# Patient Record
Sex: Female | Born: 1940
Health system: Southern US, Community
[De-identification: ages and names within clinical notes are randomized; demographics above are authoritative.]

## PROBLEM LIST (undated history)

## (undated) DIAGNOSIS — M1990 Unspecified osteoarthritis, unspecified site: Secondary | ICD-10-CM

## (undated) DIAGNOSIS — E785 Hyperlipidemia, unspecified: Secondary | ICD-10-CM

## (undated) DIAGNOSIS — IMO0002 Reserved for concepts with insufficient information to code with codable children: Secondary | ICD-10-CM

## (undated) DIAGNOSIS — C541 Malignant neoplasm of endometrium: Secondary | ICD-10-CM

## (undated) DIAGNOSIS — Z923 Personal history of irradiation: Secondary | ICD-10-CM

## (undated) DIAGNOSIS — I82409 Acute embolism and thrombosis of unspecified deep veins of unspecified lower extremity: Secondary | ICD-10-CM

## (undated) DIAGNOSIS — K219 Gastro-esophageal reflux disease without esophagitis: Secondary | ICD-10-CM

## (undated) DIAGNOSIS — G43909 Migraine, unspecified, not intractable, without status migrainosus: Secondary | ICD-10-CM

## (undated) DIAGNOSIS — F419 Anxiety disorder, unspecified: Secondary | ICD-10-CM

## (undated) DIAGNOSIS — I10 Essential (primary) hypertension: Secondary | ICD-10-CM

## (undated) HISTORY — DX: Hyperlipidemia, unspecified: E78.5

## (undated) HISTORY — PX: CHOLECYSTECTOMY: SHX55

## (undated) HISTORY — DX: Unspecified osteoarthritis, unspecified site: M19.90

## (undated) HISTORY — DX: Acute embolism and thrombosis of unspecified deep veins of unspecified lower extremity: I82.409

## (undated) HISTORY — DX: Migraine, unspecified, not intractable, without status migrainosus: G43.909

## (undated) HISTORY — DX: Malignant neoplasm of endometrium: C54.1

## (undated) HISTORY — DX: Gastro-esophageal reflux disease without esophagitis: K21.9

## (undated) HISTORY — DX: Personal history of irradiation: Z92.3

## (undated) HISTORY — PX: EYE SURGERY: SHX253

---

## 1999-03-20 ENCOUNTER — Encounter: Payer: Self-pay | Admitting: Cardiology

## 1999-03-20 ENCOUNTER — Inpatient Hospital Stay (HOSPITAL_COMMUNITY): Admission: EM | Admit: 1999-03-20 | Discharge: 1999-03-27 | Payer: Self-pay | Admitting: Emergency Medicine

## 1999-03-20 ENCOUNTER — Encounter: Payer: Self-pay | Admitting: Emergency Medicine

## 1999-03-26 ENCOUNTER — Encounter: Payer: Self-pay | Admitting: Endocrinology

## 1999-08-16 ENCOUNTER — Other Ambulatory Visit: Admission: RE | Admit: 1999-08-16 | Discharge: 1999-08-16 | Payer: Self-pay | Admitting: Obstetrics and Gynecology

## 2000-08-15 ENCOUNTER — Other Ambulatory Visit: Admission: RE | Admit: 2000-08-15 | Discharge: 2000-08-15 | Payer: Self-pay | Admitting: Obstetrics and Gynecology

## 2000-09-04 ENCOUNTER — Encounter: Payer: Self-pay | Admitting: Obstetrics and Gynecology

## 2000-09-04 ENCOUNTER — Encounter: Admission: RE | Admit: 2000-09-04 | Discharge: 2000-09-04 | Payer: Self-pay | Admitting: Obstetrics and Gynecology

## 2000-12-06 ENCOUNTER — Encounter: Payer: Self-pay | Admitting: *Deleted

## 2000-12-06 ENCOUNTER — Ambulatory Visit (HOSPITAL_COMMUNITY): Admission: RE | Admit: 2000-12-06 | Discharge: 2000-12-06 | Payer: Self-pay | Admitting: *Deleted

## 2000-12-09 ENCOUNTER — Ambulatory Visit (HOSPITAL_COMMUNITY): Admission: RE | Admit: 2000-12-09 | Discharge: 2000-12-09 | Payer: Self-pay | Admitting: *Deleted

## 2000-12-09 ENCOUNTER — Encounter (INDEPENDENT_AMBULATORY_CARE_PROVIDER_SITE_OTHER): Payer: Self-pay | Admitting: *Deleted

## 2000-12-25 ENCOUNTER — Encounter (INDEPENDENT_AMBULATORY_CARE_PROVIDER_SITE_OTHER): Payer: Self-pay | Admitting: Specialist

## 2000-12-25 ENCOUNTER — Observation Stay (HOSPITAL_COMMUNITY): Admission: RE | Admit: 2000-12-25 | Discharge: 2000-12-26 | Payer: Self-pay | Admitting: General Surgery

## 2001-10-21 ENCOUNTER — Other Ambulatory Visit: Admission: RE | Admit: 2001-10-21 | Discharge: 2001-10-21 | Payer: Self-pay | Admitting: Obstetrics and Gynecology

## 2004-04-03 ENCOUNTER — Ambulatory Visit (HOSPITAL_COMMUNITY): Admission: RE | Admit: 2004-04-03 | Discharge: 2004-04-03 | Payer: Self-pay | Admitting: *Deleted

## 2004-04-03 ENCOUNTER — Encounter (INDEPENDENT_AMBULATORY_CARE_PROVIDER_SITE_OTHER): Payer: Self-pay | Admitting: *Deleted

## 2006-04-29 ENCOUNTER — Encounter: Admission: RE | Admit: 2006-04-29 | Discharge: 2006-04-29 | Payer: Self-pay | Admitting: Gastroenterology

## 2007-09-04 HISTORY — PX: TOTAL ABDOMINAL HYSTERECTOMY W/ BILATERAL SALPINGOOPHORECTOMY: SHX83

## 2008-01-23 ENCOUNTER — Ambulatory Visit (HOSPITAL_COMMUNITY): Admission: RE | Admit: 2008-01-23 | Discharge: 2008-01-23 | Payer: Self-pay | Admitting: Obstetrics and Gynecology

## 2008-02-04 ENCOUNTER — Inpatient Hospital Stay (HOSPITAL_COMMUNITY): Admission: RE | Admit: 2008-02-04 | Discharge: 2008-02-06 | Payer: Self-pay | Admitting: Obstetrics and Gynecology

## 2008-02-04 ENCOUNTER — Encounter (INDEPENDENT_AMBULATORY_CARE_PROVIDER_SITE_OTHER): Payer: Self-pay | Admitting: Obstetrics and Gynecology

## 2009-10-10 ENCOUNTER — Encounter: Admission: RE | Admit: 2009-10-10 | Discharge: 2009-10-10 | Payer: Self-pay | Admitting: Endocrinology

## 2009-11-08 ENCOUNTER — Other Ambulatory Visit: Admission: RE | Admit: 2009-11-08 | Discharge: 2009-11-08 | Payer: Self-pay | Admitting: Gastroenterology

## 2010-09-24 ENCOUNTER — Encounter: Payer: Self-pay | Admitting: Gastroenterology

## 2010-09-25 ENCOUNTER — Ambulatory Visit (HOSPITAL_COMMUNITY)
Admission: RE | Admit: 2010-09-25 | Discharge: 2010-09-25 | Payer: Self-pay | Source: Home / Self Care | Attending: Family Medicine | Admitting: Family Medicine

## 2010-09-26 LAB — CREATININE, SERUM
Creatinine, Ser: 0.86 mg/dL (ref 0.4–1.2)
GFR calc Af Amer: >60 mL/min (ref >60)
GFR calc non Af Amer: >60 mL/min (ref >60)

## 2010-09-26 LAB — BUN: BUN: 11 mg/dL (ref 6–23)

## 2010-10-10 ENCOUNTER — Other Ambulatory Visit: Payer: Self-pay | Admitting: Endocrinology

## 2010-10-10 ENCOUNTER — Ambulatory Visit
Admission: RE | Admit: 2010-10-10 | Discharge: 2010-10-10 | Disposition: A | Discharge status: Home / Self Care | Payer: Medicare Other | Source: Ambulatory Visit | Attending: Endocrinology | Admitting: Endocrinology

## 2010-10-10 DIAGNOSIS — R030 Elevated blood-pressure reading, without diagnosis of hypertension: Secondary | ICD-10-CM

## 2011-01-16 NOTE — Op Note (Signed)
NAMEADRIE, [name]                ACCOUNT NO.:  [id number]   MEDICAL RECORD NO.:  [id number]          PATIENT TYPE:  INP   LOCATION:  9309                          FACILITY:  [facility]   PHYSICIAN:  Malachi Pro. Ambrose Mantle, M.D. DATE OF BIRTH:  12/27/1940   DATE OF PROCEDURE:  02/04/2008  DATE OF DISCHARGE:                               OPERATIVE REPORT   PREOPERATIVE DIAGNOSIS:  Endometrial carcinoma, FIGO grade 1.   POSTOPERATIVE DIAGNOSIS:  No evidence of deep myometrial invasion.   OPERATION:  Abdominal hysterectomy, bilateral salpingo-oophorectomy,  pelvic washings.   OPERATOR:  Malachi Pro. Ambrose Mantle, MD   ASSISTANT:  Zenaida Niece, MD.   ANESTHESIA:  General.   The patient was brought to the operating room and given general  anesthesia.  She was placed in frog-leg position.  The abdomen was  prepped all the way to the xiphoid for midline incision.  The prep was  done with Betadine solution.  The vagina was prepped.  The urethra was  prepped and a Foley catheter was inserted to straight drain.  The  patient was then placed supine.  The abdomen was draped as a sterile  field.  Because there was a possibility that lymph node dissection would  be necessary, a midline incision was made and carried in layers through  the skin, subcutaneous tissue, and fascia.  The fascia was then divided  in the midline.  Posterior sheath was divided.  The peritoneum was  opened vertically.  Exploration of the upper abdomen revealed the liver  to be smooth.  The gallbladder was absent.  The lower pole of both  kidneys were felt to be normal.  There was no sign of any intra-  abdominal metastasis.  There was no free fluid.  Pelvic washings were  obtained and preserved.  Packs were used to try to prepare the operative  field and after I placed about 3 packs, the patient sustained a  bradycardia down to about 38 beats a minute.  She was given atropine.  The packing was stopped, and for several minutes, we  waited the  heartbeat to return into the mid 50s, Dr. Tacy Dura then gave the  permission to proceed with the packing.  Remainder of the packing was  done.  The uterus was anterior normal sized.  Kelly clamps were placed  across both upper pedicles.  The round ligaments were divided with the  Bovie, and bladder flap was developed.  The infundibulopelvic ligaments  were isolated with a right angle clamp and then divided between clamps  and doubly suture ligated.  The uterine vessels bilaterally were  skeletonized, clamped, cut, and suture ligated.  Parametrium tissues  were clamped, cut, and suture ligated.  Uterosacral ligaments were  clamped, cut, suture ligated, and held, and the clamps were then placed  across the upper portion of the vagina and the uterus, tubes, and  ovaries were removed as a surgical specimen.  Prior to closure of the  vagina, there was dark bloody material that had come from the inside of  the uterus.  After vaginal angle sutures were  placed and tied, the  central portion of the vagina was reapproximated with figure-of-eight  sutures of 0 Vicryl.  There was no significant bleeding.  A couple spots  needed to be touched with the Bovie.  Uterosacral ligaments were sutured  together in the midline.  Liberal irrigation confirmed hemostasis.  I  palpated both ureters, they were normal and Dr. Dierdre Searles was called from  [facility] to come do a frozen section after the specimen had been  removed.  All packs and retractors were removed.  The abdominal wall was  then closed with interrupted Marcello Moores sutures of #1 Novofil  incorporating the subcutaneous tissue, the rectus muscle, the fascia,  and the peritoneum in the first layer, and then the second layer with  just the fascia.  After these were placed and tied, there was several  gaps in the fascia that I reenforced with interrupted sutures of #1  Novofil.  At one point during the closure, I did touch the surface of  the bowel  with the #1 Novofil needle, but I inspected it thoroughly and  there was no entry into the bowel lumen, and there was no significant  serosal tear.  I liberally irrigated the subcutaneous tissue, closed it  with a running 3-0 Vicryl suture and the skin was reapproximated with  the staples.  Dr. Dierdre Searles had called prior to the finishing the closure and  stated that in the 2 slides that she actually looked at, there was no  evidence of true endometrial cancer, just complex endometrial  hyperplasia, but at the very least, there was no deep myometrial  invasion, so I called Dr. Georgina Pillion and told him that we did not need the  node dissection.  Procedure was terminated, and the patient was returned  to recovery in satisfactory condition.  Sponge and needle counts were  correct.  Blood loss was thought to be about less than 100 mL.      Malachi Pro. Ambrose Mantle, M.D.  Electronically Signed     TFH/MEDQ  D:  02/04/2008  T:  02/05/2008  Job:  [id number]   cc:   Veverly Fells. Altheimer, M.D.  Fax: [phone]

## 2011-01-16 NOTE — H&P (Signed)
[name], [name]                ACCOUNT NO.:  [id number]   MEDICAL RECORD NO.:  [id number]           PATIENT TYPE:   LOCATION:                                 FACILITY:   PHYSICIAN:  [name], M.D. DATE OF BIRTH:  1941-08-21   DATE OF ADMISSION:  02/04/2008  DATE OF DISCHARGE:                              HISTORY & PHYSICAL   DATE OF ADMISSION:  February 04, 2008.   This is a 70 year old, white, single female, para 0, who is admitted to  the hospital for abdominal hysterectomy, bilateral salpingo-  oophorectomy, possible lymph node dissection, because of endometrial  adenocarcinoma.  This patient has had no postmenopausal bleeding.  She  came to my office on Jan 06, 2008 for a yearly exam, and she stated that  from March 18 to April 20, she had had lower abdominal and back pain,  and she felt like her bottom was falling out.  She said the pain had  improved.  She had not had sexual intercourse.  Examination revealed a  significant cystocele.  The uterus was hard to feel.  It was thought to  be small.  I advised a pelvic ultrasound, which she underwent in our  office on Jan 09, 2008.  This showed normal ovaries bilaterally, normal  sized uterus with a large component in the endometrial cavity consistent  with fluid.  There was an echogenic soft tissue vascular mass seen along  the posterior interior cavity that measured 3 x 2.6 x 1.6 cm.  There  appeared to be small calcifications within the mass.  There were two  other soft tissue foci on the posterior wall of the uterus measuring 14  x 5 and 10 x 4 mm.  There was no fluid present.  The patient returned to  my office on Jan 13, 2008 for an endometrial biopsy.  The cervix was  stenotic, but it was dilated with a 7, 8 and 9-10 dilators.  Approximately 15 Pipelle with what appeared to be purulent material were  removed and then a Vabra aspiration of the endometrial cavity was done.  Culture of the material was sent and I placed her  on doxicycline 100 mg  twice daily.  The pathology report returned as endometrioid  adenocarcinoma with extensive necrosis, FIGO grade I.  I went over the  material with [name] at [name], and instead of the fluid  being a pyometra, it was actually necrotic fluid.  Because the patient  had had cervical stenosis and had not had any bleeding, I advised her to  be evaluated with a CT scan to see if there was evidence of  intraabdominal spread.  CT scan of the abdomen and pelvis showed no  acute intraabdominal finding, no intraabdominal metastasis was  identified, no lymphadenopathy was seen.  There was no evidence of  ascites.  A high density material in the uterine fundus might represent  a calcified fibroid.  There was a prominent fluid density within the  endometrial canal.  The ovaries were unremarkable.  Large bowel, small  bowel and appendix  were normal.  No lytic lesions were noted.  Because  the patient has known endometrial cancer, she is a candidate for lymph  node dissection if the cancer is spread more than halfway through the  myometrium.  I have therefore asked [name] to be available to do  a pelvic and paraaortic lymphadenectomy if indicated.  I offered to him  to see the patient preoperatively, but he stated that he did not think  that was necessary.   PAST MEDICAL HISTORY:  Reveals no known allergies.  She is not allergic  to latex.  In approximately 2000, she broke her leg and later developed  a pulmonary embolus.  She was treated with Coumadin for some time, but  has been off Coumadin for a long time.  She also has osteoporosis.   OPERATIONS:  The patient had her gallbladder removed in 2001.   She does not drink.  She does not smoke.  She is retired from the tax  department.   FAMILY HISTORY:  Mother died from congestive heart failure.  Father had  a bleeding ulcer.  A brother has high blood pressure at age 22.   MEDICATIONS:  1. Actonel 35 mg  daily.  2. Crestor 20 mg daily.  3. Glucosamine 1500 mg daily.  4. Inderal LA 120 mg daily.  5. Caltrate and vitamin D.  6. Oxycodone as needed for migraines.   PHYSICAL EXAMINATION:  Well developed, well nourished, white female in  no distress.  Blood pressure is 140/90, pulse is 60.  Weight is 182  pounds.  HEAD, EYES, EARS, NOSE & THROAT:  Reveal no cranial abnormalities.  Extraocular movements are intact.  Nose and pharynx are clear.  NECK:  Supple without thyromegaly.  HEART:  Normal size and sounds.  No murmurs.  LUNGS:  Clear to auscultation.  BREASTS:  Soft without masses.  ABDOMEN:  Soft, not distended.  It is obese.  Liver, spleen and kidneys  are not felt.  No masses are palpable.  Pap smear on Jan 06, 2008 was normal.  The vulva and vagina are clean.  There is a third degree cystocele.  The uterine cervix is without  lesions.  The uterus is difficult to feel, but is thought to be anterior  and normal size.  The adnexa are free of masses.  RECTAL EXAM:  Negative.   ADMITTING IMPRESSION:  1. Endometrial adenocarcinoma.  2. Osteoporosis.  3. History of pulmonary embolism.  4. Migraine headaches.   The patient is admitted for abdominal hysterectomy, bilateral salpingo-  oophorectomy and possible pelvic and para-aortic lymphadenectomy.  She  has been informed of the risks of surgery including, but not limited to,  heart attack, stroke, pulmonary embolus, wound disruption, hemorrhage  with need for reoperation and/or transfusion, fistula formation, nerve  injury, intestinal obstruction.  She has been advised that we will use  low dose heparin, TED hose, pulsatile antiembolism stockings and early  ambulation.  She understands and agrees to proceed.      [name], M.D.  Electronically Signed     TFH/MEDQ  D:  02/03/2008  T:  02/03/2008  Job:  [id number]   cc:   [name], M.D.  Fax: [phone]

## 2011-01-16 NOTE — Discharge Summary (Signed)
[name], STEINHILBER                ACCOUNT NO.:  [id number]   MEDICAL RECORD NO.:  [id number]          PATIENT TYPE:  INP   LOCATION:  9309                          FACILITY:  [facility]   PHYSICIAN:  Malachi Pro. Ambrose Mantle, M.D. DATE OF BIRTH:  11-06-1940   DATE OF ADMISSION:  02/04/2008  DATE OF DISCHARGE:  02/06/2008                               DISCHARGE SUMMARY   HISTORY OF PRESENT ILLNESS:  This is a 70 year old white female who was  admitted to the hospital for surgical removal of the uterus, tubes, and  ovaries because of endometrial carcinoma.  Dr. Brunilda Payor was available in  case a lymph node dissection was necessary.  The patient underwent the  abdominal hysterectomy and bilateral salpingo-oophorectomy by Dr. Ambrose Mantle  with Dr. Meisinger's assisting under general anesthesia.  Blood loss was  less than 100 mL.  The frozen section report by Dr. Dierdre Searles was there was no  definite remaining endometrial carcinoma, but at the very least, there  was no myometrial invasion.  Postoperatively, the patient did well.  She  had no significant temperature elevation.  Highest temperature was  100.1.  On the morning of the first postop day, she tolerated a regular  diet.  She ambulated without difficulty and on the second postop day was  discharged.  Her incision is somewhat ecchymotic, but it is healing  satisfactorily.  Her abdomen is soft and nontender.   LABORATORY DATA:  Initial hemoglobin of 14.6, hematocrit 42.1, white  count 7500, and platelet count 215,000.  Followup hemoglobin is 14.1 and  13.8.  Differential was normal.  Comprehensive metabolic profile was  normal except for glucose of 100, alkaline phosphatase of 33, and CA-125  was 15.5.  Estimated glomerular filtration rate was greater than 60.  Path report showed peritoneal washings negative for malignant cells.  There was endometrial cancer residual in the hysterectomy specimen, but  the greatest depth of penetration was 2 mm when the uterine  myometrial  wall was greater than 1 cm in thickness.  Therefore, the tumor is  categorized as an endometrial carcinoma stage IB.   FINAL DIAGNOSES:  1. Endometrial adenocarcinoma stage IB.  2. Osteoporosis.  3. History of pulmonary embolism.  4. Migraine headaches.   OPERATION:  1. Abdominal hysterectomy.  2. Bilateral salpingo-oophorectomy.   FINAL CONDITION:  Improved.   INSTRUCTIONS:  No vaginal entrance.  No heavy lifting or strenuous  activity.  Call with any temperature elevation greater than 100.4  degrees.  Call with any heavy vaginal bleeding or with any unusual  problems.  I have advised the patient that if she has not passed flatus  in 2 days, she should take a suppository.  She is to return to see me in  1 week for follow up examination.      Malachi Pro. Ambrose Mantle, M.D.  Electronically Signed     TFH/MEDQ  D:  02/06/2008  T:  02/06/2008  Job:  [id number]   cc:   Veverly Fells. Altheimer, M.D.  Fax: [phone]

## 2011-01-19 NOTE — Op Note (Signed)
[facility]  Patient:    [name], [name]                       MRN: [id number] Proc. Date: 12/25/00 Adm. Date:  [id number] Attending:  Carson Myrtle                           Operative Report  PREOPERATIVE DIAGNOSES:  Chronic cholecystolithiasis.  POSTOPERATIVE DIAGNOSES:  Chronic cholecystolithiasis.  PROCEDURE:  Laparoscopic cholecystectomy.  SURGEON:  Kendrick Ranch, M.D.  ANESTHESIA:  General.  ASSISTANT:  Ginette Pitman, M.D.  HISTORY OF PRESENT ILLNESS:  This is a 70 year old Caucasian female with chronic cholecystitis frequent food related attacks of right upper quadrant pain and nausea. Ultrasound confirms gallstones, liver function studies normal. She is anxious to proceed with a laparoscopic cholecystectomy as it has been carefully explained.  DESCRIPTION OF PROCEDURE:  The patient is brought to the operating room and placed supine. General endotracheal anesthesia administered. The abdomen scrubbed, prepped and draped in the usual fashion. Marcaine 0.5% with epinephrine was used prior to each surgical incision. A horizontal infraumbilical incision, the fascia identified, opened vertically. A small periumbilical hernia was also noted and included in the incision. A suture was placed. The peritoneum entered without complications. A Hasson catheter placed, tied in place, the abdomen insufflated. General peritoneoscopy was essentially negative. Questionable abnormality in the left lower quadrant was noted. A second 10 mm trocar was placed in the mid epigastrium, two 5 mm trocars in the right upper quadrant. Instruments were applied. The patient was placed in the head down position and the pelvis was inspected. The questionable abnormality was a hydrosalpinx on the left, a normal appearing ovary on the right. The patient was then repositioned and attention was turned to the gallbladder. It was chronically inflamed and of a cork screw  general anatomy. It was grasped and straightened and then appeared normal. Its base was carefully explored. The cystic duct and artery were identified clearly. There were no other structures. The cystic duct was triply and divided, likewise the artery triply clipped and divided. The gallbladder was then removed from the gallbladder bed without incident or complication. There was no bile or no blood. Irrigant was clear. All areas inspected and were found to be negative for problems. The gallbladder was then removed through the infraumbilical incision. Again the area inspected. All irrigant, CO2, instruments and trocars removed. At the infraumbilical incision, a second suture was then carefully placed in the fascia in the area of the small hernia. This snugly closed the fascia. Additional local anesthesia was placed in the infraumbilical area and then all skin incisions were closed with 3-0 monocryl. Steri-Strips and dry sterile dressings applied. She tolerated it well and was removed to the recovery room in good condition. DD:  12/25/00 TD:  12/25/00 Job: [id number]

## 2011-05-28 ENCOUNTER — Other Ambulatory Visit (HOSPITAL_COMMUNITY): Payer: Self-pay | Admitting: Obstetrics and Gynecology

## 2011-05-28 DIAGNOSIS — C541 Malignant neoplasm of endometrium: Secondary | ICD-10-CM

## 2011-05-31 LAB — CBC
HCT: 40.4
HCT: 40.5
HCT: 42.1
Hemoglobin: 13.8
Hemoglobin: 14.1
MCHC: 34.2
MCHC: 34.6
MCV: 96.3
MCV: 97.4
Platelets: 215
RDW: 12.9
WBC: 16 — ABNORMAL HIGH
WBC: 7.5

## 2011-05-31 LAB — COMPREHENSIVE METABOLIC PANEL
Albumin: 3.8
BUN: 10
Calcium: 9
Chloride: 105
Creatinine, Ser: 0.73
GFR calc non Af Amer: >60
Total Bilirubin: 0.9

## 2011-05-31 LAB — DIFFERENTIAL
Basophils Absolute: 0
Lymphocytes Relative: 36
Monocytes Absolute: 0.3
Neutro Abs: 4.4

## 2011-06-04 ENCOUNTER — Encounter (HOSPITAL_COMMUNITY)
Admission: RE | Admit: 2011-06-04 | Discharge: 2011-06-04 | Disposition: A | Discharge status: Home / Self Care | Payer: Medicare Other | Source: Ambulatory Visit | Attending: Obstetrics and Gynecology | Admitting: Obstetrics and Gynecology

## 2011-06-04 DIAGNOSIS — C541 Malignant neoplasm of endometrium: Secondary | ICD-10-CM

## 2011-06-04 DIAGNOSIS — Z90710 Acquired absence of both cervix and uterus: Secondary | ICD-10-CM | POA: Insufficient documentation

## 2011-06-04 DIAGNOSIS — M4120 Other idiopathic scoliosis, site unspecified: Secondary | ICD-10-CM | POA: Insufficient documentation

## 2011-06-04 DIAGNOSIS — R599 Enlarged lymph nodes, unspecified: Secondary | ICD-10-CM | POA: Insufficient documentation

## 2011-06-04 DIAGNOSIS — M479 Spondylosis, unspecified: Secondary | ICD-10-CM | POA: Insufficient documentation

## 2011-06-04 DIAGNOSIS — Z79899 Other long term (current) drug therapy: Secondary | ICD-10-CM | POA: Insufficient documentation

## 2011-06-04 DIAGNOSIS — C499 Malignant neoplasm of connective and soft tissue, unspecified: Secondary | ICD-10-CM | POA: Insufficient documentation

## 2011-06-04 MED ORDER — FLUDEOXYGLUCOSE F - 18 (FDG) INJECTION
19.8000 | Freq: Once | INTRAVENOUS | Status: AC | PRN
  Administered 2011-06-04: 19.8 via INTRAVENOUS

## 2011-06-08 ENCOUNTER — Other Ambulatory Visit: Payer: Self-pay | Admitting: Gynecology

## 2011-06-08 ENCOUNTER — Ambulatory Visit: Payer: Medicare Other | Attending: Gynecology | Admitting: Gynecology

## 2011-06-08 DIAGNOSIS — Z8542 Personal history of malignant neoplasm of other parts of uterus: Secondary | ICD-10-CM | POA: Insufficient documentation

## 2011-06-08 DIAGNOSIS — C775 Secondary and unspecified malignant neoplasm of intrapelvic lymph nodes: Secondary | ICD-10-CM | POA: Insufficient documentation

## 2011-06-08 DIAGNOSIS — Z90710 Acquired absence of both cervix and uterus: Secondary | ICD-10-CM | POA: Insufficient documentation

## 2011-06-08 DIAGNOSIS — C7982 Secondary malignant neoplasm of genital organs: Secondary | ICD-10-CM | POA: Insufficient documentation

## 2011-06-08 DIAGNOSIS — E785 Hyperlipidemia, unspecified: Secondary | ICD-10-CM | POA: Insufficient documentation

## 2011-06-08 DIAGNOSIS — Z9079 Acquired absence of other genital organ(s): Secondary | ICD-10-CM | POA: Insufficient documentation

## 2011-06-08 DIAGNOSIS — Z9089 Acquired absence of other organs: Secondary | ICD-10-CM | POA: Insufficient documentation

## 2011-06-08 LAB — CBC
Hemoglobin: 13.6 g/dL (ref 12.0–15.0)
MCH: 32.1 pg (ref 26.0–34.0)
MCHC: 33.4 g/dL (ref 30.0–36.0)
Platelets: 225 10*3/uL (ref 150–400)

## 2011-06-08 LAB — DIFFERENTIAL
Basophils Relative: 1 % (ref 0–1)
Eosinophils Absolute: 0.1 10*3/uL (ref 0.0–0.7)
Monocytes Absolute: 0.4 10*3/uL (ref 0.1–1.0)
Monocytes Relative: 6 % (ref 3–12)

## 2011-06-08 LAB — COMPREHENSIVE METABOLIC PANEL
ALT: 17 U/L (ref 0–35)
Calcium: 9.6 mg/dL (ref 8.4–10.5)
GFR calc Af Amer: >90 mL/min (ref >90)
Glucose, Bld: 98 mg/dL (ref 70–99)
Sodium: 138 mEq/L (ref 135–145)
Total Protein: 6.3 g/dL (ref 6.0–8.3)

## 2011-06-11 ENCOUNTER — Ambulatory Visit
Admission: RE | Admit: 2011-06-11 | Discharge: 2011-06-11 | Disposition: A | Discharge status: Home / Self Care | Payer: Medicare Other | Source: Ambulatory Visit | Attending: Radiation Oncology | Admitting: Radiation Oncology

## 2011-06-11 DIAGNOSIS — C7982 Secondary malignant neoplasm of genital organs: Secondary | ICD-10-CM | POA: Insufficient documentation

## 2011-06-11 DIAGNOSIS — G43909 Migraine, unspecified, not intractable, without status migrainosus: Secondary | ICD-10-CM | POA: Insufficient documentation

## 2011-06-11 DIAGNOSIS — C549 Malignant neoplasm of corpus uteri, unspecified: Secondary | ICD-10-CM | POA: Insufficient documentation

## 2011-06-11 DIAGNOSIS — E785 Hyperlipidemia, unspecified: Secondary | ICD-10-CM | POA: Insufficient documentation

## 2011-06-11 DIAGNOSIS — C775 Secondary and unspecified malignant neoplasm of intrapelvic lymph nodes: Secondary | ICD-10-CM | POA: Insufficient documentation

## 2011-06-11 DIAGNOSIS — Z510 Encounter for antineoplastic radiation therapy: Secondary | ICD-10-CM | POA: Insufficient documentation

## 2011-06-11 DIAGNOSIS — Z79899 Other long term (current) drug therapy: Secondary | ICD-10-CM | POA: Insufficient documentation

## 2011-06-11 NOTE — Consult Note (Signed)
[name], [name] NO.:  [id number]  MEDICAL RECORD NO.:  [id number]  LOCATION:  GYN                          FACILITY:  [facility]  PHYSICIAN:  De Blanch, M.D.DATE OF BIRTH:  08-26-1941  DATE OF CONSULTATION:  06/08/2011 DATE OF DISCHARGE:                                CONSULTATION   CHIEF COMPLAINT:  Recurrent endometrial cancer.  HISTORY OF PRESENT ILLNESS:  A 70 year old white single female seen in consultation at the request of Dr. Ambrose Mantle regarding management of a newly diagnosed recurrent endometrial carcinoma.  The patient initially underwent surgery on February 04, 2008, for an endometrial carcinoma.  Final pathology showed a well-differentiated endometrial carcinoma with focal superficial myometrial invasion and uterine fibroids.  Peritoneal washings and adnexa and cervix were free of any disease (stage IA, grade 1).  The patient has been followed by Dr. Ambrose Mantle until recently when a nodule was discovered at the upper vagina.  Pap smears have been negative, but a biopsy ultimately demonstrated recurrent adenocarcinoma (May 18, 2011).  The patient had minimal spotting and no pain.  She has had no constitutional symptoms.  She has had a PET scan as further evaluation on October 1st, which shows hypermetabolic pelvic lymph nodes.  A right external iliac node measures 1.9 cm and a left posterior obturator node measured approximately 0.5 cm.  There are no hypermetabolic and inguinal nodes or any other hypermetabolic areas outside of the pelvis.  PAST MEDICAL HISTORY/MEDICAL ILLNESSES: 1. Migraines. 2. Hyperlipidemia.  PAST SURGICAL HISTORY:  TAH-BSO in 2009, cholecystectomy.  CURRENT MEDICATIONS:  Crestor, propranolol, Endocet, vitamin D, Caltrate, Tums.  DRUG ALLERGIES:  None.  FAMILY HISTORY:  Negative for gynecologic, breast, or colon cancer.  SOCIAL HISTORY:  The patient is retired.  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  She does not smoke.  She comes accompanied by a friend.  She lives alone.  REVIEW OF SYSTEMS:  10-point comprehensive review of systems negative except as noted above.  PHYSICAL EXAMINATION:  VITAL SIGNS:  Weight 181 pounds, height 5 feet 4 inches, blood pressure 110/74. GENERAL:  The patient is a healthy white female in no acute distress. HEENT:  Negative. NECK:  Supple without thyromegaly.  There is no supraclavicular or inguinal adenopathy. ABDOMEN:  Soft, nontender.  No mass, organomegaly, ascites, or hernias noted. PELVIC:  EGBUS is normal.  Vagina is normal up to the apex.  In the posterior apex of the vagina, is a 2 cm exophytic lesion, which is quite friable.  On bimanual exam, the lesion is easily palpated. RECTOVAGINAL:  Confirms.  I do not feel any sidewall masses.  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  I discussed these findings with the patient and her friend.  I would recommend that the patient be treated with whole pelvis radiation therapy followed by intravaginal brachytherapy.  The overall course of therapy was described to the patient and her friend.  We will arrange for her to see Dr. Roselind Messier for simulation, further consultation, and treatment planning.  We will also obtain a CA-125 to see whether we might use  this as a tumor marker.     De Blanch, M.D.     DC/MEDQ  D:  06/08/2011  T:  06/09/2011  Job:  [id number]  cc:   Malachi Pro. Ambrose Mantle, M.D. Fax: [phone]  Veverly Fells. Altheimer, M.D. Fax: [phone]  Telford Nab, R.N. [address] [place], [place] [address]  Billie Lade, Ph.D., M.D. Fax: [phone]  Electronically Signed by De Blanch M.D. on 06/11/2011 09:13:21 AM

## 2011-07-09 ENCOUNTER — Ambulatory Visit
Admission: RE | Admit: 2011-07-09 | Discharge: 2011-07-09 | Disposition: A | Discharge status: Home / Self Care | Payer: Medicare Other | Source: Ambulatory Visit | Attending: Radiation Oncology | Admitting: Radiation Oncology

## 2011-07-09 DIAGNOSIS — Z510 Encounter for antineoplastic radiation therapy: Secondary | ICD-10-CM | POA: Insufficient documentation

## 2011-07-09 DIAGNOSIS — C549 Malignant neoplasm of corpus uteri, unspecified: Secondary | ICD-10-CM | POA: Insufficient documentation

## 2011-07-10 ENCOUNTER — Ambulatory Visit
Admission: RE | Admit: 2011-07-10 | Discharge: 2011-07-10 | Disposition: A | Discharge status: Home / Self Care | Payer: Medicare Other | Source: Ambulatory Visit | Attending: Radiation Oncology | Admitting: Radiation Oncology

## 2011-07-10 NOTE — Progress Notes (Signed)
CC:   De Blanch, M.D. Malachi Pro. Ambrose Mantle, M.D. Veverly Fells. Altheimer, M.D.  DIAGNOSIS:  Recurrent endometrial cancer.  NARRATIVE:  Mrs. Bossman is seen today for weekly assessment.  She has completed her 1st radiation therapy directed to the pelvis region.  The patient is tolerating her radiation treatments well thus far without any side effects.  Cumulative dose is 180 cGy of a planned 5040 cGy external beam treatments.  PHYSICAL EXAMINATION:  Vital Signs:  The patient's weight is 182.9 pounds.  Lungs:  Clear.  Heart:  Regular rhythm and rate.  Abdomen: Soft and nontender with normal bowel sounds.  IMPRESSION AND PLAN:  The patient is tolerating her radiation treatments well at this time.  The patient's radiation fields are setting up accurately.  The patient's radiation chart was checked today.  The plan is to continue to a cumulative external beam dose of 5040 cGy followed by intracavitary brachytherapy.    ______________________________ Billie Lade, Ph.D., M.D. JDK/MEDQ  D:  07/09/2011  T:  07/09/2011  Job:  [id number]

## 2011-07-11 ENCOUNTER — Ambulatory Visit
Admission: RE | Admit: 2011-07-11 | Discharge: 2011-07-11 | Disposition: A | Discharge status: Home / Self Care | Payer: Medicare Other | Source: Ambulatory Visit | Attending: Radiation Oncology | Admitting: Radiation Oncology

## 2011-07-12 ENCOUNTER — Ambulatory Visit
Admission: RE | Admit: 2011-07-12 | Discharge: 2011-07-12 | Disposition: A | Discharge status: Home / Self Care | Payer: Medicare Other | Source: Ambulatory Visit | Attending: Radiation Oncology | Admitting: Radiation Oncology

## 2011-07-13 ENCOUNTER — Ambulatory Visit
Admission: RE | Admit: 2011-07-13 | Discharge: 2011-07-13 | Disposition: A | Discharge status: Home / Self Care | Payer: Medicare Other | Source: Ambulatory Visit | Attending: Radiation Oncology | Admitting: Radiation Oncology

## 2011-07-16 ENCOUNTER — Ambulatory Visit
Admission: RE | Admit: 2011-07-16 | Discharge: 2011-07-16 | Disposition: A | Discharge status: Home / Self Care | Payer: Medicare Other | Source: Ambulatory Visit | Attending: Radiation Oncology | Admitting: Radiation Oncology

## 2011-07-16 NOTE — Progress Notes (Signed)
Slight fatigue, had some nausea a few days ago, no nausea now, on [facility] Saturday,, no c/o pain

## 2011-07-16 NOTE — Progress Notes (Signed)
[facility] Radiation Oncology  Weekly Treatment Note  Dx: Recurrent Endometrial Cancer   Name: [name] Date: 07/16/2011 MRN: [id number] DOB: 11/01/1940  Status:outpatient    Current dose: 1080  Current fraction:6  Planned dose:5040  Planned fraction:28    ALLERGIES: Review of patient's allergies indicates no known allergies.       NARRATIVE: [name] was seen today for weekly treatment management. The chart was checked and port films images were reviewed.  PHYSICAL EXAMINATION: weight is 181 lb 1.3 oz (82.137 kg).   Lungs clear, Abdomen soft.    ASSESSMENT: Patient tolerating treatments well except for intermittent nausea.  Pt. Does not feel she needs nausea med at this time.    PLAN: Continue treatment as planned.

## 2011-07-17 ENCOUNTER — Ambulatory Visit
Admission: RE | Admit: 2011-07-17 | Discharge: 2011-07-17 | Disposition: A | Discharge status: Home / Self Care | Payer: Medicare Other | Source: Ambulatory Visit | Attending: Radiation Oncology | Admitting: Radiation Oncology

## 2011-07-18 ENCOUNTER — Ambulatory Visit
Admission: RE | Admit: 2011-07-18 | Discharge: 2011-07-18 | Disposition: A | Discharge status: Home / Self Care | Payer: Medicare Other | Source: Ambulatory Visit | Attending: Radiation Oncology | Admitting: Radiation Oncology

## 2011-07-19 ENCOUNTER — Ambulatory Visit
Admission: RE | Admit: 2011-07-19 | Discharge: 2011-07-19 | Disposition: A | Discharge status: Home / Self Care | Payer: Medicare Other | Source: Ambulatory Visit | Attending: Radiation Oncology | Admitting: Radiation Oncology

## 2011-07-20 ENCOUNTER — Ambulatory Visit
Admission: RE | Admit: 2011-07-20 | Discharge: 2011-07-20 | Disposition: A | Discharge status: Home / Self Care | Payer: Medicare Other | Source: Ambulatory Visit | Attending: Radiation Oncology | Admitting: Radiation Oncology

## 2011-07-21 ENCOUNTER — Ambulatory Visit
Admission: RE | Admit: 2011-07-21 | Discharge: 2011-07-21 | Disposition: A | Discharge status: Home / Self Care | Payer: Medicare Other | Source: Ambulatory Visit | Attending: Radiation Oncology | Admitting: Radiation Oncology

## 2011-07-23 ENCOUNTER — Ambulatory Visit
Admission: RE | Admit: 2011-07-23 | Discharge: 2011-07-23 | Disposition: A | Discharge status: Home / Self Care | Payer: Medicare Other | Source: Ambulatory Visit | Attending: Radiation Oncology | Admitting: Radiation Oncology

## 2011-07-23 VITALS — Wt 181.0 lb

## 2011-07-23 DIAGNOSIS — C549 Malignant neoplasm of corpus uteri, unspecified: Secondary | ICD-10-CM

## 2011-07-23 NOTE — Progress Notes (Signed)
Pt c/o feeling bloated this weekend, and growling", slight fatigue,  9:43 AM

## 2011-07-23 NOTE — Progress Notes (Signed)
DIAGNOSIS:  Recurrent endometrial cancer.  NARRATIVE:  [name] is seen today for weekly assessment.  She has completed 12 of 28 external beam treatments (2,160 cGy of a planned and 5,040 cGy).  The patient does complain of bloating this week and I have recommended she obtain simethicone to be used for this issue.  The patient has had some intermittent diarrhea and is using Imodium for this issue.  The patient denies any vaginal bleeding or urination difficulties at this time.  PHYSICAL EXAMINATION:  Vital Signs:  The patient's weight is stable at 181 pounds.  Lungs:  Clear.  Heart:  Regular rhythm and rate.  Abdomen: Soft and nontender with normal bowel sounds.  IMPRESSION AND PLAN:  The patient is tolerating her treatments reasonably well, except for issues as above.  The patient's radiation fields are setting up accurately.  The patient's radiation chart was checked today.  The plan is to continue with 16 additional external beam treatments followed by intracavitary brachytherapy treatments.    ______________________________ Billie Lade, Ph.D., M.D. JDK/MEDQ  D:  07/23/2011  T:  07/23/2011  Job:  [id number]

## 2011-07-24 ENCOUNTER — Ambulatory Visit
Admission: RE | Admit: 2011-07-24 | Discharge: 2011-07-24 | Disposition: A | Discharge status: Home / Self Care | Payer: Medicare Other | Source: Ambulatory Visit | Attending: Radiation Oncology | Admitting: Radiation Oncology

## 2011-07-25 ENCOUNTER — Ambulatory Visit
Admission: RE | Admit: 2011-07-25 | Discharge: 2011-07-25 | Disposition: A | Discharge status: Home / Self Care | Payer: Medicare Other | Source: Ambulatory Visit | Attending: Radiation Oncology | Admitting: Radiation Oncology

## 2011-07-30 ENCOUNTER — Ambulatory Visit
Admission: RE | Admit: 2011-07-30 | Discharge: 2011-07-30 | Disposition: A | Discharge status: Home / Self Care | Payer: Medicare Other | Source: Ambulatory Visit | Attending: Radiation Oncology | Admitting: Radiation Oncology

## 2011-07-31 ENCOUNTER — Ambulatory Visit
Admission: RE | Admit: 2011-07-31 | Discharge: 2011-07-31 | Disposition: A | Discharge status: Home / Self Care | Payer: Medicare Other | Source: Ambulatory Visit | Attending: Radiation Oncology | Admitting: Radiation Oncology

## 2011-08-01 ENCOUNTER — Ambulatory Visit
Admission: RE | Admit: 2011-08-01 | Discharge: 2011-08-01 | Disposition: A | Discharge status: Home / Self Care | Payer: Medicare Other | Source: Ambulatory Visit | Attending: Radiation Oncology | Admitting: Radiation Oncology

## 2011-08-02 ENCOUNTER — Ambulatory Visit
Admission: RE | Admit: 2011-08-02 | Discharge: 2011-08-02 | Disposition: A | Discharge status: Home / Self Care | Payer: Medicare Other | Source: Ambulatory Visit | Attending: Radiation Oncology | Admitting: Radiation Oncology

## 2011-08-03 ENCOUNTER — Ambulatory Visit
Admission: RE | Admit: 2011-08-03 | Discharge: 2011-08-03 | Disposition: A | Discharge status: Home / Self Care | Payer: Medicare Other | Source: Ambulatory Visit | Attending: Radiation Oncology | Admitting: Radiation Oncology

## 2011-08-03 VITALS — Wt 182.2 lb

## 2011-08-03 DIAGNOSIS — C549 Malignant neoplasm of corpus uteri, unspecified: Secondary | ICD-10-CM

## 2011-08-03 NOTE — Progress Notes (Signed)
No c/o diarrhea, or skin irritation to her bottom, doing well,  Eating and drinking well, not taking the gas-ex  otc as recommended,stated"i don't need it right now" 9:12 AM

## 2011-08-06 ENCOUNTER — Ambulatory Visit
Admission: RE | Admit: 2011-08-06 | Discharge: 2011-08-06 | Disposition: A | Discharge status: Home / Self Care | Payer: Medicare Other | Source: Ambulatory Visit | Attending: Radiation Oncology | Admitting: Radiation Oncology

## 2011-08-07 ENCOUNTER — Ambulatory Visit
Admission: RE | Admit: 2011-08-07 | Discharge: 2011-08-07 | Disposition: A | Discharge status: Home / Self Care | Payer: Medicare Other | Source: Ambulatory Visit | Attending: Radiation Oncology | Admitting: Radiation Oncology

## 2011-08-07 VITALS — Wt 183.5 lb

## 2011-08-07 DIAGNOSIS — C549 Malignant neoplasm of corpus uteri, unspecified: Secondary | ICD-10-CM

## 2011-08-07 NOTE — Progress Notes (Signed)
Pt states she had diarrhea x 2 days last week; Imodium w/releif. Occass takes GasX for "bloating of stomach". "Slight dysuria a few times". Appetite good. No vaginal discharge now, no nausea.

## 2011-08-07 NOTE — Progress Notes (Signed)
DIAGNOSIS:  Recurrent endometrial cancer.  NARRATIVE:  [name] is seen today for weekly assessment.  She has completed 19 out of 28 planned external beam treatments (3420 cGy of a planned 5040 cGy).  The patient is doing well this week without any problems such as nausea or diarrhea.  Her energy level continues to be good.  The patient denies any vaginal bleeding or rectal bleeding or urination difficulties.  EXAMINATION:  The patient's weight is 182.2 pounds.  Lungs:  Clear. Heart:  Has regular rhythm and rate.  Abdomen:  Soft and nontender with normal bowel sounds.  IMPRESSION AND PLAN:  The patient is tolerating her radiation treatments well at this time.  The patient's radiation fields are setting up accurately.  The patient's radiation chart was checked today.  Plan is to continue to a cumulative dose of 5040 cGy external beam followed by intracavitary brachytherapy treatments.    ______________________________ Billie Lade, Ph.D., M.D. JDK/MEDQ  D:  08/03/2011  T:  08/07/2011  Job:  [id number]

## 2011-08-08 ENCOUNTER — Ambulatory Visit
Admission: RE | Admit: 2011-08-08 | Discharge: 2011-08-08 | Disposition: A | Discharge status: Home / Self Care | Payer: Medicare Other | Source: Ambulatory Visit | Attending: Radiation Oncology | Admitting: Radiation Oncology

## 2011-08-08 ENCOUNTER — Telehealth: Payer: Self-pay | Admitting: *Deleted

## 2011-08-09 ENCOUNTER — Ambulatory Visit
Admission: RE | Admit: 2011-08-09 | Discharge: 2011-08-09 | Disposition: A | Discharge status: Home / Self Care | Payer: Medicare Other | Source: Ambulatory Visit | Attending: Radiation Oncology | Admitting: Radiation Oncology

## 2011-08-10 ENCOUNTER — Ambulatory Visit
Admission: RE | Admit: 2011-08-10 | Discharge: 2011-08-10 | Disposition: A | Discharge status: Home / Self Care | Payer: Medicare Other | Source: Ambulatory Visit | Attending: Radiation Oncology | Admitting: Radiation Oncology

## 2011-08-13 ENCOUNTER — Ambulatory Visit
Admission: RE | Admit: 2011-08-13 | Discharge: 2011-08-13 | Disposition: A | Discharge status: Home / Self Care | Payer: Medicare Other | Source: Ambulatory Visit | Attending: Radiation Oncology | Admitting: Radiation Oncology

## 2011-08-14 ENCOUNTER — Ambulatory Visit
Admission: RE | Admit: 2011-08-14 | Discharge: 2011-08-14 | Disposition: A | Discharge status: Home / Self Care | Payer: Medicare Other | Source: Ambulatory Visit | Attending: Radiation Oncology | Admitting: Radiation Oncology

## 2011-08-14 VITALS — Wt 186.1 lb

## 2011-08-14 DIAGNOSIS — C549 Malignant neoplasm of corpus uteri, unspecified: Secondary | ICD-10-CM

## 2011-08-14 NOTE — Progress Notes (Signed)
DIAGNOSIS:  Recurrent endometrial cancer.  NARRATIVE:  [name] is seen today for weekly assessment.  She has completed 26 of 28 planned external beam radiation treatments (4680 cGy of a planned 5040 cGy).  The patient is tolerating her treatments reasonably well.  She did have some problems with loose bowels and some mild diarrhea over the weekend.  The patient has had some intermittent nausea.  The patient denies any vaginal bleeding or rectal bleeding this time.  PHYSICAL EXAMINATION:  The patient's weight is 186, which is up 3 pounds since her weigh-in last week.  Examination of the lungs reveals them to be clear.  The heart has a regular rhythm and rate.  The abdomen is soft and nontender with normal bowel sounds.  IMPRESSION AND PLAN:  The patient is tolerating her radiation treatments well at this time.  The patient's radiation fields are setting up accurately.  The patient's radiation chart was checked today.  The plan is to continue with 2 additional external beam treatments followed by intracavitary brachytherapy treatments.    ______________________________ Billie Lade, Ph.D., M.D. JDK/MEDQ  D:  08/14/2011  T:  08/14/2011  Job:  [id number]

## 2011-08-14 NOTE — Progress Notes (Signed)
Pt denies any pain, had loose bowels yesterday,nothing taken, sometimes after she eats, she has to go to bathroom, baby wipes help stated pt, stomach hurts vary's day to day, none today .now

## 2011-08-15 ENCOUNTER — Ambulatory Visit
Admission: RE | Admit: 2011-08-15 | Discharge: 2011-08-15 | Disposition: A | Discharge status: Home / Self Care | Payer: Medicare Other | Source: Ambulatory Visit | Attending: Radiation Oncology | Admitting: Radiation Oncology

## 2011-08-16 ENCOUNTER — Ambulatory Visit
Admission: RE | Admit: 2011-08-16 | Discharge: 2011-08-16 | Disposition: A | Discharge status: Home / Self Care | Payer: Medicare Other | Source: Ambulatory Visit | Attending: Radiation Oncology | Admitting: Radiation Oncology

## 2011-08-17 ENCOUNTER — Ambulatory Visit: Payer: Medicare Other

## 2011-08-21 ENCOUNTER — Telehealth: Payer: Self-pay | Admitting: *Deleted

## 2011-08-22 ENCOUNTER — Telehealth: Payer: Self-pay | Admitting: *Deleted

## 2011-08-22 ENCOUNTER — Ambulatory Visit
Admission: RE | Admit: 2011-08-22 | Discharge: 2011-08-22 | Disposition: A | Discharge status: Home / Self Care | Payer: Medicare Other | Source: Ambulatory Visit | Attending: Radiation Oncology | Admitting: Radiation Oncology

## 2011-08-22 ENCOUNTER — Encounter: Payer: Self-pay | Admitting: Radiation Oncology

## 2011-08-22 VITALS — BP 122/77 | HR 75 | Temp 98.3°F | Resp 20 | Wt 182.0 lb

## 2011-08-22 DIAGNOSIS — C549 Malignant neoplasm of corpus uteri, unspecified: Secondary | ICD-10-CM

## 2011-08-22 NOTE — Progress Notes (Signed)
FUNC HERE FOR RECURRENT ENDOMETRIAL CANCER, HDR TODAY , EXPLAINED FOLEY CATHETER PROCEDURE  TO PT, PT GAVE VERBAL UNDERSTANDING, PREPPED PT, CLEANSED AREA WITH BETADINE, 16FR FOLEY CATHETER INSERTED INTO PTS BLADDER, 100CC YELLOW URINE CLEAR OUTPUT, 10CC NS INTO CATHETER TO HOLD IN PLACE,PT TOLERATED, WELL, DIDN'T FEEL IT GOING IN STATED PT,"DIDN'T HURT", PAGED MD

## 2011-08-22 NOTE — Progress Notes (Signed)
Please see the Nurse Progress Note in the MD Initial Consult Encounter for this patient. 

## 2011-08-22 NOTE — Procedures (Signed)
DIAGNOSIS:  Recurrent endometrial cancer.  NARRATIVE:  Earlier today [name] underwent planning and her 1st high-dose rate treatment directed at the proximal vagina. Below is a summary of the events leading up to her planning and treatment.  VAGINAL BRACHYTHERAPY PROCEDURE:  The patient was taken to the nursing suite and placed on the GYN examination table.  Pelvic exam revealed shrinkage of her vaginal cuff mass to at least half if not a third of the pretreatment value.  There appeared to be minimal necrotic tissue in the proximal vagina.  Bimanual examination revealed no significant mass effect at this time.  The patient had a Foley catheter placed under sterile conditions.  The patient's Foley catheter will be use for imaging purposes for bladder contrast.  The patient then underwent fitting for her vaginal cylinder.  The optimal ring and diameter arrangement was three 3.0 cm diameter rings was placed proximally within the vaginal vault.  More distally were two 2.5 cm rings.  The patient tolerated this procedure well.  SIMPLE TREATMENT DEVICE:  The patient, as above, had construction of a custom-shaped vaginal cylinder used for high-dose rate treatments.  COMPLEX SIMULATION:  The patient was then transferred to the CT simulator suite.  Her vaginal cylinder was reinserted.  In addition, contrast was placed in the bladder and rectum.  The patient then proceeded to undergo CT scan in the treatment position.  Under virtual simulation, good images of the vaginal cylinder as well as rectum and bladder and fiducial markers were noted.  The patient subsequently had planning for her high-dose rate treatment.  The patient will receive 4 weekly sessions with a dose to the proximal vagina of 600 cGy.  iridium 192 will be the high-dose rate source.  VERIFICATION STIMULATION:  After completion of the patient's planning as above, she was then transferred to the high-dose rate suite.   Her vaginal cylinder was reinserted.  An AP and lateral film was obtained in the treatment position.  This was compared to the patient's planning films earlier in the day documenting good position of the vaginal cylinder for treatment.  HIGH-DOSE RATE BRACHYTHERAPY PROCEDURE:  The patient then had attachment of her remote after-loading catheter to the vaginal cylinder.  The patient proceeded to undergo her 1st high-dose rate treatment.  The patient was prescribed a dose of 600 cGy to the mucosal surface.  This was achieved with a total dwell time of 362 seconds.  The patient was treated with 1 channel using 7 dwell positions.  The patient tolerated her procedure well.  After completion of her therapy, a radiation survey was performed documenting return of the iridium source into the Nucletron safe.    ______________________________ [name], Ph.D., M.D. JDK/MEDQ  D:  08/22/2011  T:  08/22/2011  Job:  [id number]

## 2011-08-22 NOTE — Progress Notes (Signed)
Pt cylinder for hdr numbers are [id number]

## 2011-08-22 NOTE — Progress Notes (Signed)
Foley cath removed at 0920 withoutt any problems, patient tolerated well.

## 2011-08-31 ENCOUNTER — Telehealth: Payer: Self-pay | Admitting: *Deleted

## 2011-09-03 ENCOUNTER — Ambulatory Visit
Admission: RE | Admit: 2011-09-03 | Discharge: 2011-09-03 | Disposition: A | Discharge status: Home / Self Care | Payer: Medicare Other | Source: Ambulatory Visit | Attending: Radiation Oncology | Admitting: Radiation Oncology

## 2011-09-03 DIAGNOSIS — C549 Malignant neoplasm of corpus uteri, unspecified: Secondary | ICD-10-CM

## 2011-09-03 NOTE — Procedures (Signed)
DIAGNOSIS:  Recurrent endometrial cancer.  NARRATIVE:  Earlier today Mrs. Husak underwent her 2nd high-dose rate treatment directed at the proximal vagina.  Below is a summary of her setup and treatment.  VAGINAL BRACHYTHERAPY:  Mrs. Swango was placed on the high-dose rate treatment table.  The patient proceeded to undergo serial dilatation of the vaginal introitus.  The patient's exam showed further shrinkage of her mass within the proximal vagina.  The patient's previously constructed vaginal cylinder was then placed in the proximal vagina.  The patient tolerated the procedure well.  VERIFICATION STIMULATION:  The patient then had a fiducial marker placed within the vaginal cylinder.  An AP and lateral film was obtained in the treatment position documenting good position of the vaginal cylinder for treatment.  HIGH-DOSE RATE BRACHYTHERAPY:  The patient then had attachment of her cylinder by a catheter system to the remote afterloader.  The patient then proceeded to undergo her 2nd high-dose rate treatment.  The patient was prescribed a dose of 600 cGy to be delivered to the vaginal mucosal surface.  This was achieved with a total dwell time of 179.6 seconds. The patient was treated with 7 dwell positions using 1 channel.  Iridium 192 was the high-dose rate source.  The patient tolerated the procedure well.  After completion of her therapy, a radiation survey was performed documenting return of the iridium source into the Nucletron safe.    ______________________________ Billie Lade, Ph.D., M.D. JDK/MEDQ  D:  09/03/2011  T:  09/03/2011  Job:  [id number]

## 2011-09-05 ENCOUNTER — Encounter: Payer: Self-pay | Admitting: Radiation Oncology

## 2011-09-10 ENCOUNTER — Telehealth: Payer: Self-pay | Admitting: *Deleted

## 2011-09-11 ENCOUNTER — Encounter: Payer: Self-pay | Admitting: Radiation Oncology

## 2011-09-11 ENCOUNTER — Ambulatory Visit
Admission: RE | Admit: 2011-09-11 | Discharge: 2011-09-11 | Disposition: A | Discharge status: Home / Self Care | Payer: Medicare Other | Source: Ambulatory Visit | Attending: Radiation Oncology | Admitting: Radiation Oncology

## 2011-09-11 DIAGNOSIS — C549 Malignant neoplasm of corpus uteri, unspecified: Secondary | ICD-10-CM

## 2011-09-11 NOTE — Procedures (Signed)
DIAGNOSIS:  Recurrent endometrial cancer.  Below is the summary of the patient's setup and treatment for 3 of 4 planned high-dose rate treatments.  VAGINAL BRACHYTHERAPY PROCEDURE:  The patient was placed in the dorsal lithotomy position on the high-dose rate treatment table.  The patient then proceeded to undergo pelvic examination.  The patient's mass in the proximal vagina was barely palpable at this time.  The patient then proceeded to undergo serial dilatation of the vaginal introitus.  The patient's previously constructed vaginal cylinder was then placed in the proximal vagina.  The vaginal cylinder was then affixed to the CT/MR stabilization plate to prevent slippage.  The patient tolerated this procedure well.  VERIFICATION SIMULATION:  The patient then had a fiducial marker placed within the vaginal cylinder.  An AP and lateral film was obtained in the treatment position.  This was compared to the patient's planning films, documenting good position of the vaginal cylinder for treatment.  HIGH-DOSE RATE BRACHYTHERAPY PROCEDURE:  The patient then had attachment of the vaginal cylinder to the remote afterloading catheter.  The patient proceeded to undergo her 3rd high-dose rate treatment.  The patient was prescribed a dose of 600 cGy to be delivered to the mucosal surface.  This was achieved with a total dwell time of 193.8 seconds. This was treated with 1 channel using 7 dwell positions.  A 3-cm diameter cylinder was used to deliver the patient's treatment.  The patient tolerated the treatment well.  After completion of her therapy, a radiation survey was performed, documenting return of the iridium source into the Nucletron safe.    ______________________________ Billie Lade, Ph.D., M.D. JDK/MEDQ  D:  09/11/2011  T:  09/11/2011  Job:  [id number]

## 2011-09-12 ENCOUNTER — Encounter: Payer: Self-pay | Admitting: Radiation Oncology

## 2011-09-14 IMAGING — CR DG CHEST 2V
2 series · 2 of 2 positions shown · non-contrast
Comparison: 10/10/2009 and CT of 09/25/2010

CLINICAL DATA: High blood pressure.  Chest pain.

CHEST - 2 VIEW

[w chest pa]
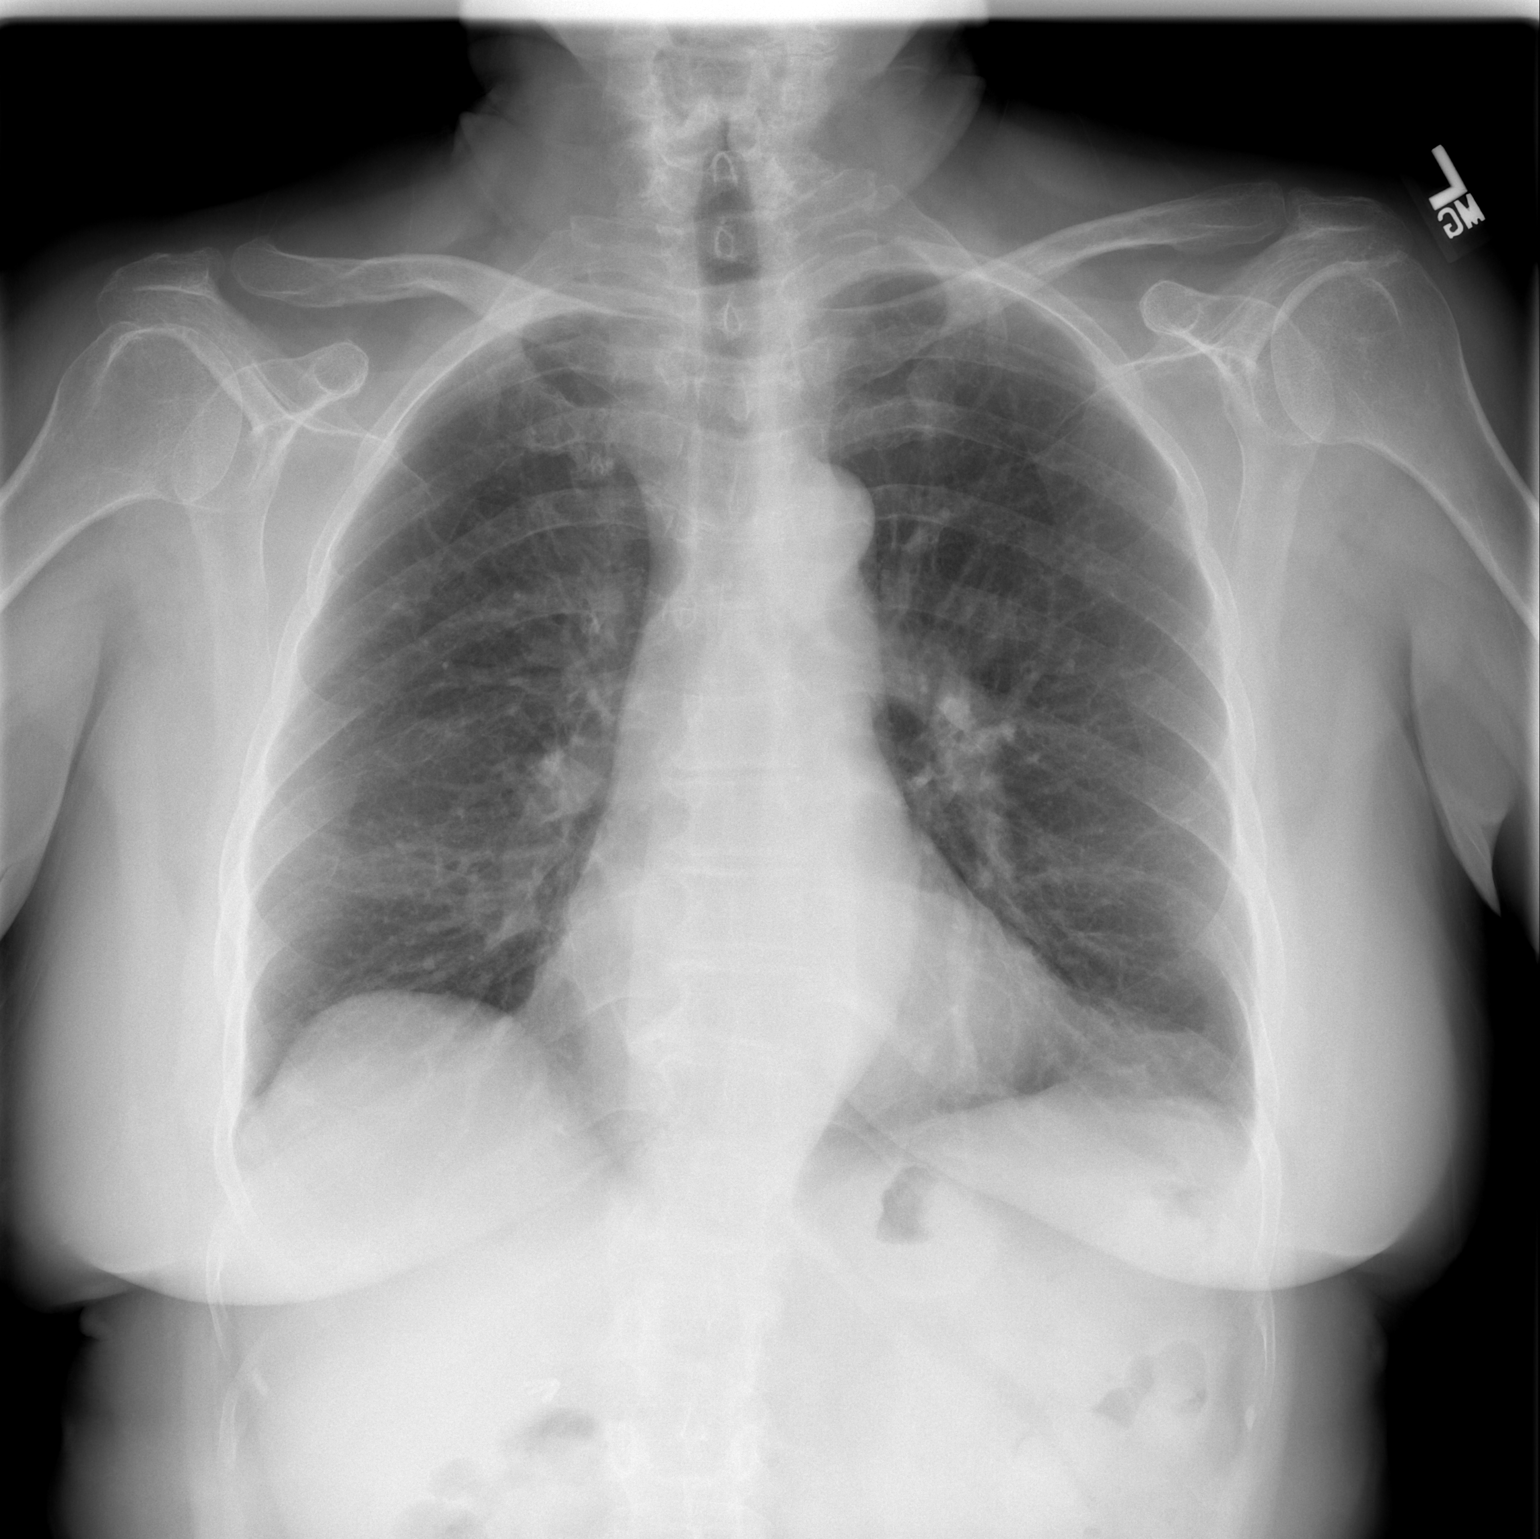

[w chest lat]
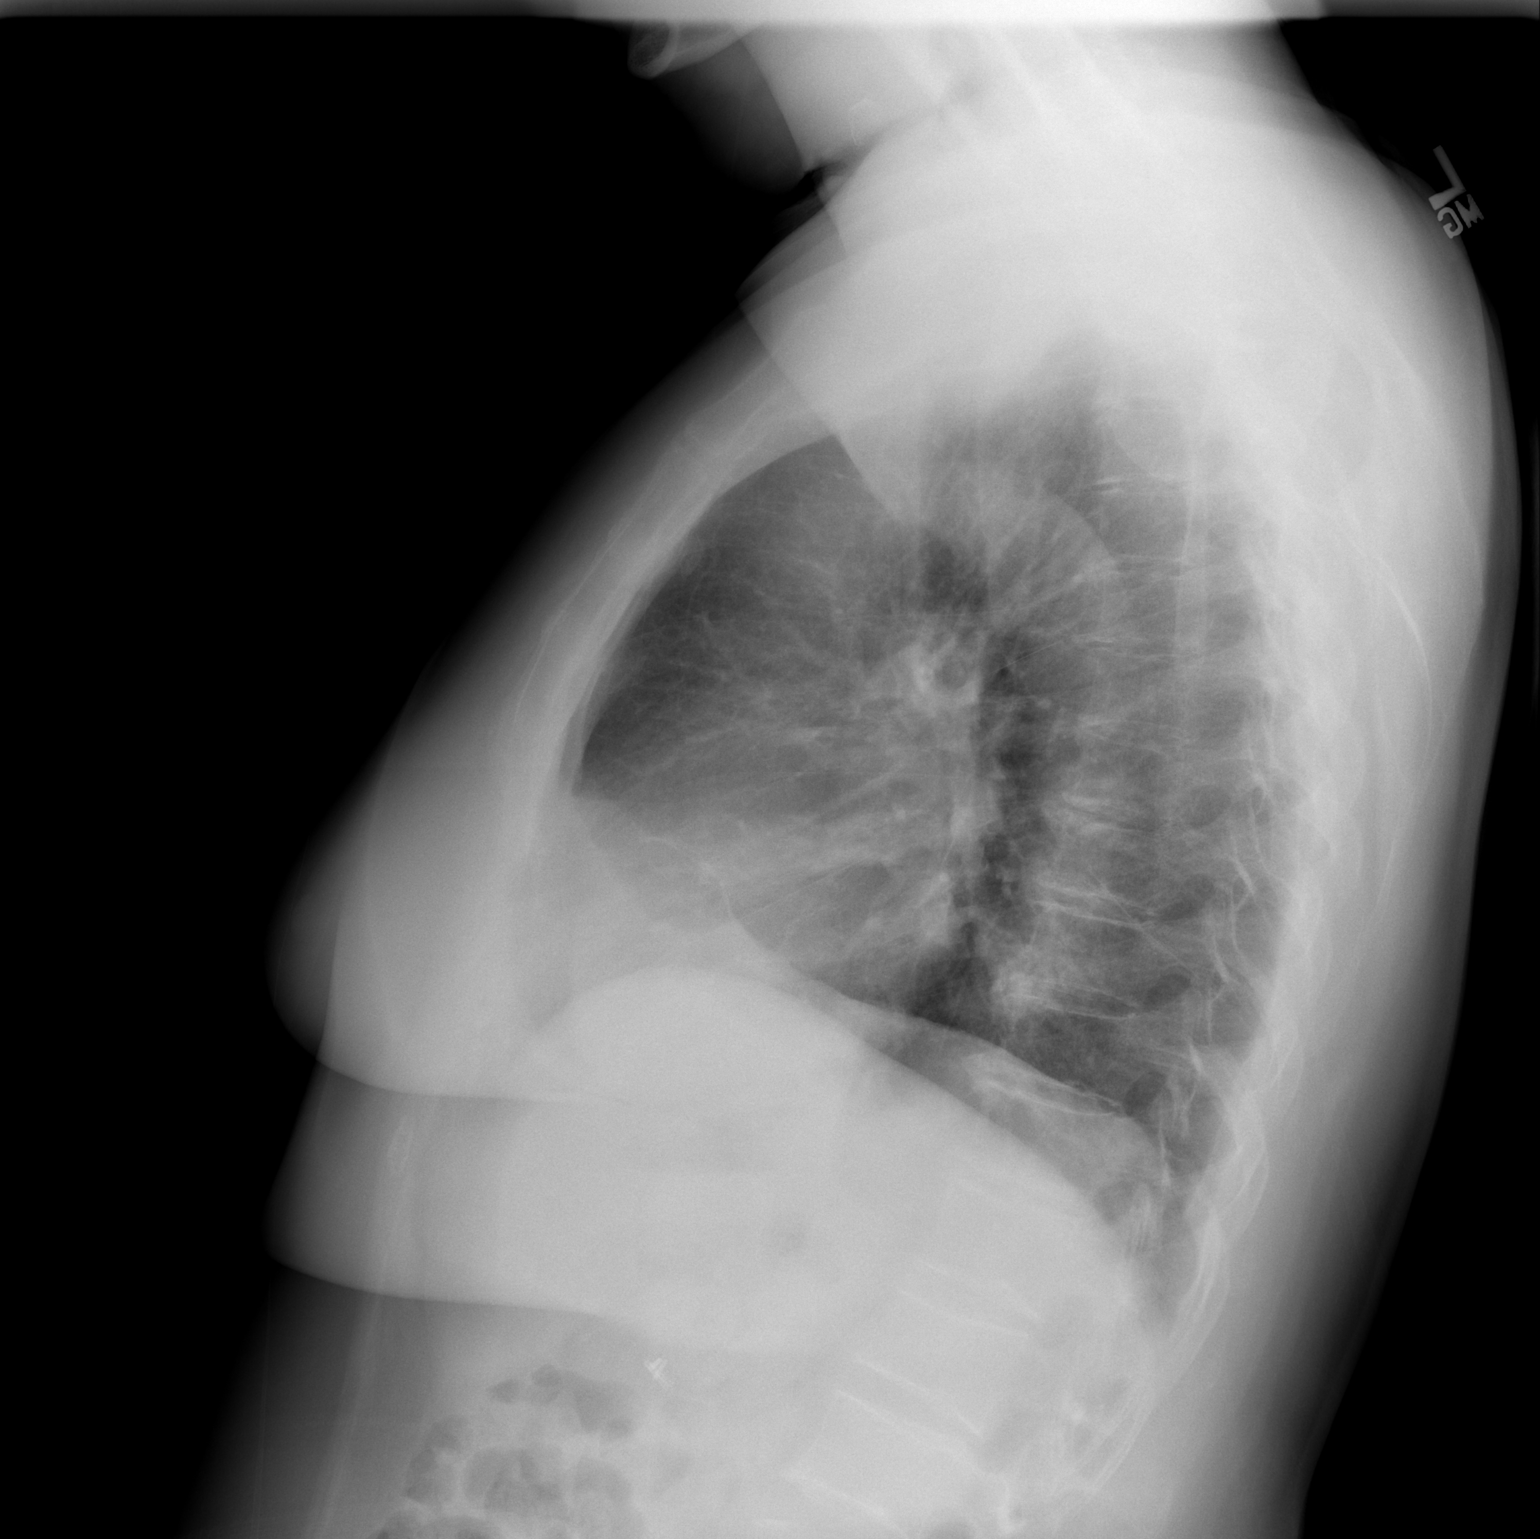

[2 of 2 positions shown; findings below may reference images not displayed]

FINDINGS: Midline trachea.  Normal heart size.  Tortuous descending
thoracic aorta. No pleural effusion or pneumothorax.  Mild volume
loss/scar at the left lung base.  Lungs otherwise clear.
IMPRESSION: No acute cardiopulmonary disease.

## 2011-09-17 ENCOUNTER — Telehealth: Payer: Self-pay | Admitting: *Deleted

## 2011-09-18 ENCOUNTER — Encounter: Payer: Self-pay | Admitting: Radiation Oncology

## 2011-09-18 ENCOUNTER — Ambulatory Visit
Admission: RE | Admit: 2011-09-18 | Discharge: 2011-09-18 | Disposition: A | Discharge status: Home / Self Care | Payer: Medicare Other | Source: Ambulatory Visit | Attending: Radiation Oncology | Admitting: Radiation Oncology

## 2011-09-18 DIAGNOSIS — C549 Malignant neoplasm of corpus uteri, unspecified: Secondary | ICD-10-CM

## 2011-09-18 NOTE — Procedures (Signed)
DIAGNOSIS:  Recurrent endometrial cancer.  NARRATIVE:  Below is the summary of the patient's setup and treatment.  VAGINAL BRACHYTHERAPY PROCEDURE:  The patient was placed on the high- dose rate treatment table.  Pelvic exam was performed, showing continued shrinkage of the patient's proximal vaginal mass.  The patient then proceeded to undergo serial dilatation of the vaginal introitus.  The patient's previously constructed vaginal cylinder was then placed in the proximal vagina.  The cylinder was then affixed to the CT/MR stabilization plate to prevent slippage.  The patient tolerated this procedure well.  VERIFICATION STIMULATION:  The patient then had a fiducial marker placed within the vaginal cylinder.  An AP and lateral film was obtained in the treatment position showing good position of the vaginal cylinder for treatment.  HIGH-DOSE RATE BRACHYTHERAPY PROCEDURE:  The patient's vaginal cylinder was then affixed to the remote afterloading device through a catheter system.  The patient then proceeded to undergo her 4th and final high- dose rate treatment.  The patient was prescribed a dose of 600 cGy to be delivered to the mucosal surface.  This was achieved with a total dwell time of 206.9 seconds.  The patient was treated with 1 channel using 7 dwell positions.  The patient tolerated the procedure well.  After completion of her therapy, a radiation survey was performed documenting return of the radium source into the Nucletron safe.  This completes the patient's planned course of treatment.  She will be set up for followup in 1 month.    ______________________________ Billie Lade, Ph.D., M.D. JDK/MEDQ  D:  09/18/2011  T:  09/18/2011  Job:  [id number]

## 2011-09-23 NOTE — Progress Notes (Signed)
**Note Julia-Identified via Obfuscation** CC:   [name], M.D. [name]. [name], M.D. [name]. [name], M.D.  DIAGNOSIS:  Recurrent endometrial cancer.  INDICATION FOR THERAPY:  Definitive treatment.  TREATMENT DATES: 1. External beam radiation therapy July 09, 2011 through August 16, 2011. 2. High-dose rate brachytherapy on January 19th, December 31st,     January 8th and January 15th.  SITE/DOSE:  Pelvis area:  5040 cGy in 28 fractions (180 cGy per fraction).  The proximal vagina was boosted to a cumulative dose of 7440 cGy with intracavitary brachytherapy treatments.  ENERGY/FIELDS:  The patient was initially treated with IMRT using 6 MV photons.  The patient was treated with helical IMRT on the tomotherapy unit.  The patient did undergo daily megavoltage CT scans for accurate setup and treatment.  After 28 treatments, the patient underwent therapy directed at the site of recurrence in the proximal vagina.  The patient was treated with iridium 192 as the high-dose rate source.  She was treated to a surface dose of 600 cGy, treatment length of 3 cm.  A 3 cm diameter cylinder was used to deliver the patient's treatment.  The patient received 4 additional treatments at 600 cGy for an additional dose of 2400 cGy intracavitary brachytherapy treatments.  Cumulative dose to the area of recurrence was 7440 cGy.  NARRATIVE:  [name] overall tolerated her treatments reasonably well.  She did have some problems with mild nausea, loose bowels, and diarrhea as she progressed through the treatments.  The patient also had some fatigue.  On exam, towards the end of her therapy with intracavitary brachytherapy treatments, the patient's tumor mass had responded well to her treatments.  FOLLOWUP APPOINTMENT:  In 1 month.    ______________________________ [name], Ph.D., M.D. JDK/MEDQ  D:  09/23/2011  T:  09/23/2011  Job:  [id number]

## 2011-09-23 NOTE — Progress Notes (Signed)
DIAGNOSIS:  Recurrent endometrial cancer.  NARRATIVE:  On July 06, 1999, Mrs. Neer had completion of her IMRT plan directed at the pelvis area.  IMRT was chosen over conventional or conformal radiation therapy to more accurately target the area of concern and to limit dose to normal surrounding critical structures, i.e., the bladder, rectum, and femoral head and neck area.  Dose volume histograms of the target area as well as critical structures were reviewed, accepted and placed in the patient's chart.  The plan is for the patient to receive 28 treatments at 180 cGy per day for a cumulative dose of 5040 cGy.  6 MV photons will be used to deliver the patient's treatment.    ______________________________ Billie Lade, Ph.D., M.D. JDK/MEDQ  D:  09/23/2011  T:  09/23/2011  Job:  [id number]

## 2011-09-25 ENCOUNTER — Encounter: Payer: Self-pay | Admitting: Radiation Oncology

## 2011-10-02 ENCOUNTER — Encounter: Payer: Self-pay | Admitting: Radiation Oncology

## 2011-10-05 DIAGNOSIS — IMO0001 Reserved for inherently not codable concepts without codable children: Secondary | ICD-10-CM

## 2011-10-05 HISTORY — DX: Reserved for inherently not codable concepts without codable children: IMO0001

## 2011-10-15 ENCOUNTER — Ambulatory Visit
Admission: RE | Admit: 2011-10-15 | Discharge: 2011-10-15 | Disposition: A | Discharge status: Home / Self Care | Payer: Medicare Other | Source: Ambulatory Visit | Attending: Radiation Oncology | Admitting: Radiation Oncology

## 2011-10-15 VITALS — BP 141/85 | HR 53 | Temp 97.9°F | Wt 184.6 lb

## 2011-10-15 DIAGNOSIS — C549 Malignant neoplasm of corpus uteri, unspecified: Secondary | ICD-10-CM

## 2011-10-15 NOTE — Progress Notes (Signed)
Patient given size medium dilator and instruction on use to prevent constriction of vaginal wall post radiation of pelvis. Understands to perform at least 3 times per week.

## 2011-10-15 NOTE — Progress Notes (Signed)
CC:   De Blanch, M.D. Malachi Pro. Ambrose Mantle, M.D. Veverly Fells. Altheimer, M.D.  DIAGNOSIS:  Recurrent endometrial cancer.  INTERVAL SINCE RADIATION THERAPY:  One month.  NARRATIVE:  Ms. [name] comes in today for routine followup.  She completed external beam and intracavitary brachytherapy treatments as part of her management.  Clinically the patient seems to be doing quite well at this time.  She denies any persistent fatigue.  She denies any urination or bowel complaints.  The patient denies any vaginal bleeding.  PHYSICAL EXAMINATION:  Vital Signs:  The patient's temperature is 97.9, pulse is 53, blood pressure is 141/85, weight is 184.6 pounds.  Lymph: Examination of the neck and supraclavicular region reveals no evidence of adenopathy.  The axillary areas are free of adenopathy.  Lungs: Clear to auscultation.  Heart:  Has a regular rhythm and rate.  Abdomen: Examination of the abdomen reveals it to be soft and nontender with normal bowel sounds.  There is no obvious hepatosplenomegaly.  Pelvic: A pelvic exam and Pap smear is not performed in light of the patient's recent completion of treatment.  IMPRESSION AND PLAN:  Clinically stable status post external beam and intracavitary brachytherapy treatments.  Today the patient was given a vaginal dilator and instructions on its use in light of her pelvic radiation treatments.  I have advised the patient to follow up with Dr. Stanford Breed in 2 months for her 1st pelvic exam and Pap smear.  The patient will return for followup in Radiation Oncology in 5 months.    ______________________________ [name], Ph.D., M.D. JDK/MEDQ  D:  10/15/2011  T:  10/15/2011  Job:  [id number]

## 2011-10-15 NOTE — Progress Notes (Signed)
Here for routine follow up post external beam radiation and HDR to pelvis completed September 18, 2011. Denies pain or any side effects related to radiation treatment.

## 2011-10-24 ENCOUNTER — Ambulatory Visit: Payer: Medicare Other | Admitting: Radiation Oncology

## 2011-10-26 NOTE — Telephone Encounter (Signed)
XXX

## 2011-10-26 NOTE — Telephone Encounter (Signed)
xxx

## 2011-10-26 NOTE — Telephone Encounter (Signed)
XXXX 

## 2011-12-06 ENCOUNTER — Encounter: Payer: Self-pay | Admitting: Gynecologic Oncology

## 2011-12-07 ENCOUNTER — Other Ambulatory Visit (HOSPITAL_COMMUNITY)
Admission: RE | Admit: 2011-12-07 | Discharge: 2011-12-07 | Disposition: A | Discharge status: Home / Self Care | Payer: Medicare Other | Source: Ambulatory Visit | Attending: Gynecology | Admitting: Gynecology

## 2011-12-07 ENCOUNTER — Encounter: Payer: Self-pay | Admitting: Gynecology

## 2011-12-07 ENCOUNTER — Ambulatory Visit: Payer: Medicare Other | Attending: Gynecology | Admitting: Gynecology

## 2011-12-07 VITALS — BP 118/68 | HR 62 | Temp 97.8°F | Resp 16 | Ht 64.0 in | Wt 181.1 lb

## 2011-12-07 DIAGNOSIS — Z90710 Acquired absence of both cervix and uterus: Secondary | ICD-10-CM | POA: Insufficient documentation

## 2011-12-07 DIAGNOSIS — K219 Gastro-esophageal reflux disease without esophagitis: Secondary | ICD-10-CM | POA: Insufficient documentation

## 2011-12-07 DIAGNOSIS — Z79899 Other long term (current) drug therapy: Secondary | ICD-10-CM | POA: Insufficient documentation

## 2011-12-07 DIAGNOSIS — E785 Hyperlipidemia, unspecified: Secondary | ICD-10-CM | POA: Insufficient documentation

## 2011-12-07 DIAGNOSIS — Z9079 Acquired absence of other genital organ(s): Secondary | ICD-10-CM | POA: Insufficient documentation

## 2011-12-07 DIAGNOSIS — C541 Malignant neoplasm of endometrium: Secondary | ICD-10-CM

## 2011-12-07 DIAGNOSIS — K5730 Diverticulosis of large intestine without perforation or abscess without bleeding: Secondary | ICD-10-CM | POA: Insufficient documentation

## 2011-12-07 DIAGNOSIS — C549 Malignant neoplasm of corpus uteri, unspecified: Secondary | ICD-10-CM | POA: Insufficient documentation

## 2011-12-07 DIAGNOSIS — Z124 Encounter for screening for malignant neoplasm of cervix: Secondary | ICD-10-CM | POA: Insufficient documentation

## 2011-12-07 DIAGNOSIS — Z923 Personal history of irradiation: Secondary | ICD-10-CM | POA: Insufficient documentation

## 2011-12-07 DIAGNOSIS — R159 Full incontinence of feces: Secondary | ICD-10-CM | POA: Insufficient documentation

## 2011-12-07 NOTE — Patient Instructions (Signed)
We will schedule a PET scan in the near future.  Please return to see Dr. Roselind Messier July and Dr. Ambrose Mantle in October.

## 2011-12-07 NOTE — Progress Notes (Signed)
Consult Note: Gyn-Onc   [name] 71 y.o. female  Chief Complaint  Patient presents with  . Endometrial Cancer    Follow up    Interval History: The patient returns today now having completed radiation therapy to the pelvis and vaginal cuff for recurrent endometrial cancer. Radiation was completed in January. Since that time the patient has done well. She denies any pelvic pain vaginal bleeding or discharge. She does note some fecal incontinence which was present before she had radiation therapy. She's using a vaginal dilator as prescribed by Dr. Roselind Messier. She reports she has known diverticulosis. She has no other GI or GU symptoms.    HPI: Patient was initially diagnosed endometrial cancer in June 2009. She underwent a TAH BSO. 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 (Stage IA grade 1).  Patient was followed until a nodule in the upper vagina was noted and confirmed by biopsy in September 2012 to be recurrent disease. As further staging the patient had a PET scan in October which showed hypermetabolic pelvic lymph nodes. The right external iliac node measuring 1.9 cm and a left posterior obturator node measured 0.5 cm. The patient was then treated with whole pelvis radiation therapy and vaginal brachytherapy.  No Known Allergies  Past Medical History  Diagnosis Date  . Endometrial cancer   . Migraines   . Hyperlipidemia   . GERD (gastroesophageal reflux disease)   . Arthritis     Past Surgical History  Procedure Date  . Total abdominal hysterectomy w/ bilateral salpingoophorectomy 2009  . Cholecystectomy     Current Outpatient Prescriptions  Medication Sig Dispense Refill  . ACTONEL 35 MG tablet every 7 (seven) days.       Marland Kitchen aluminum hydroxide-magnesium carbonate (GAVISCON) 31.7-137 MG/5ML suspension Take 15 mLs by mouth every 6 (six) hours as needed.        Marland Kitchen aluminum-magnesium hydroxide-simethicone (MAALOX)  200-200-20 MG/5ML SUSP Take 15 mLs by mouth as needed.        . Calcium Carbonate (CALTRATE 600 PO) Take 1 tablet by mouth 3 (three) times daily.       . calcium carbonate (TUMS - DOSED IN MG ELEMENTAL CALCIUM) 500 MG chewable tablet Chew 1 tablet by mouth as needed.        . cholecalciferol (VITAMIN D) 1000 UNITS tablet Take 1,000 Units by mouth daily.        . multivitamin (THERAGRAN) per tablet Take 1 tablet by mouth daily. 50+ advantage       . oxyCODONE-acetaminophen (PERCOCET) 5-325 MG per tablet Take 1 tablet by mouth every 6 (six) hours as needed.        . propranolol (INNOPRAN XL) 80 MG 24 hr capsule Take 80 mg by mouth at bedtime.       . rosuvastatin (CRESTOR) 20 MG tablet Take 20 mg by mouth daily.        . vitamin C (ASCORBIC ACID) 500 MG tablet Take 500 mg by mouth daily.        Marland Kitchen zinc gluconate 50 MG tablet Take 50 mg by mouth daily.          History   Social History  . Marital Status: Single    Spouse Name: N/A    Number of Children: N/A  . Years of Education: N/A   Occupational History  . Not on file.   Social History Main Topics  . Smoking status: Never Smoker   . Smokeless tobacco: Never Used  .  Alcohol Use: No  . Drug Use: Not on file  . Sexually Active: Not on file   Other Topics Concern  . Not on file   Social History Narrative  . No narrative on file    Family History  Problem Relation Age of Onset  . Heart failure Mother   . Ulcers Father     Review of Systems: 10 point review of systems negative except as noted above  Vitals: Blood pressure 118/68, pulse 62, temperature 97.8 F (36.6 C), temperature source Oral, resp. rate 16, height 5\' 4"  (1.626 m), weight 181 lb 1.6 oz (82.146 kg).  Physical Exam: In general patient is a healthy white female no acute distress.  HEENT is negative  Neck supple without thyromegaly  The abdomen is soft nontender no masses organomegaly ascites or hernias are noted.  There is no supraclavicular or inguinal  adenopathy  Pelvic exam  EGBUS vagina bladder urethra are normal.  Cervix and uterus are surgically absent. The vaginal cuff was inspected carefully and there is no evidence of residual disease.  Bimanual and rectovaginal exam revealed no masses induration or nodularity    Assessment/Plan: Recurrent endometrial cancer initially diagnosed in 2009 with recurrence in 2012. A she's clinically free of disease.  The Pap smears or obtained  We will schedule a PET scan for reassessment of the status of her pelvic lymph nodes.  If this is negative we will have her see Dr.Kinard  in July, Dr. Ambrose Mantle and October, and return to see me in January of 2014.   Jeannette Corpus, MD 12/07/2011, 9:52 AM                         Consult Note: Gyn-Onc   [name] 71 y.o. female  Chief Complaint  Patient presents with  . Endometrial Cancer    Follow up    Interval History:   HPI:  No Known Allergies  Past Medical History  Diagnosis Date  . Endometrial cancer   . Migraines   . Hyperlipidemia   . GERD (gastroesophageal reflux disease)   . Arthritis     Past Surgical History  Procedure Date  . Total abdominal hysterectomy w/ bilateral salpingoophorectomy 2009  . Cholecystectomy     Current Outpatient Prescriptions  Medication Sig Dispense Refill  . ACTONEL 35 MG tablet every 7 (seven) days.       Marland Kitchen aluminum hydroxide-magnesium carbonate (GAVISCON) 31.7-137 MG/5ML suspension Take 15 mLs by mouth every 6 (six) hours as needed.        Marland Kitchen aluminum-magnesium hydroxide-simethicone (MAALOX) 200-200-20 MG/5ML SUSP Take 15 mLs by mouth as needed.        . Calcium Carbonate (CALTRATE 600 PO) Take 1 tablet by mouth 3 (three) times daily.       . calcium carbonate (TUMS - DOSED IN MG ELEMENTAL CALCIUM) 500 MG chewable tablet Chew 1 tablet by mouth as needed.        . cholecalciferol (VITAMIN D) 1000 UNITS tablet Take 1,000 Units by mouth daily.        .  multivitamin (THERAGRAN) per tablet Take 1 tablet by mouth daily. 50+ advantage       . oxyCODONE-acetaminophen (PERCOCET) 5-325 MG per tablet Take 1 tablet by mouth every 6 (six) hours as needed.        . propranolol (INNOPRAN XL) 80 MG 24 hr capsule Take 80 mg by mouth at bedtime.       Marland Kitchen  rosuvastatin (CRESTOR) 20 MG tablet Take 20 mg by mouth daily.        . vitamin C (ASCORBIC ACID) 500 MG tablet Take 500 mg by mouth daily.        Marland Kitchen zinc gluconate 50 MG tablet Take 50 mg by mouth daily.          History   Social History  . Marital Status: Single    Spouse Name: N/A    Number of Children: N/A  . Years of Education: N/A   Occupational History  . Not on file.   Social History Main Topics  . Smoking status: Never Smoker   . Smokeless tobacco: Never Used  . Alcohol Use: No  . Drug Use: Not on file  . Sexually Active: Not on file   Other Topics Concern  . Not on file   Social History Narrative  . No narrative on file    Family History  Problem Relation Age of Onset  . Heart failure Mother   . Ulcers Father     Review of Systems:  Vitals: Blood pressure 118/68, pulse 62, temperature 97.8 F (36.6 C), temperature source Oral, resp. rate 16, height 5\' 4"  (1.626 m), weight 181 lb 1.6 oz (82.146 kg).  Physical Exam:  Assessment/Plan:   Jeannette Corpus, MD 12/07/2011, 9:52 AM

## 2011-12-17 ENCOUNTER — Encounter (HOSPITAL_COMMUNITY)
Admission: RE | Admit: 2011-12-17 | Discharge: 2011-12-17 | Disposition: A | Discharge status: Home / Self Care | Payer: Medicare Other | Source: Ambulatory Visit | Attending: Gynecology | Admitting: Gynecology

## 2011-12-17 DIAGNOSIS — C541 Malignant neoplasm of endometrium: Secondary | ICD-10-CM

## 2011-12-17 DIAGNOSIS — R222 Localized swelling, mass and lump, trunk: Secondary | ICD-10-CM | POA: Insufficient documentation

## 2011-12-17 DIAGNOSIS — C549 Malignant neoplasm of corpus uteri, unspecified: Secondary | ICD-10-CM | POA: Insufficient documentation

## 2011-12-17 DIAGNOSIS — Z006 Encounter for examination for normal comparison and control in clinical research program: Secondary | ICD-10-CM | POA: Insufficient documentation

## 2011-12-17 MED ORDER — FLUDEOXYGLUCOSE F - 18 (FDG) INJECTION
17.9000 | Freq: Once | INTRAVENOUS | Status: AC | PRN
  Administered 2011-12-17: 17.9 via INTRAVENOUS

## 2011-12-18 LAB — GLUCOSE, CAPILLARY: Glucose-Capillary: 111 mg/dL — ABNORMAL HIGH (ref 70–99)

## 2011-12-26 ENCOUNTER — Ambulatory Visit
Admission: RE | Admit: 2011-12-26 | Discharge: 2011-12-26 | Disposition: A | Discharge status: Home / Self Care | Payer: Medicare Other | Source: Ambulatory Visit | Attending: Radiation Oncology | Admitting: Radiation Oncology

## 2011-12-26 ENCOUNTER — Encounter: Payer: Self-pay | Admitting: Radiation Oncology

## 2011-12-26 VITALS — BP 125/78 | HR 54 | Temp 98.0°F | Wt 176.0 lb

## 2011-12-26 DIAGNOSIS — C549 Malignant neoplasm of corpus uteri, unspecified: Secondary | ICD-10-CM

## 2011-12-26 DIAGNOSIS — Z510 Encounter for antineoplastic radiation therapy: Secondary | ICD-10-CM | POA: Insufficient documentation

## 2011-12-26 DIAGNOSIS — Z79899 Other long term (current) drug therapy: Secondary | ICD-10-CM | POA: Insufficient documentation

## 2011-12-26 HISTORY — DX: Reserved for concepts with insufficient information to code with codable children: IMO0002

## 2011-12-26 NOTE — Progress Notes (Signed)
Radiation Oncology         [phone] ________________________________  Name: [name] MRN: [id number]  Date: 12/26/2011  DOB: 03/28/1941  Follow-Up Visit Note  CC: Junious Silk, MD, MD  [place] * Diagnosis:   Recurrent Endometrial Cancer  Interval Since Last Radiation:  3 Months  Narrative:  The patient returns today for follow-up.  Clinically the patient is doing well at this time. She denies any vaginal bleeding urination difficulties or bowel complaints. She does have some mild fecal incontinence which she had prior to her radiation therapy. Patient wears a pad for this issue. Earlier this month she did see Dr. Darlyn Read. A pelvic exam showed no evidence of residual malignancy. The patient was set up for a PET scan which showed no residual activity in the pelvis however there was noted to have a uptake in the periaortic area at the level of the kidneys. This was in  a subcentimeter lymph node with a  SUV of 4.1   . The patient was also noted to have some activity in the neck area on a left posterior cervical nodal area, this was felt to be reactive in nature.     Given the isolated area within the periaortic area, radiation therapy is been consulted for consideration for additional treatment.                      ALLERGIES:   has no known allergies.  Meds: Current Outpatient Prescriptions  Medication Sig Dispense Refill  . ACTONEL 35 MG tablet every 7 (seven) days.       Marland Kitchen aluminum hydroxide-magnesium carbonate (GAVISCON) 31.7-137 MG/5ML suspension Take 15 mLs by mouth every 6 (six) hours as needed.        Marland Kitchen aluminum-magnesium hydroxide-simethicone (MAALOX) 200-200-20 MG/5ML SUSP Take 15 mLs by mouth as needed.        . Calcium Carbonate (CALTRATE 600 PO) Take 1 tablet by mouth 3 (three) times daily.       . calcium carbonate (TUMS - DOSED IN MG ELEMENTAL CALCIUM) 500 MG chewable tablet Chew 1 tablet by mouth as needed.        . cholecalciferol  (VITAMIN D) 1000 UNITS tablet Take 1,000 Units by mouth daily.        . multivitamin (THERAGRAN) per tablet Take 1 tablet by mouth daily. 50+ advantage       . oxyCODONE-acetaminophen (PERCOCET) 5-325 MG per tablet Take 1 tablet by mouth every 6 (six) hours as needed.        . propranolol (INNOPRAN XL) 80 MG 24 hr capsule Take 80 mg by mouth at bedtime.       . rosuvastatin (CRESTOR) 20 MG tablet Take 20 mg by mouth daily.        . vitamin C (ASCORBIC ACID) 500 MG tablet Take 500 mg by mouth daily.        Marland Kitchen zinc gluconate 50 MG tablet Take 50 mg by mouth daily.          Physical Findings: The patient is in no acute distress. Patient is alert and oriented.  weight is 176 lb (79.833 kg). Her temperature is 98 F (36.7 C). Her blood pressure is 125/78 and her pulse is 54. .  No no palpable adenopathy in the neck, supraclavicular or axillary areas. A pelvic exam is not repeated In light of  her exam earlier this month by Dr. Darlyn Read  Lab Findings: Lab Results  Component  Value Date   WBC 6.8 06/08/2011   HGB 13.6 06/08/2011   HCT 40.7 06/08/2011   MCV 96.0 06/08/2011   PLT 225 06/08/2011    @LASTCHEM @  Radiographic Findings: Nm Pet Nopr Skull Base To Thigh  12/17/2011  *RADIOLOGY REPORT*  Clinical Data: Subsequent treatment strategy for endometrial carcinoma.  NUCLEAR MEDICINE PET SKULL BASE TO THIGH  Fasting Blood Glucose:  111  Technique:  17.9 mCi F-18 FDG was injected intravenously. CT data was obtained and used for attenuation correction and anatomic localization only.  (This was not acquired as a diagnostic CT examination.) Additional exam technical data entered on technologist worksheet.  Comparison:  06/04/2011  Findings:  Neck: There is a new left posterior cervical lymph node which exhibits increased FDG uptake.  This measures 0.7 cm, image 29. The SUV max is equal to the 3.9, image 28.  There is slight asymmetric increased uptake within the right tonsillar region compared with  the left.  Partial opacification of the right maxillary sinus appears similar to previous exam.  Chest:  There is no supraclavicular or axillary hypermetabolic adenopathy.  No abnormal uptake within the mediastinum or hilar regions.  There is no pericardial or pleural effusion.  4 mm left lower lobe nodule is identified, image 87.  This is too small to characterize by PET CT.  Abdomen/Pelvis:  There is no abnormal FDG uptake within the liver, adrenal glands, pancreas, or spleen.  The kidneys both appear within normal limits.  Sub centimeter periaortic lymph node is identified and appears new from previous exam.  There is increased FDG uptake within this lymph node with an SUV max equal to 4.1.  The previously reported hypermetabolic right iliac lymph nodes have resolved in the interval.  The previously described hypermetabolic left iliac adenopathy have also resolved in the interval.  There is no inguinal adenopathy.  [place]:  No specific features are identified to suggest hypermetabolic bone metastases.There is increased uptake within the bilateral sacral wings suggestive of insufficiency fractures.  This appears progressive when compared with previous examination.   IMPRESSION:  1.  Interval resolution of bilateral, hypermetabolic pelvic adenopathy. 2. There has been interval development of sub centimeter periaortic (retroperitoneal)  lymph node which exhibits malignant range FDG uptake.  Cannot rule out metastatic adenopathy to the upper abdomen. 3.  Hypermetabolic lymph node within the left posterior cervical region.  Likely reactive in etiology.  Attention on follow-up imaging is advised.  Original Report Authenticated By: Rosealee Albee, M.D.    Impression:    Recurrent endometrial cancer isolated to the periaortic area. I do feel the patient would be an excellent candidate for radiation therapy directed at this area. I have carefully reviewed the patient's PET scan as well as her previous radiation  fields. This can be safely treated without significant overlap. I will also cover much of the periaortic area in case there is additional microscopic disease in this region. I anticipate a total dose of approximately 4500-5040 centigrade. I discussed the treatment plan and side effects and potential toxicities of radiation therapy in this situation with the patient. Patient does wish to proceed with the radiation therapy is planned.    _____________________________________  Billie Lade, M.D.

## 2011-12-26 NOTE — Progress Notes (Signed)
Encounter addended by: Tessa Lerner, RN on: 12/26/2011  4:11 PM<BR>     Documentation filed: Charges VN

## 2011-12-26 NOTE — Progress Notes (Signed)
Please see the Nurse Progress Note in the MD Initial Consult Encounter for this patient. 

## 2011-12-26 NOTE — Progress Notes (Signed)
Patient here for re-consultation post pet scan on 12/17/2011 which reveals interval development of periaortic(retroperitoneal) lymph node with malignant  Uptake. Patient denies  pain or any other symptoms except acid reflux.

## 2011-12-27 ENCOUNTER — Telehealth: Payer: Self-pay | Admitting: Gynecologic Oncology

## 2011-12-27 NOTE — Telephone Encounter (Signed)
Message left for patient with pap smear results: negative.  Instructed to call for any questions or concerns.  

## 2012-01-07 ENCOUNTER — Ambulatory Visit
Admission: RE | Admit: 2012-01-07 | Discharge: 2012-01-07 | Disposition: A | Discharge status: Home / Self Care | Payer: Medicare Other | Source: Ambulatory Visit | Attending: Radiation Oncology | Admitting: Radiation Oncology

## 2012-01-07 DIAGNOSIS — C772 Secondary and unspecified malignant neoplasm of intra-abdominal lymph nodes: Secondary | ICD-10-CM

## 2012-01-07 DIAGNOSIS — C549 Malignant neoplasm of corpus uteri, unspecified: Secondary | ICD-10-CM

## 2012-01-07 NOTE — Progress Notes (Signed)
  Radiation Oncology         [phone] ________________________________  Name: [name] MRN: [id number]  Date: 01/07/2012  DOB: 08-30-1941  SIMULATION AND TREATMENT PLANNING NOTE  DIAGNOSIS:  Recurrent Endometrial cancer to the Periaortic region  NARRATIVE:  The patient was brought to the CT Simulation planning suite.  Identity was confirmed.  All relevant records and images related to the planned course of therapy were reviewed.  The patient freely provided informed written consent to proceed with treatment after reviewing the details related to the planned course of therapy. The consent form was witnessed and verified by the simulation staff.  Then, the patient was set-up in a stable reproducible  supine position for radiation therapy.  CT images were obtained.  Surface markings were placed.  The CT images were loaded into the planning software.  Then the target and avoidance structures were contoured.  Treatment planning then occurred.  The radiation prescription was entered and confirmed.  A total of 1 complex treatment devices were fabricated (alpha cradle). I have requested : Intensity Modulated Radiotherapy (IMRT) is medically necessary for this case for the following reason:  Small bowel sparing and kidney sparing as well as close proximity to previous fields..  I have ordered:daily [facility] Beam CT.  PLAN:  The patient will receive 56.0 Gy in 28 fractions using VMAT IMRT.  ________________________________   Billie Lade, PhD, MD

## 2012-01-21 ENCOUNTER — Ambulatory Visit: Payer: Medicare Other

## 2012-01-22 ENCOUNTER — Ambulatory Visit: Payer: Medicare Other

## 2012-01-23 ENCOUNTER — Ambulatory Visit: Payer: Medicare Other

## 2012-01-24 ENCOUNTER — Ambulatory Visit: Payer: Medicare Other

## 2012-01-25 ENCOUNTER — Ambulatory Visit: Payer: Medicare Other

## 2012-01-29 ENCOUNTER — Ambulatory Visit
Admission: RE | Admit: 2012-01-29 | Discharge: 2012-01-29 | Disposition: A | Discharge status: Home / Self Care | Payer: Medicare Other | Source: Ambulatory Visit | Attending: Radiation Oncology | Admitting: Radiation Oncology

## 2012-01-29 DIAGNOSIS — C772 Secondary and unspecified malignant neoplasm of intra-abdominal lymph nodes: Secondary | ICD-10-CM

## 2012-01-29 NOTE — Progress Notes (Signed)
Post sim teaching reviewed, patient alert,oriented x3,  No c/o pain at present, gave another radiation therapy and you booklet with s/e, s/s  To report, patient said"Oh, same as last time" 11:02 AM

## 2012-01-29 NOTE — Progress Notes (Signed)
   Department of Radiation Oncology  Phone:  [phone] Fax:        [phone]   Weekly Management Note Current Dose: 2.0  Gy  Projected Dose: 56.0  Gy   Narrative:  The patient presents for routine under treatment assessment.  CBCT  x-rays were reviewed.  The chart was checked.  She is tolerating the radiation therapy well thus far. She denies any nausea or loose bowels.  Physical Findings: The lungs are clear. The heart has regular rhythm and rate. The abdomen is soft and nontender with normal bowel sounds.  Impression:  The patient is tolerating radiation well.  Plan:  Continue treatment as planned.   -----------------------------------  Billie Lade, PhD, MD

## 2012-01-30 ENCOUNTER — Ambulatory Visit
Admission: RE | Admit: 2012-01-30 | Discharge: 2012-01-30 | Disposition: A | Discharge status: Home / Self Care | Payer: Medicare Other | Source: Ambulatory Visit | Attending: Radiation Oncology | Admitting: Radiation Oncology

## 2012-01-31 ENCOUNTER — Ambulatory Visit
Admission: RE | Admit: 2012-01-31 | Discharge: 2012-01-31 | Disposition: A | Discharge status: Home / Self Care | Payer: Medicare Other | Source: Ambulatory Visit | Attending: Radiation Oncology | Admitting: Radiation Oncology

## 2012-02-01 ENCOUNTER — Ambulatory Visit
Admission: RE | Admit: 2012-02-01 | Discharge: 2012-02-01 | Disposition: A | Discharge status: Home / Self Care | Payer: Medicare Other | Source: Ambulatory Visit | Attending: Radiation Oncology | Admitting: Radiation Oncology

## 2012-02-01 NOTE — Progress Notes (Signed)
Please see the Nurse Progress Note in the MD Initial Consult Encounter for this patient. 

## 2012-02-04 ENCOUNTER — Ambulatory Visit
Admission: RE | Admit: 2012-02-04 | Discharge: 2012-02-04 | Disposition: A | Discharge status: Home / Self Care | Payer: Medicare Other | Source: Ambulatory Visit | Attending: Radiation Oncology | Admitting: Radiation Oncology

## 2012-02-05 ENCOUNTER — Encounter: Payer: Self-pay | Admitting: Radiation Oncology

## 2012-02-05 ENCOUNTER — Ambulatory Visit
Admission: RE | Admit: 2012-02-05 | Discharge: 2012-02-05 | Disposition: A | Discharge status: Home / Self Care | Payer: Medicare Other | Source: Ambulatory Visit | Attending: Radiation Oncology | Admitting: Radiation Oncology

## 2012-02-05 VITALS — BP 150/80 | HR 50 | Resp 18 | Wt 178.6 lb

## 2012-02-05 DIAGNOSIS — C772 Secondary and unspecified malignant neoplasm of intra-abdominal lymph nodes: Secondary | ICD-10-CM

## 2012-02-05 NOTE — Progress Notes (Signed)
Patient presents to the clinic today unaccompanied for an under treat visit with Dr. Roselind Messier. Patient is alert and oriented to person, place, and time. No distress noted. Steady gait noted. Pleasant affect noted. Patient denies pain at this time. Patient reports diarrhea. Patient reports having three loose bowel movements in a 24 hour period. Patient denies vaginal discharge. Patient reports voiding without difficulty and clear yellow urine. Patient denies nausea or vomiting. Reported all findings to Dr. Roselind Messier.

## 2012-02-05 NOTE — Progress Notes (Signed)
   Department of Radiation Oncology  Phone:  [phone] Fax:        [phone]   Weekly Management Note Current Dose:  12.0 Gy  Projected Dose: 56.0 Gy   Treatment Site:  Periaortic region  Narrative:  The patient presents for routine under treatment assessment.  CBCT  x-rays were reviewed.  The chart was checked.  She has noticed some some mild diarrhea. Over the weekend she also noticed decrease in her appetite but none during the week.  Physical Findings: Weight: 178 lb 9.6 oz (81.012 kg). The lungs are clear. The heart has regular rhythm and rate. The abdomen is soft and nontender with normal bowel sounds.  Impression:  The patient is tolerating radiation.  Plan:  Continue treatment as planned.   -----------------------------------  Billie Lade, PhD, MD

## 2012-02-06 ENCOUNTER — Ambulatory Visit
Admission: RE | Admit: 2012-02-06 | Discharge: 2012-02-06 | Disposition: A | Discharge status: Home / Self Care | Payer: Medicare Other | Source: Ambulatory Visit | Attending: Radiation Oncology | Admitting: Radiation Oncology

## 2012-02-07 ENCOUNTER — Ambulatory Visit
Admission: RE | Admit: 2012-02-07 | Discharge: 2012-02-07 | Disposition: A | Discharge status: Home / Self Care | Payer: Medicare Other | Source: Ambulatory Visit | Attending: Radiation Oncology | Admitting: Radiation Oncology

## 2012-02-08 ENCOUNTER — Ambulatory Visit
Admission: RE | Admit: 2012-02-08 | Discharge: 2012-02-08 | Disposition: A | Discharge status: Home / Self Care | Payer: Medicare Other | Source: Ambulatory Visit | Attending: Radiation Oncology | Admitting: Radiation Oncology

## 2012-02-11 ENCOUNTER — Ambulatory Visit
Admission: RE | Admit: 2012-02-11 | Discharge: 2012-02-11 | Disposition: A | Discharge status: Home / Self Care | Payer: Medicare Other | Source: Ambulatory Visit | Attending: Radiation Oncology | Admitting: Radiation Oncology

## 2012-02-12 ENCOUNTER — Ambulatory Visit
Admission: RE | Admit: 2012-02-12 | Discharge: 2012-02-12 | Disposition: A | Discharge status: Home / Self Care | Payer: Medicare Other | Source: Ambulatory Visit | Attending: Radiation Oncology | Admitting: Radiation Oncology

## 2012-02-12 DIAGNOSIS — C772 Secondary and unspecified malignant neoplasm of intra-abdominal lymph nodes: Secondary | ICD-10-CM

## 2012-02-12 NOTE — Progress Notes (Signed)
HERE TODAY FOR PUT OF PELVIS.  HAS HAD SOME NAUSEA AND DIARRHEA BUT NOT BAD PER PT.  SAYS THAT SKIN IS OK WOTH NO BURNING OR ITCHING.  STATES HER APPETITE VARIES DUE TO HER REFLUX PROBLEM         T.....98.1,   BP...114/74, P....Marland Kitchen54

## 2012-02-12 NOTE — Progress Notes (Signed)
   Department of Radiation Oncology  Phone:  [phone] Fax:        [phone]   Weekly Management Note Current Dose: 22.0  Gy  Projected Dose: 56.0  Gy   Narrative:  The patient presents for routine under treatment assessment.  CBCT x-rays were reviewed.  The chart was checked.  She is having some nausea at this time but does not wish to have any medication for this issue. She denies any emesis. She does also have some mild diarrhea but has not required any Imodium for this issue.  Physical Findings: This is weight is down couple pounds compared to last week. The abdomen is soft and nontender with normal bowel sounds.  Impression:  The patient is tolerating radiation well except for issues as above.  The radiation fields are setting up accurately.  Plan:  Continue treatment as planned.  _________________________________  Billie Lade, PhD, MD

## 2012-02-13 ENCOUNTER — Ambulatory Visit
Admission: RE | Admit: 2012-02-13 | Discharge: 2012-02-13 | Disposition: A | Discharge status: Home / Self Care | Payer: Medicare Other | Source: Ambulatory Visit | Attending: Radiation Oncology | Admitting: Radiation Oncology

## 2012-02-14 ENCOUNTER — Ambulatory Visit
Admission: RE | Admit: 2012-02-14 | Discharge: 2012-02-14 | Disposition: A | Discharge status: Home / Self Care | Payer: Medicare Other | Source: Ambulatory Visit | Attending: Radiation Oncology | Admitting: Radiation Oncology

## 2012-02-15 ENCOUNTER — Ambulatory Visit
Admission: RE | Admit: 2012-02-15 | Discharge: 2012-02-15 | Disposition: A | Discharge status: Home / Self Care | Payer: Medicare Other | Source: Ambulatory Visit | Attending: Radiation Oncology | Admitting: Radiation Oncology

## 2012-02-18 ENCOUNTER — Ambulatory Visit
Admission: RE | Admit: 2012-02-18 | Discharge: 2012-02-18 | Disposition: A | Discharge status: Home / Self Care | Payer: Medicare Other | Source: Ambulatory Visit | Attending: Radiation Oncology | Admitting: Radiation Oncology

## 2012-02-19 ENCOUNTER — Ambulatory Visit
Admission: RE | Admit: 2012-02-19 | Discharge: 2012-02-19 | Disposition: A | Discharge status: Home / Self Care | Payer: Medicare Other | Source: Ambulatory Visit | Attending: Radiation Oncology | Admitting: Radiation Oncology

## 2012-02-19 VITALS — BP 115/71 | HR 52 | Wt 177.3 lb

## 2012-02-19 DIAGNOSIS — C772 Secondary and unspecified malignant neoplasm of intra-abdominal lymph nodes: Secondary | ICD-10-CM

## 2012-02-19 NOTE — Progress Notes (Signed)
Patient here for weekly under treat visit for radiation of periaortic region. Denies pain, nausea or fatigue.

## 2012-02-19 NOTE — Progress Notes (Signed)
   Department of Radiation Oncology  Phone:  [phone] Fax:        [phone]   Weekly Management Note Current Dose: 32.0  Gy  Projected Dose:56.0  Gy   Narrative:  The patient presents for routine under treatment assessment.  CBCT x-rays were reviewed.  The chart was checked.  She is doing well this week. She has minimal nausea. The patient denies any significant fatigue. She denies any diarrhea.  Physical Findings: Weight: 177 lb 4.8 oz (80.423 kg). The lungs are clear. The heart has a regular rhythm and rate. The abdomen is soft and nontender with normal bowel sounds.  Impression:  The patient is tolerating radiation.  Plan:  Continue treatment as planned to 56 Gy.  -----------------------------------  Billie Lade, PhD, MD

## 2012-02-20 ENCOUNTER — Ambulatory Visit: Payer: Medicare Other

## 2012-02-21 ENCOUNTER — Ambulatory Visit
Admission: RE | Admit: 2012-02-21 | Discharge: 2012-02-21 | Disposition: A | Discharge status: Home / Self Care | Payer: Medicare Other | Source: Ambulatory Visit | Attending: Radiation Oncology | Admitting: Radiation Oncology

## 2012-02-21 ENCOUNTER — Ambulatory Visit: Payer: Medicare Other

## 2012-02-22 ENCOUNTER — Ambulatory Visit
Admission: RE | Admit: 2012-02-22 | Discharge: 2012-02-22 | Disposition: A | Discharge status: Home / Self Care | Payer: Medicare Other | Source: Ambulatory Visit | Attending: Radiation Oncology | Admitting: Radiation Oncology

## 2012-02-25 ENCOUNTER — Ambulatory Visit
Admission: RE | Admit: 2012-02-25 | Discharge: 2012-02-25 | Disposition: A | Discharge status: Home / Self Care | Payer: Medicare Other | Source: Ambulatory Visit | Attending: Radiation Oncology | Admitting: Radiation Oncology

## 2012-02-26 ENCOUNTER — Ambulatory Visit
Admission: RE | Admit: 2012-02-26 | Discharge: 2012-02-26 | Disposition: A | Discharge status: Home / Self Care | Payer: Medicare Other | Source: Ambulatory Visit | Attending: Radiation Oncology | Admitting: Radiation Oncology

## 2012-02-26 DIAGNOSIS — C549 Malignant neoplasm of corpus uteri, unspecified: Secondary | ICD-10-CM

## 2012-02-26 NOTE — Progress Notes (Signed)
Here today for put of pelvis/periaorta region.  She says that she did have some burning with urination but subsided with drinking more fluids.  she says she gets tired in the evenings.  No c/o pain or diarrhea

## 2012-02-26 NOTE — Progress Notes (Signed)
Weekly Management Note Current Dose: 40 Gy  Projected Dose: 50 Gy   Narrative:  The patient presents for routine under treatment assessment.  CBCT/MVCT images/Port film x-rays were reviewed.  The chart was checked. Some dysuria but not frequent enough that she wants a pill   Physical Findings: Appears well. No distress.  Impression:  The patient is tolerating radiation.  Plan:  Continue treatment as planned. Monitor dysuria.

## 2012-02-27 ENCOUNTER — Ambulatory Visit
Admission: RE | Admit: 2012-02-27 | Discharge: 2012-02-27 | Disposition: A | Discharge status: Home / Self Care | Payer: Medicare Other | Source: Ambulatory Visit | Attending: Radiation Oncology | Admitting: Radiation Oncology

## 2012-02-28 ENCOUNTER — Ambulatory Visit
Admission: RE | Admit: 2012-02-28 | Discharge: 2012-02-28 | Disposition: A | Discharge status: Home / Self Care | Payer: Medicare Other | Source: Ambulatory Visit | Attending: Radiation Oncology | Admitting: Radiation Oncology

## 2012-02-29 ENCOUNTER — Ambulatory Visit
Admission: RE | Admit: 2012-02-29 | Discharge: 2012-02-29 | Disposition: A | Discharge status: Home / Self Care | Payer: Medicare Other | Source: Ambulatory Visit | Attending: Radiation Oncology | Admitting: Radiation Oncology

## 2012-03-03 ENCOUNTER — Ambulatory Visit
Admission: RE | Admit: 2012-03-03 | Discharge: 2012-03-03 | Disposition: A | Discharge status: Home / Self Care | Payer: Medicare Other | Source: Ambulatory Visit | Attending: Radiation Oncology | Admitting: Radiation Oncology

## 2012-03-03 ENCOUNTER — Ambulatory Visit: Payer: Medicare Other | Admitting: Radiation Oncology

## 2012-03-04 ENCOUNTER — Encounter: Payer: Self-pay | Admitting: Radiation Oncology

## 2012-03-04 ENCOUNTER — Ambulatory Visit
Admission: RE | Admit: 2012-03-04 | Discharge: 2012-03-04 | Disposition: A | Discharge status: Home / Self Care | Payer: Medicare Other | Source: Ambulatory Visit | Attending: Radiation Oncology | Admitting: Radiation Oncology

## 2012-03-04 VITALS — BP 132/78 | HR 53 | Resp 18 | Wt 175.1 lb

## 2012-03-04 DIAGNOSIS — C772 Secondary and unspecified malignant neoplasm of intra-abdominal lymph nodes: Secondary | ICD-10-CM

## 2012-03-04 NOTE — Progress Notes (Signed)
   Department of Radiation Oncology  Phone:  [phone] Fax:        [phone]   Weekly Management Note Current Dose: 50  Gy  Projected Dose:56  Gy   Narrative:  The patient presents for routine under treatment assessment.  CBCT/MVCT images/Port film x-rays were reviewed.  The chart was checked. She continues to tolerate the radiation therapy well. She has noticed some mild queasiness in the afternoon but no significant nausea or emesis. She denies any problems with diarrhea. Her energy level is good. She denies any dysuria at this time.  Physical Findings: Weight: 175 lb 1.6 oz (79.425 kg). The abdomen is soft and nontender with normal bowel sounds.  Impression:  The patient is tolerating radiation.  Plan:  Continue treatment as planned.   -----------------------------------  Billie Lade, PhD, MD

## 2012-03-04 NOTE — Progress Notes (Signed)
Patient presents to the clinic today unaccompanied for a PUT with Dr. Roselind Messier. Patient is alert and oriented to person, place, and time. No distress noted. Steady gait noted. Pleasant affect noted. Patient denies pain at this time. Patient reports only occasional episodes of diarrhea. Patient denies burning with urination. Patient reports urine is clear, yellow and odorless. Patient denies perineal skin changes. Reported all findings to Dr. Roselind Messier.

## 2012-03-05 ENCOUNTER — Ambulatory Visit
Admission: RE | Admit: 2012-03-05 | Discharge: 2012-03-05 | Disposition: A | Discharge status: Home / Self Care | Payer: Medicare Other | Source: Ambulatory Visit | Attending: Radiation Oncology | Admitting: Radiation Oncology

## 2012-03-07 ENCOUNTER — Ambulatory Visit
Admission: RE | Admit: 2012-03-07 | Discharge: 2012-03-07 | Disposition: A | Discharge status: Home / Self Care | Payer: Medicare Other | Source: Ambulatory Visit | Attending: Radiation Oncology | Admitting: Radiation Oncology

## 2012-03-10 ENCOUNTER — Ambulatory Visit: Payer: Medicare Other | Admitting: Radiation Oncology

## 2012-03-10 ENCOUNTER — Encounter: Payer: Self-pay | Admitting: Radiation Oncology

## 2012-03-10 ENCOUNTER — Ambulatory Visit
Admission: RE | Admit: 2012-03-10 | Discharge: 2012-03-10 | Disposition: A | Discharge status: Home / Self Care | Payer: Medicare Other | Source: Ambulatory Visit | Attending: Radiation Oncology | Admitting: Radiation Oncology

## 2012-03-10 VITALS — BP 137/71 | HR 54 | Temp 97.0°F | Resp 20 | Wt 174.5 lb

## 2012-03-10 DIAGNOSIS — C772 Secondary and unspecified malignant neoplasm of intra-abdominal lymph nodes: Secondary | ICD-10-CM

## 2012-03-10 NOTE — Progress Notes (Signed)
   Weekly Management Note Completed Radiotherapy. Total Dose: 56 Gy   Narrative:  The patient presents for routine under treatment assessment on last day of radiotherapy.  CBCT/MVCT images/Port film x-rays were reviewed.  The chart was checked. She is doing well. She has had some transient nausea. Denies any significant diarrhea, but does have Imodium in case it occurs  Physical Findings:  weight is 174 lb 8 oz (79.153 kg). Her oral temperature is 97 F (36.1 C). Her blood pressure is 137/71 and her pulse is 54. Her respiration is 20.  sitting comfortably in a chair in no acute distress.  Impression:  The patient has tolerated radiotherapy.  Plan:  Routine follow-up in one month. ________________________________   Lonie Peak, M.D.

## 2012-03-10 NOTE — Progress Notes (Signed)
Patient arrived, alert,oriented x3, no c/o pain, end of treatment,  A little nauseated this am,resolved, patient denies any diarrhea, eating ok"i eat enough" and drinking fluids stated, 1 month f/u appt card givrn10:00 AM

## 2012-03-11 NOTE — Progress Notes (Signed)
  Radiation Oncology         [phone] ________________________________  Name: [name] MRN: [id number]  Date: 03/10/2012  DOB: 1941-01-19  End of Treatment Note  Diagnosis:   Recurrent endometrial cancer     Indication for treatment:  Isolated recurrence in the periaortic area       Radiation treatment dates:   01/29/2012 through 03/10/2012  Site/dose:   Periaortic area at 5600 cGy in 28 fractions  Beams/energy:   IMRT using rapid arc therapy with 6 MV photons.  Narrative: The patient tolerated radiation treatment relatively well.   She had minimal nausea and diarrhea during the course of her treatment. She had a minimal fatigue in addition.  Plan: The patient has completed radiation treatment. The patient will return to radiation oncology clinic for routine followup in one month. I advised them to call or return sooner if they have any questions or concerns related to their recovery or treatment.  -----------------------------------  Billie Lade, PhD, MD

## 2012-03-30 ENCOUNTER — Encounter: Payer: Self-pay | Admitting: Radiation Oncology

## 2012-03-30 NOTE — Progress Notes (Signed)
   Department of Radiation Oncology  Phone:  [phone] Fax:        [phone]   I MRT simulation note  On 01/23/2012 patient's IMRT plan was reviewed and accepted for treatment. She will be treated with volumetric ARC therapy and using 2 rapid arcs. 6 MV photons will be used to deliver the patient's treatment. Dose volume histograms of target areas as well as critical structures are reviewed accepted and placed in the patient's chart.

## 2012-03-30 NOTE — Progress Notes (Signed)
   Department of Radiation Oncology  Phone:  [phone] Fax:        [phone]   IM RT device note  On may 28th the patient had development of her IMR Tdevice. she will be treated with 2 rapid arcs on the true beam STX. This constitutes 1 IM RT device.

## 2012-04-11 ENCOUNTER — Ambulatory Visit: Payer: Medicare Other | Admitting: Radiation Oncology

## 2012-04-16 ENCOUNTER — Ambulatory Visit: Payer: Medicare Other | Admitting: Radiation Oncology

## 2012-04-27 ENCOUNTER — Encounter: Payer: Self-pay | Admitting: Radiation Oncology

## 2012-04-28 ENCOUNTER — Ambulatory Visit
Admission: RE | Admit: 2012-04-28 | Discharge: 2012-04-28 | Disposition: A | Discharge status: Home / Self Care | Payer: Medicare Other | Source: Ambulatory Visit | Attending: Radiation Oncology | Admitting: Radiation Oncology

## 2012-04-28 ENCOUNTER — Encounter: Payer: Self-pay | Admitting: Radiation Oncology

## 2012-04-28 VITALS — BP 135/84 | HR 52 | Temp 97.5°F | Resp 18 | Wt 174.9 lb

## 2012-04-28 DIAGNOSIS — C772 Secondary and unspecified malignant neoplasm of intra-abdominal lymph nodes: Secondary | ICD-10-CM

## 2012-04-28 NOTE — Progress Notes (Signed)
Patient presents to the clinic today unaccompanied for a follow up appointment with Dr. Roselind Messier. Patient is alert and oriented to person, place, and time. No distress noted. Steady gait noted. Pleasant affect noted.  Patient reports that fecal incontinence continues. Patient reports right hip pain related to effects of arthritis for which Tylenol relieves. Patient denies nausea and vomiting. Patient reports occasional dizziness. Patient reports clear yellow odorless urine. Patient denies seeing blood in her stool or urine. Patient reports her energy level is "pretty good." Patient last seen by her PCP in January. Patient due for mammograms in September. Reported all findings to Dr. Roselind Messier.

## 2012-04-28 NOTE — Progress Notes (Signed)
Radiation Oncology         [phone] ________________________________  Name: [name] MRN: [id number]  Date: 04/28/2012  DOB: Feb 25, 1941  Follow-Up Visit Note  CC: Junious Silk, MD  [place], Veverly Fells, MD  Diagnosis:   Recurrent endometrial cancer with isolated recurrence in the periaortic area  Interval Since Last Radiation:  6 weeks  Narrative:  The patient returns today for routine follow-up.  She seems to be recovering from her radiation therapy. Patient has less fatigue. She denies any nausea at this time. She denies any diarrhea. She will occasionally  have some stool incontinence related to urgency issues.                            ALLERGIES:   has no known allergies.  Meds: Current Outpatient Prescriptions  Medication Sig Dispense Refill  . ACTONEL 35 MG tablet every 7 (seven) days.       Marland Kitchen aluminum hydroxide-magnesium carbonate (GAVISCON) 31.7-137 MG/5ML suspension Take 15 mLs by mouth every 6 (six) hours as needed.        Marland Kitchen aluminum-magnesium hydroxide-simethicone (MAALOX) 200-200-20 MG/5ML SUSP Take 15 mLs by mouth as needed.        Marland Kitchen amoxicillin (AMOXIL) 500 MG capsule       . Calcium Carbonate (CALTRATE 600 PO) Take 1 tablet by mouth 3 (three) times daily.       . calcium carbonate (TUMS - DOSED IN MG ELEMENTAL CALCIUM) 500 MG chewable tablet Chew 1 tablet by mouth as needed.        . chlorhexidine (PERIDEX) 0.12 % solution       . cholecalciferol (VITAMIN D) 1000 UNITS tablet Take 1,000 Units by mouth daily.        Marland Kitchen HYDROcodone-acetaminophen (NORCO) 5-325 MG per tablet       . multivitamin (THERAGRAN) per tablet Take 1 tablet by mouth daily. 50+ advantage       . NEXIUM 40 MG capsule       . oxyCODONE-acetaminophen (PERCOCET) 5-325 MG per tablet Take 1 tablet by mouth every 6 (six) hours as needed.        . propranolol (INNOPRAN XL) 80 MG 24 hr capsule Take 80 mg by mouth at bedtime.       . propranolol ER (INDERAL LA) 80 MG 24 hr capsule       .  rosuvastatin (CRESTOR) 20 MG tablet Take 20 mg by mouth daily.        . vitamin C (ASCORBIC ACID) 500 MG tablet Take 500 mg by mouth daily.        Marland Kitchen zinc gluconate 50 MG tablet Take 50 mg by mouth daily.          Physical Findings: The patient is in no acute distress. Patient is alert and oriented.  weight is 174 lb 14.4 oz (79.334 kg). Her oral temperature is 97.5 F (36.4 C). Her blood pressure is 135/84 and her pulse is 52. Her respiration is 18. .  The lungs are clear. The heart has a regular rhythm and rate. The abdomen is soft nontender with normal bowel sounds. There is no supraclavicular adenopathy.  Lab Findings: Lab Results  Component Value Date   WBC 6.8 06/08/2011   HGB 13.6 06/08/2011   HCT 40.7 06/08/2011   MCV 96.0 06/08/2011   PLT 225 06/08/2011    @LASTCHEM @  Radiographic Findings: No results found.  Impression:  The patient is  recovering from the effects of radiation.    Plan:  Routine followup in 5 months. she will be seen in gynecologic oncology in approximately 3 months.  _____________________________________    Billie Lade, PhD, MD

## 2012-07-04 ENCOUNTER — Ambulatory Visit: Payer: Medicare Other | Attending: Gynecology | Admitting: Gynecology

## 2012-07-04 ENCOUNTER — Encounter: Payer: Self-pay | Admitting: Gynecology

## 2012-07-04 VITALS — BP 110/62 | HR 60 | Temp 98.2°F | Resp 18 | Ht 64.0 in | Wt 176.6 lb

## 2012-07-04 DIAGNOSIS — Z90710 Acquired absence of both cervix and uterus: Secondary | ICD-10-CM | POA: Insufficient documentation

## 2012-07-04 DIAGNOSIS — C549 Malignant neoplasm of corpus uteri, unspecified: Secondary | ICD-10-CM | POA: Insufficient documentation

## 2012-07-04 DIAGNOSIS — C541 Malignant neoplasm of endometrium: Secondary | ICD-10-CM

## 2012-07-04 DIAGNOSIS — K219 Gastro-esophageal reflux disease without esophagitis: Secondary | ICD-10-CM | POA: Insufficient documentation

## 2012-07-04 DIAGNOSIS — Z8489 Family history of other specified conditions: Secondary | ICD-10-CM | POA: Insufficient documentation

## 2012-07-04 DIAGNOSIS — Z9079 Acquired absence of other genital organ(s): Secondary | ICD-10-CM | POA: Insufficient documentation

## 2012-07-04 DIAGNOSIS — Z8249 Family history of ischemic heart disease and other diseases of the circulatory system: Secondary | ICD-10-CM | POA: Insufficient documentation

## 2012-07-04 DIAGNOSIS — Z923 Personal history of irradiation: Secondary | ICD-10-CM | POA: Insufficient documentation

## 2012-07-04 NOTE — Progress Notes (Signed)
Consult Note: Gyn-Onc   [name] 71 y.o. female  Chief Complaint  Patient presents with  . Endo ca    Follow up    Interval History: The patient returns for her scheduled followup. She completed radiation therapy to the periaortic chain in July of 2013. She apparently tolerated radiation well with no significant GI symptoms. Overall her functional status is excellent. She has no GI GU or pelvic symptoms.  [id number] was initially diagnosed endometrial cancer in June 2009. She underwent a TAH BSO. 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 (Stage IA grade 1).  Patient was followed until a nodule in the upper vagina was noted and confirmed by biopsy in September 2012 to be recurrent disease. As further staging the patient had a PET scan in October which showed hypermetabolic pelvic lymph nodes. The right external iliac node measuring 1.9 cm and a left posterior obturator node measured 0.5 cm. The patient was then treated with whole pelvis radiation therapy and vaginal brachytherapy. Followup PET scan showed resolution of the pelvic adenopathy, however, a new periaortic lymph node was identified. Subsequently the periaortic chain was radiated. She tolerated this therapy well.     Review of Systems:10 point review of systems is negative as noted above.   Vitals: Blood pressure 110/62, pulse 60, temperature 98.2 F (36.8 C), temperature source Oral, resp. rate 18, height 5\' 4"  (1.626 m), weight 176 lb 9.6 oz (80.105 kg).  Physical Exam: General : The patient is a healthy woman in no acute distress.  HEENT: normocephalic, extraoccular movements normal; neck is supple without thyromegally  Lynphnodes: Supraclavicular and inguinal nodes not enlarged  Abdomen: Soft, non-tender, no ascites, no organomegally, no masses, no hernias  Pelvic:  EGBUS: Normal female  Vagina: Normal, no lesions  Urethra and Bladder: Normal,  non-tender moderate cystocele Cervix: Surgically absent  Uterus: Surgically absent  Bi-manual examination: Non-tender; no adenxal masses or nodularity  Rectal: normal sphincter tone, no masses, no blood  Lower extremities: No edema or varicosities. Normal range of motion    Assessment/Plan: Recurrent endometrial carcinoma and pelvic and periaortic lymph nodes. She is now status post radiation therapy. The last radiation therapy was completed in July of 2013. We'll schedule it PET scan in the near future to assess disease status.  Going forward it may be reasonable place the patient on Megace and given that this was a well differentiated carcinoma. We'll make that decision after we see the results of her PET scan.  No Known Allergies  Past Medical History  Diagnosis Date  . Endometrial cancer   . Migraines   . Hyperlipidemia   . GERD (gastroesophageal reflux disease)   . Arthritis   . Radiation 10/2011    External Beam/Intracavitary  . S/P radiation therapy 01/29/12 - 03/10/12    Periaortic area/ 5600 cgy/ 28 Fractions    Past Surgical History  Procedure Date  . Total abdominal hysterectomy w/ bilateral salpingoophorectomy 2009  . Cholecystectomy     Current Outpatient Prescriptions  Medication Sig Dispense Refill  . ACTONEL 35 MG tablet every 7 (seven) days.       Marland Kitchen aluminum hydroxide-magnesium carbonate (GAVISCON) 31.7-137 MG/5ML suspension Take 15 mLs by mouth every 6 (six) hours as needed.        Marland Kitchen aluminum-magnesium hydroxide-simethicone (MAALOX) 200-200-20 MG/5ML SUSP Take 15 mLs by mouth as needed.        Marland Kitchen amoxicillin (AMOXIL) 500 MG capsule       .  Calcium Carbonate (CALTRATE 600 PO) Take 1 tablet by mouth 3 (three) times daily.       . calcium carbonate (TUMS - DOSED IN MG ELEMENTAL CALCIUM) 500 MG chewable tablet Chew 1 tablet by mouth as needed.        . chlorhexidine (PERIDEX) 0.12 % solution       . cholecalciferol (VITAMIN D) 1000 UNITS tablet Take 1,000 Units by  mouth daily.        Marland Kitchen HYDROcodone-acetaminophen (NORCO) 5-325 MG per tablet       . multivitamin (THERAGRAN) per tablet Take 1 tablet by mouth daily. 50+ advantage       . NEXIUM 40 MG capsule       . oxyCODONE-acetaminophen (PERCOCET) 5-325 MG per tablet Take 1 tablet by mouth every 6 (six) hours as needed.        . propranolol (INNOPRAN XL) 80 MG 24 hr capsule Take 80 mg by mouth at bedtime.       . propranolol ER (INDERAL LA) 80 MG 24 hr capsule       . rosuvastatin (CRESTOR) 20 MG tablet Take 20 mg by mouth daily.        . vitamin C (ASCORBIC ACID) 500 MG tablet Take 500 mg by mouth daily.        Marland Kitchen zinc gluconate 50 MG tablet Take 50 mg by mouth daily.          History   Social History  . Marital Status: Single    Spouse Name: N/A    Number of Children: N/A  . Years of Education: N/A   Occupational History  . Not on file.   Social History Main Topics  . Smoking status: Never Smoker   . Smokeless tobacco: Never Used  . Alcohol Use: No  . Drug Use: Not on file  . Sexually Active: Not on file   Other Topics Concern  . Not on file   Social History Narrative  . No narrative on file    Family History  Problem Relation Age of Onset  . Heart failure Mother   . Ulcers Father       Jeannette Corpus, MD 07/04/2012, 1:58 PM

## 2012-07-04 NOTE — Patient Instructions (Signed)
We will schedule a PET scan to reevaluate.

## 2012-07-09 ENCOUNTER — Telehealth: Payer: Self-pay | Admitting: *Deleted

## 2012-07-09 ENCOUNTER — Other Ambulatory Visit: Payer: Self-pay | Admitting: *Deleted

## 2012-07-09 DIAGNOSIS — C541 Malignant neoplasm of endometrium: Secondary | ICD-10-CM

## 2012-07-09 DIAGNOSIS — F22 Delusional disorders: Secondary | ICD-10-CM

## 2012-07-09 NOTE — Telephone Encounter (Signed)
Pt notified of PET scheduled 07/22/12 @ [facility] @ 0815. NPO after MN.  Hold AM meds until after test.

## 2012-07-22 ENCOUNTER — Ambulatory Visit (HOSPITAL_COMMUNITY)
Admission: RE | Admit: 2012-07-22 | Discharge: 2012-07-22 | Disposition: A | Discharge status: Home / Self Care | Payer: Medicare Other | Source: Ambulatory Visit | Attending: Gynecology | Admitting: Gynecology

## 2012-07-22 DIAGNOSIS — Z9089 Acquired absence of other organs: Secondary | ICD-10-CM | POA: Insufficient documentation

## 2012-07-22 DIAGNOSIS — M19019 Primary osteoarthritis, unspecified shoulder: Secondary | ICD-10-CM | POA: Insufficient documentation

## 2012-07-22 DIAGNOSIS — C774 Secondary and unspecified malignant neoplasm of inguinal and lower limb lymph nodes: Secondary | ICD-10-CM | POA: Insufficient documentation

## 2012-07-22 DIAGNOSIS — M899 Disorder of bone, unspecified: Secondary | ICD-10-CM | POA: Insufficient documentation

## 2012-07-22 DIAGNOSIS — C549 Malignant neoplasm of corpus uteri, unspecified: Secondary | ICD-10-CM | POA: Insufficient documentation

## 2012-07-22 DIAGNOSIS — C541 Malignant neoplasm of endometrium: Secondary | ICD-10-CM

## 2012-07-22 DIAGNOSIS — J3489 Other specified disorders of nose and nasal sinuses: Secondary | ICD-10-CM | POA: Insufficient documentation

## 2012-07-22 DIAGNOSIS — Z90710 Acquired absence of both cervix and uterus: Secondary | ICD-10-CM | POA: Insufficient documentation

## 2012-07-22 LAB — GLUCOSE, CAPILLARY: Glucose-Capillary: 100 mg/dL — ABNORMAL HIGH (ref 70–99)

## 2012-07-22 MED ORDER — FLUDEOXYGLUCOSE F - 18 (FDG) INJECTION
15.8000 | Freq: Once | INTRAVENOUS | Status: AC | PRN
  Administered 2012-07-22: 15.8 via INTRAVENOUS

## 2012-07-28 ENCOUNTER — Telehealth: Payer: Self-pay | Admitting: *Deleted

## 2012-07-28 NOTE — Telephone Encounter (Signed)
Pt notified of CT resluts of notified of PET resluts

## 2012-09-29 ENCOUNTER — Other Ambulatory Visit (HOSPITAL_COMMUNITY)
Admission: RE | Admit: 2012-09-29 | Discharge: 2012-09-29 | Disposition: A | Discharge status: Home / Self Care | Payer: Medicare Other | Source: Ambulatory Visit | Attending: Radiation Oncology | Admitting: Radiation Oncology

## 2012-09-29 ENCOUNTER — Encounter: Payer: Self-pay | Admitting: Radiation Oncology

## 2012-09-29 ENCOUNTER — Ambulatory Visit
Admission: RE | Admit: 2012-09-29 | Discharge: 2012-09-29 | Disposition: A | Discharge status: Home / Self Care | Payer: Medicare Other | Source: Ambulatory Visit | Attending: Radiation Oncology | Admitting: Radiation Oncology

## 2012-09-29 VITALS — BP 122/55 | HR 49 | Temp 98.0°F | Resp 18 | Wt 179.9 lb

## 2012-09-29 DIAGNOSIS — C772 Secondary and unspecified malignant neoplasm of intra-abdominal lymph nodes: Secondary | ICD-10-CM | POA: Insufficient documentation

## 2012-09-29 DIAGNOSIS — Z124 Encounter for screening for malignant neoplasm of cervix: Secondary | ICD-10-CM | POA: Insufficient documentation

## 2012-09-29 NOTE — Addendum Note (Signed)
Encounter addended by: Billie Lade, MD on: 09/29/2012  2:30 PM<BR>     Documentation filed: Orders

## 2012-09-29 NOTE — Progress Notes (Signed)
Patient presents to the clinic today unaccompanied for follow up appointment with Dr. Roselind Messier. Patient alert and oriented to person, place, and time. No distress noted. Steady gait noted. Pleasant affect noted. Patient denies pain at this time. Patient reports that frequently following meals she has diarrhea and "feels sick on her stomach." Patient denies emesis. Patient denies headache, dizziness or night sweats. Patient denies vaginal discharge or bleeding. Patient reports that stool incontinence continues. Patient reports energy is up. Reported all findings to Dr. Roselind Messier.

## 2012-09-29 NOTE — Progress Notes (Signed)
Radiation Oncology         [phone] ________________________________  Name: [name] MRN: [id number]  Date: 09/29/2012  DOB: April 19, 1941  Follow-Up Visit Note  CC: Junious Silk, MD  Stanford Breed Daniel *  Diagnosis:   Recurrent endometrial cancer with isolated recurrence in the periaortic area  Interval Since Last Radiation:  18 months  Narrative:  The patient returns today for routine follow-up.  She seems to be doing well except for occasional episodes of nausea and diarrhea related to food intake. This is occasional at this time and the patient is not related to any particular food. She does have a history of dyspepsia but is on no medications for this issue. Recommend she followup with her primary care physician concerning this issue. She denies any severe abdominal pain or cramping.  she denies any flank pain or vaginal bleeding. Patient denies any rectal bleeding or hematuria.                              ALLERGIES:   has no known allergies.  Meds: Current Outpatient Prescriptions  Medication Sig Dispense Refill  . ACTONEL 35 MG tablet every 7 (seven) days.       Marland Kitchen aluminum hydroxide-magnesium carbonate (GAVISCON) 31.7-137 MG/5ML suspension Take 15 mLs by mouth every 6 (six) hours as needed.        Marland Kitchen aluminum-magnesium hydroxide-simethicone (MAALOX) 200-200-20 MG/5ML SUSP Take 15 mLs by mouth as needed.        . Calcium Carbonate (CALTRATE 600 PO) Take 1 tablet by mouth 3 (three) times daily.       . calcium carbonate (TUMS - DOSED IN MG ELEMENTAL CALCIUM) 500 MG chewable tablet Chew 1 tablet by mouth as needed.        . chlorhexidine (PERIDEX) 0.12 % solution       . cholecalciferol (VITAMIN D) 1000 UNITS tablet Take 1,000 Units by mouth daily.        . multivitamin (THERAGRAN) per tablet Take 1 tablet by mouth daily. 50+ advantage       . NEXIUM 40 MG capsule       . oxyCODONE-acetaminophen (PERCOCET) 5-325 MG per tablet Take 1 tablet by mouth every 6 (six)  hours as needed.        . propranolol (INNOPRAN XL) 80 MG 24 hr capsule Take 80 mg by mouth at bedtime.       . propranolol ER (INDERAL LA) 80 MG 24 hr capsule       . rosuvastatin (CRESTOR) 20 MG tablet Take 20 mg by mouth daily.        . vitamin C (ASCORBIC ACID) 500 MG tablet Take 500 mg by mouth daily.        Marland Kitchen zinc gluconate 50 MG tablet Take 50 mg by mouth daily.        Marland Kitchen amoxicillin (AMOXIL) 500 MG capsule       . HYDROcodone-acetaminophen (NORCO) 5-325 MG per tablet         Physical Findings: The patient is in no acute distress. Patient is alert and oriented.  weight is 179 lb 14.4 oz (81.602 kg). Her oral temperature is 98 F (36.7 C). Her blood pressure is 122/55 and her pulse is 49. Her respiration is 18. Marland Kitchen  No palpable supraclavicular or axillary adenopathy. The lungs are clear to auscultation. The heart has regular rhythm and rate. The abdomen is soft and nontender with normal  bowel sounds. The inguinal areas are free of adenopathy. On pelvic examination the external genitalia are unremarkable. A speculum exam is performed. There no mucosal lesions noted in the vaginal vault. A Pap smear was obtained of the proximal vagina. On bimanual and rectovaginal examination there note pelvic masses appreciated.  Lab Findings: Lab Results  Component Value Date   WBC 6.8 06/08/2011   HGB 13.6 06/08/2011   HCT 40.7 06/08/2011   MCV 96.0 06/08/2011   PLT 225 06/08/2011    @LASTCHEM @  Radiographic Findings: Patient did undergo PET scan 07/22/2012 There is no signs of residual cancer or new problems on her PET scan.                                                                                                                                                                                         Impression:  No evidence for recurrence on clinical exam today, Pap smear pending  Plan:  Routine followup in 6 months. I have asked patient to schedule an appointment in gynecologic oncology for 3  months.  _____________________________________  -----------------------------------  Billie Lade, PhD, MD

## 2012-10-06 ENCOUNTER — Telehealth: Payer: Self-pay | Admitting: Radiation Oncology

## 2012-10-06 NOTE — Telephone Encounter (Signed)
Phoned patient per Dr. Trina Ao order informing her that her paps smear results were good. Patient verbalized understanding and expressed appreciation for the call.

## 2012-10-06 NOTE — Telephone Encounter (Signed)
Message copied by Agnes Lawrence on Mon Oct 06, 2012  1:14 PM ------      Message from: Antony Blackbird D      Created: Mon Oct 06, 2012  8:21 AM       Sam,                    Please inform pt. Of good pap smear results.                                                                                    Thanks, jk      ----- Message -----         From: [facility] Interface         Sent: 10/06/2012   7:40 AM           To: Billie Lade, MD

## 2012-12-16 ENCOUNTER — Encounter: Payer: Self-pay | Admitting: Gynecologic Oncology

## 2012-12-16 ENCOUNTER — Ambulatory Visit: Payer: Medicare Other | Attending: Gynecologic Oncology | Admitting: Gynecologic Oncology

## 2012-12-16 VITALS — BP 128/66 | HR 66 | Temp 98.5°F | Resp 16 | Ht 64.0 in | Wt 180.2 lb

## 2012-12-16 DIAGNOSIS — C549 Malignant neoplasm of corpus uteri, unspecified: Secondary | ICD-10-CM | POA: Insufficient documentation

## 2012-12-16 DIAGNOSIS — C541 Malignant neoplasm of endometrium: Secondary | ICD-10-CM

## 2012-12-16 NOTE — Patient Instructions (Signed)
Please keep your scheduled appointments

## 2012-12-16 NOTE — Progress Notes (Signed)
Consult Note: Gyn-Onc   [name] 72 y.o. female  Chief Complaint  Patient presents with  . Endometrial cancer    Follow up    Interval History: The patient returns for her scheduled followup. She completed radiation therapy to the periaortic chain in July of 2013. She apparently tolerated radiation well with no significant GI symptoms. Overall her functional status is excellent. She has no GI GU or pelvic symptoms.  [id number] was initially diagnosed endometrial cancer in June 2009. She underwent a TAH BSO. 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 (Stage IA grade 1). Patient was followed until a nodule in the upper vagina was noted and confirmed by biopsy in September 2012 to be recurrent disease. As further staging the patient had a PET scan in October 2012 which showed hypermetabolic pelvic lymph nodes. The right external iliac node measuring 1.9 cm and a left posterior obturator node measured 0.5 cm. The patient was then treated with whole pelvis radiation therapy and vaginal brachytherapy. Followup PET scan showed resolution of the pelvic adenopathy, however, a new periaortic lymph node was identified. Subsequently the periaortic chain was radiated. She tolerated this therapy well. She was last seen by Dr. Stanford Breed November 2013. She had PET scan in November that was negative for recurrent disease. She was seen by Dr. Roselind Messier in January of 2014. At that time her exam and Pap smear were both negative. She comes in today for another followup.  She states that she will be due for colonoscopy next year as her last one was in 2010. She is up-to-date on her mammograms.  Review of Systems: She states that she complains of some general body soreness from doing yard work over the weekend. She denies any vaginal bleeding or any abdominal or pelvic pain. She denies any specific changes in her bowel or bladder habits she stay she's  had diarrhea to soft stools since her cholecystectomy. She does complain of occasional chest tightness across her upper chest. She stay she's discuss this with her primary care physician and feel it is due to reflux. They're not alarmed by it. She occasionally has some shortness of breath but can't really state if it's associated with chest tightness. She denies any radiation of discomfort with her chest tightness and she denies any diaphoresis. She denies any significant unintentional weight loss or weight gain. She denies any headaches or visual changes. 10 point review of systems is otherwise negative..  Vitals: Blood pressure 128/66, pulse 66, temperature 98.5 F (36.9 C), resp. rate 16, height 5\' 4"  (1.626 m), weight 180 lb 3.2 oz (81.738 kg).  Physical Exam: General : The patient is a healthy woman in no acute distress.   HEENT: normocephalic, extraoccular movements normal; neck is supple without thyromegally   Lynphnodes: Supraclavicular and inguinal nodes not enlarged   Abdomen: Soft, non-tender, no ascites, no organomegally, no masses, no hernias   Pelvic:  EGBUS: Normal female  Vagina: Normal, no lesions  Urethra and Bladder: Normal, non-tender moderate cystocele Cervix: Surgically absent  Uterus: Surgically absent  Bi-manual examination: Non-tender; no adenxal masses or nodularity  Rectal: normal sphincter tone, no masses  Lower extremities: No edema or varicosities. Normal range of motion    Assessment/Plan: Recurrent endometrial carcinoma and pelvic and periaortic lymph nodes. She is now status post radiation therapy. The last radiation therapy was completed in July of 2013. She had a PET scan in November of 2013 that was negative for recurrent disease. We'll  schedule it PET scan in the near future to assess disease status. She has a followup appointment Dr. Roselind Messier in July and will return to see [place] in October P.   No Known Allergies  Past Medical History  Diagnosis Date   . Endometrial cancer   . Migraines   . Hyperlipidemia   . GERD (gastroesophageal reflux disease)   . Arthritis   . Radiation 10/2011    External Beam/Intracavitary  . S/P radiation therapy 01/29/12 - 03/10/12    Periaortic area/ 5600 cgy/ 28 Fractions    Past Surgical History  Procedure Laterality Date  . Total abdominal hysterectomy w/ bilateral salpingoophorectomy  2009  . Cholecystectomy      Current Outpatient Prescriptions  Medication Sig Dispense Refill  . ACTONEL 35 MG tablet every 7 (seven) days.       Marland Kitchen aluminum hydroxide-magnesium carbonate (GAVISCON) 31.7-137 MG/5ML suspension Take 15 mLs by mouth every 6 (six) hours as needed.        Marland Kitchen aluminum-magnesium hydroxide-simethicone (MAALOX) 200-200-20 MG/5ML SUSP Take 15 mLs by mouth as needed.        . Calcium Carbonate (CALTRATE 600 PO) Take 1 tablet by mouth 3 (three) times daily.       . calcium carbonate (TUMS - DOSED IN MG ELEMENTAL CALCIUM) 500 MG chewable tablet Chew 1 tablet by mouth as needed.        . chlorhexidine (PERIDEX) 0.12 % solution       . cholecalciferol (VITAMIN D) 1000 UNITS tablet Take 1,000 Units by mouth daily.        . multivitamin (THERAGRAN) per tablet Take 1 tablet by mouth daily. 50+ advantage       . NEXIUM 40 MG capsule       . propranolol (INNOPRAN XL) 80 MG 24 hr capsule Take 80 mg by mouth at bedtime.       . rosuvastatin (CRESTOR) 20 MG tablet Take 20 mg by mouth daily.        . vitamin C (ASCORBIC ACID) 500 MG tablet Take 500 mg by mouth daily.        Marland Kitchen zinc gluconate 50 MG tablet Take 50 mg by mouth daily.        Marland Kitchen amoxicillin (AMOXIL) 500 MG capsule       . HYDROcodone-acetaminophen (NORCO) 5-325 MG per tablet       . oxyCODONE-acetaminophen (PERCOCET) 5-325 MG per tablet Take 1 tablet by mouth every 6 (six) hours as needed.        . propranolol ER (INDERAL LA) 80 MG 24 hr capsule        No current facility-administered medications for this visit.    History   Social History  .  Marital Status: Single    Spouse Name: N/A    Number of Children: N/A  . Years of Education: N/A   Occupational History  . Not on file.   Social History Main Topics  . Smoking status: Never Smoker   . Smokeless tobacco: Never Used  . Alcohol Use: No  . Drug Use: Not on file  . Sexually Active: Not on file   Other Topics Concern  . Not on file   Social History Narrative  . No narrative on file    Family History  Problem Relation Age of Onset  . Heart failure Mother   . Ulcers Father       Cleda Mccreedy A., MD 12/16/2012, 10:31 AM

## 2012-12-18 ENCOUNTER — Telehealth: Payer: Self-pay | Admitting: Gynecologic Oncology

## 2012-12-18 ENCOUNTER — Other Ambulatory Visit: Payer: Self-pay | Admitting: Gynecologic Oncology

## 2012-12-18 DIAGNOSIS — C772 Secondary and unspecified malignant neoplasm of intra-abdominal lymph nodes: Secondary | ICD-10-CM

## 2012-12-18 DIAGNOSIS — C549 Malignant neoplasm of corpus uteri, unspecified: Secondary | ICD-10-CM

## 2012-12-18 NOTE — Telephone Encounter (Signed)
Patient informed that her insurance company denied covering a PET scan recommended by Dr. Duard Brady for evaluation of disease status after two recurrences of endometrial cancer.  A peer to peer was performed but the PET scan was still denied.  Instructed that Dr. Duard Brady was notified via e-mail and that she would be notified of further recommendations or alternative tests.  Verbalizing understanding.  Instructed that her appt for the PET scan on 12/19/12 would be cancelled.  Instructed to call for any needs.

## 2012-12-19 ENCOUNTER — Ambulatory Visit (HOSPITAL_COMMUNITY): Admission: RE | Admit: 2012-12-19 | Payer: Medicare Other | Source: Ambulatory Visit

## 2012-12-22 ENCOUNTER — Encounter: Payer: Self-pay | Admitting: Lab

## 2012-12-23 ENCOUNTER — Other Ambulatory Visit: Payer: Medicare Other | Admitting: Lab

## 2012-12-23 DIAGNOSIS — C549 Malignant neoplasm of corpus uteri, unspecified: Secondary | ICD-10-CM

## 2012-12-25 ENCOUNTER — Ambulatory Visit (HOSPITAL_COMMUNITY)
Admission: RE | Admit: 2012-12-25 | Discharge: 2012-12-25 | Disposition: A | Discharge status: Home / Self Care | Payer: Medicare Other | Source: Ambulatory Visit | Attending: Gynecologic Oncology | Admitting: Gynecologic Oncology

## 2012-12-25 ENCOUNTER — Encounter (HOSPITAL_COMMUNITY): Payer: Self-pay

## 2012-12-25 DIAGNOSIS — C772 Secondary and unspecified malignant neoplasm of intra-abdominal lymph nodes: Secondary | ICD-10-CM

## 2012-12-25 DIAGNOSIS — C549 Malignant neoplasm of corpus uteri, unspecified: Secondary | ICD-10-CM | POA: Insufficient documentation

## 2012-12-25 DIAGNOSIS — C55 Malignant neoplasm of uterus, part unspecified: Secondary | ICD-10-CM | POA: Insufficient documentation

## 2012-12-25 MED ORDER — IOHEXOL 300 MG/ML  SOLN
100.0000 mL | Freq: Once | INTRAMUSCULAR | Status: AC | PRN
  Administered 2012-12-25: 100 mL via INTRAVENOUS

## 2013-01-01 ENCOUNTER — Telehealth: Payer: Self-pay | Admitting: Gynecologic Oncology

## 2013-01-01 NOTE — Telephone Encounter (Signed)
Message left with results of CT scan: no evidence of recurrence or metastatic disease.  Instructed to call the office for any questions or concerns.

## 2013-03-27 DIAGNOSIS — C541 Malignant neoplasm of endometrium: Secondary | ICD-10-CM | POA: Insufficient documentation

## 2013-03-30 ENCOUNTER — Ambulatory Visit
Admission: RE | Admit: 2013-03-30 | Discharge: 2013-03-30 | Disposition: A | Discharge status: Home / Self Care | Payer: Medicare Other | Source: Ambulatory Visit | Attending: Radiation Oncology | Admitting: Radiation Oncology

## 2013-03-30 ENCOUNTER — Other Ambulatory Visit (HOSPITAL_COMMUNITY)
Admission: RE | Admit: 2013-03-30 | Discharge: 2013-03-30 | Disposition: A | Discharge status: Home / Self Care | Payer: Medicare Other | Source: Ambulatory Visit | Attending: Radiation Oncology | Admitting: Radiation Oncology

## 2013-03-30 ENCOUNTER — Telehealth: Payer: Self-pay | Admitting: *Deleted

## 2013-03-30 ENCOUNTER — Encounter: Payer: Self-pay | Admitting: Radiation Oncology

## 2013-03-30 VITALS — BP 126/50 | HR 54 | Temp 97.7°F | Ht 64.0 in | Wt 182.5 lb

## 2013-03-30 DIAGNOSIS — Z124 Encounter for screening for malignant neoplasm of cervix: Secondary | ICD-10-CM | POA: Insufficient documentation

## 2013-03-30 DIAGNOSIS — C541 Malignant neoplasm of endometrium: Secondary | ICD-10-CM

## 2013-03-30 NOTE — Progress Notes (Signed)
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  She most recently completed treatment to the periaortic area for recurrence in this region. She is doing well and without complaints. She denies any back or flank pain. She denies any pelvic pain vaginal bleeding or discharge. She has been inconsistent with her vaginal dilator use and have stressed the importance of this issue.   She did undergo CT scan of the abdomen and pelvis in the spring of this year showing no evidence of recurrence.                           ALLERGIES:  has No Known Allergies.  Meds: Current Outpatient Prescriptions  Medication Sig Dispense Refill  . ACTONEL 35 MG tablet every 7 (seven) days.       Marland Kitchen aluminum-magnesium hydroxide-simethicone (MAALOX) 200-200-20 MG/5ML SUSP Take 15 mLs by mouth as needed.        . Calcium Carbonate (CALTRATE 600 PO) Take 1 tablet by mouth 3 (three) times daily.       . calcium carbonate (TUMS - DOSED IN MG ELEMENTAL CALCIUM) 500 MG chewable tablet Chew 1 tablet by mouth as needed.        . cholecalciferol (VITAMIN D) 1000 UNITS tablet Take 1,000 Units by mouth daily.        Marland Kitchen esomeprazole (NEXIUM) 20 MG capsule Take 20 mg by mouth as needed.      . multivitamin (THERAGRAN) per tablet Take 1 tablet by mouth daily. 50+ advantage       . oxyCODONE-acetaminophen (PERCOCET) 5-325 MG per tablet Take 1 tablet by mouth every 6 (six) hours as needed.       . propranolol ER (INDERAL LA) 80 MG 24 hr capsule       . rosuvastatin (CRESTOR) 20 MG tablet Take 20 mg by mouth daily.        . vitamin C (ASCORBIC ACID) 500 MG tablet  Take 500 mg by mouth daily.        Marland Kitchen zinc gluconate 50 MG tablet Take 50 mg by mouth daily.         No current facility-administered medications for this encounter.    Physical Findings: The patient is in no acute distress. Patient is alert and oriented.  height is 5\' 4"  (1.626 m) and weight is 182 lb 8 oz (82.781 kg). Her temperature is 97.7 F (36.5 C). Her blood pressure is 126/50 and her pulse is 54. Marland Kitchen  No palpable supraclavicular or axillary adenopathy. The lungs are clear to auscultation. The heart has regular rhythm and rate. The abdomen is soft and nontender with normal bowel sounds. The inguinal areas are free of adenopathy. On pelvic examination the external genitalia are unremarkable. Speculum exam is performed. There some mild radiation changes noted in the proximal vagina. There are no mucosal lesions noted. A Pap smear was obtained of the proximal vagina. On bimanual and rectovaginal examination there no pelvic masses appreciated.  Lab Findings: Lab Results  Component Value Date   WBC 6.8 06/08/2011   HGB 13.6 06/08/2011  HCT 40.7 06/08/2011   MCV 96.0 06/08/2011   PLT 225 06/08/2011      Radiographic Findings: No results found.  Impression:  No evidence for recurrence on clinical exam today, Pap smear pending  Plan:  Routine followup in 6 months. Patient will be seen by gynecologic oncology in the fall and will likely undergo repeat scanning at that time.  _____________________________________  -----------------------------------  Billie Lade, PhD, MD

## 2013-03-30 NOTE — Progress Notes (Addendum)
[name] here today for assessment s/p  radiation for endometrial cancer.  She states that in April 2014 she was seen by Dr. Owens Shark and had a pap smear, but there is no evidence of this.  Pap smear report from January 2014 present.  CT Scan report attached from 12/25/12.  [name] denies any pain, or vaginal bleeding.  Denies any rectal irritation, but sttes she wears a pad because of stool leakage.

## 2013-03-30 NOTE — Telephone Encounter (Signed)
Spoke w/pt re: move FU appointment up today. Pt states she cannot be here earlier today.

## 2013-04-06 ENCOUNTER — Telehealth: Payer: Self-pay | Admitting: *Deleted

## 2013-04-06 NOTE — Telephone Encounter (Signed)
Per Dr Roselind Messier, called and spoke w/pt and informed her 03/30/13 pap smear results "are good". Pt verbalized understanding and thanked this RN for call.

## 2013-06-18 ENCOUNTER — Ambulatory Visit: Payer: Medicare Other | Admitting: Gynecologic Oncology

## 2013-07-16 ENCOUNTER — Ambulatory Visit: Payer: Medicare Other | Admitting: Lab

## 2013-07-16 ENCOUNTER — Other Ambulatory Visit (HOSPITAL_COMMUNITY)
Admission: RE | Admit: 2013-07-16 | Discharge: 2013-07-16 | Disposition: A | Discharge status: Home / Self Care | Payer: Medicare Other | Source: Ambulatory Visit | Attending: Gynecologic Oncology | Admitting: Gynecologic Oncology

## 2013-07-16 ENCOUNTER — Ambulatory Visit: Payer: Medicare Other | Attending: Gynecologic Oncology | Admitting: Gynecologic Oncology

## 2013-07-16 ENCOUNTER — Encounter: Payer: Self-pay | Admitting: Gynecologic Oncology

## 2013-07-16 VITALS — BP 135/62 | HR 60 | Temp 97.8°F | Resp 16 | Ht 64.0 in | Wt 180.9 lb

## 2013-07-16 DIAGNOSIS — C549 Malignant neoplasm of corpus uteri, unspecified: Secondary | ICD-10-CM | POA: Insufficient documentation

## 2013-07-16 DIAGNOSIS — C541 Malignant neoplasm of endometrium: Secondary | ICD-10-CM

## 2013-07-16 DIAGNOSIS — N8111 Cystocele, midline: Secondary | ICD-10-CM | POA: Insufficient documentation

## 2013-07-16 DIAGNOSIS — Z79899 Other long term (current) drug therapy: Secondary | ICD-10-CM | POA: Insufficient documentation

## 2013-07-16 DIAGNOSIS — L909 Atrophic disorder of skin, unspecified: Secondary | ICD-10-CM | POA: Insufficient documentation

## 2013-07-16 DIAGNOSIS — Z90710 Acquired absence of both cervix and uterus: Secondary | ICD-10-CM | POA: Insufficient documentation

## 2013-07-16 DIAGNOSIS — Z124 Encounter for screening for malignant neoplasm of cervix: Secondary | ICD-10-CM | POA: Insufficient documentation

## 2013-07-16 DIAGNOSIS — N3941 Urge incontinence: Secondary | ICD-10-CM | POA: Insufficient documentation

## 2013-07-16 DIAGNOSIS — Z923 Personal history of irradiation: Secondary | ICD-10-CM | POA: Insufficient documentation

## 2013-07-16 DIAGNOSIS — Z9079 Acquired absence of other genital organ(s): Secondary | ICD-10-CM | POA: Insufficient documentation

## 2013-07-16 DIAGNOSIS — E785 Hyperlipidemia, unspecified: Secondary | ICD-10-CM | POA: Insufficient documentation

## 2013-07-16 LAB — BUN AND CREATININE (CC13)
BUN: 17.3 mg/dL (ref 7.0–26.0)
Creatinine: 0.7 mg/dL (ref 0.6–1.1)

## 2013-07-16 NOTE — Progress Notes (Signed)
Consult Note: Gyn-Onc  [name] 72 y.o. female  CC:  Chief Complaint  Patient presents with  . Endometrial cancer    Follow up    HPI The patient returns for her scheduled followup. She completed radiation therapy to the periaortic chain in July of 2013. She apparently tolerated radiation well with no significant GI symptoms. Overall her functional status is excellent. She has no GI GU or pelvic symptoms.   Patient was initially diagnosed endometrial cancer in June 2009. She underwent a TAH BSO. 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 (Stage IA grade 1). Patient was followed until a nodule in the upper vagina was noted and confirmed by biopsy in September 2012 to be recurrent disease. As further staging the patient had a PET scan in October 2012 which showed hypermetabolic pelvic lymph nodes. The right external iliac node measuring 1.9 cm and a left posterior obturator node measured 0.5 cm. The patient was then treated with whole pelvis radiation therapy and vaginal brachytherapy. Followup PET scan showed resolution of the pelvic adenopathy, however, a new periaortic lymph node was identified. Subsequently the periaortic chain was radiated. She tolerated this therapy well. She was last seen by Dr. Stanford Breed November 2013. She had PET scan in November that was negative for recurrent disease. She was seen by Dr. Roselind Messier in July of 2014. At that time her exam and Pap smear were both negative. She comes in today for another followup.   She states that she will be due for colonoscopy next year as her last one was in 2010. She is up-to-date on her mammograms.   Interval History:  She is overall doing fairly well. She does complain of some pelvic pressure which she describes as "burning". However, she denies any burning when she urinates. She does have some rare urge incontinence when she admittedly waits too long to go to the  restroom. She does have loose stools and complaints of bowel leakage. She admits to not eating enough fiber to bulk up her stools. She does wear a pad and she does get some chafing from this. She occasionally has some shooting pains in her abdomen. She denies any shortness of breath except with strenuous activities. She has reflux but no cardiac associated chest pain. She denies any vaginal bleeding. She is up-to-date on her mammograms.  Review of Systems: Constitutional:  Denies fever. Skin: No rash, sores, jaundice, itching, or dryness. + skin tag right groin Cardiovascular: No chest pain, shortness of breath, or edema  Pulmonary: No cough or wheeze.  Gastro Intestinal: Reporting intermittent lower abdominal soreness as above.  No nausea, vomiting, constipation, or diarrhea reported. + loose stools but no diarrhea. No bright red blood per rectum or change in bowel movement.  Genitourinary: No frequency, urgency, or dysuria.  Denies vaginal bleeding and discharge.  Musculoskeletal: No myalgia, arthralgia, joint swelling or pain.  Neurologic: No weakness, numbness, or change in gait.  Psychology: No changes   Current Meds:  Outpatient Encounter Prescriptions as of 07/16/2013  Medication Sig  . ACTONEL 35 MG tablet every 7 (seven) days.   Marland Kitchen aluminum-magnesium hydroxide-simethicone (MAALOX) 200-200-20 MG/5ML SUSP Take 15 mLs by mouth as needed.    . Calcium Carbonate (CALTRATE 600 PO) Take 1 tablet by mouth 3 (three) times daily.   . calcium carbonate (TUMS - DOSED IN MG ELEMENTAL CALCIUM) 500 MG chewable tablet Chew 1 tablet by mouth as needed.    . cholecalciferol (VITAMIN D) 1000  UNITS tablet Take 1,000 Units by mouth daily.    Marland Kitchen esomeprazole (NEXIUM) 20 MG capsule Take 20 mg by mouth as needed.  . multivitamin (THERAGRAN) per tablet Take 1 tablet by mouth daily. 50+ advantage   . oxyCODONE-acetaminophen (PERCOCET) 5-325 MG per tablet Take 1 tablet by mouth every 6 (six) hours as needed.    . propranolol ER (INDERAL LA) 80 MG 24 hr capsule   . rosuvastatin (CRESTOR) 20 MG tablet Take 20 mg by mouth daily.    . vitamin C (ASCORBIC ACID) 500 MG tablet Take 500 mg by mouth daily.    Marland Kitchen zinc gluconate 50 MG tablet Take 50 mg by mouth daily.      Allergy: No Known Allergies  Social Hx:   History   Social History  . Marital Status: Single    Spouse Name: N/A    Number of Children: N/A  . Years of Education: N/A   Occupational History  . Not on file.   Social History Main Topics  . Smoking status: Never Smoker   . Smokeless tobacco: Never Used  . Alcohol Use: No  . Drug Use: Not on file  . Sexual Activity: Not on file   Other Topics Concern  . Not on file   Social History Narrative  . No narrative on file    Past Surgical Hx:  Past Surgical History  Procedure Laterality Date  . Total abdominal hysterectomy w/ bilateral salpingoophorectomy  2009  . Cholecystectomy      Past Medical Hx:  Past Medical History  Diagnosis Date  . Migraines   . Hyperlipidemia   . GERD (gastroesophageal reflux disease)   . Arthritis   . Radiation 10/2011    External Beam/Intracavitary  . S/P radiation therapy 01/29/12 - 03/10/12    Periaortic area/ 5600 cgy/ 28 Fractions  . Endometrial cancer dx'd 01/2008    recurrences 08/2011; 01/2012; xrt comp 03/2012    Oncology Hx:    Endometrial cancer    Initial Diagnosis Endometrial cancer   02/14/2008 Surgery TAH/BSO IA grade 1   05/17/2011 Relapse/Recurrence + vaginal recurrence, + Pelvic node recurrence    - 07/17/2011 Radiation Therapy Pelvic and  vaginal brachytherapy completed    Relapse/Recurrence PA node recurrence    - 03/15/2012 Radiation Therapy     Family Hx:  Family History  Problem Relation Age of Onset  . Heart failure Mother   . Ulcers Father     Vitals:  Blood pressure 135/62, pulse 60, temperature 97.8 F (36.6 C), temperature source Oral, resp. rate 16, height 5\' 4"  (1.626 m), weight 180 lb 14.4 oz (82.056  kg).  Physical Exam:  Well-nourished well-developed female in no acute distress.  Neck: Supple, no lymphadenopathy no thyromegaly.  Lungs: Clear to auscultation bilaterally.  Cardiovascular: Regular rate and rhythm.  Abdomen: Well-healed vertical midline incision. Abdomen is protuberant. There's no fluid wave there is no obvious masses. There is some voluntary guarding. There is no appreciable hernia.  Groins: No lymphadenopathy. 4 mm skin tag right groin.  Extremities: No edema.  Pelvic: Normal external female genitalia. Positive cystocele. The vaginal cuff is without lesions. ThinPrep Pap was submitted without difficulty. Bimanual examination reveals no masses or nodularity. Rectal confirms.  Assessment/Plan:  72 year old with history of a stage IA grade 1 endometrial carcinoma who suffered a recurrence of the vaginal cuff is well as para-aotic lymph nodes. By exam today she has no evidence of recurrent disease. Her last CT scan was in April of  this year we'll schedule for one in the next coming weeks. She'll need a BUN and creatinine prior to her CT scan that'll be drawn today. We will notify her of the results of her Pap smear as was her CAT scan.  I believe her loose stools could be improved by bulking agents. She was given information regarding high-fiber diet. She will try this. If that does not improve she is to contact her primary care physician regarding this issue. She will also schedule colonoscopy for 2015.  The skin tag on her right groin bothers her. If she wishes to have that removed we'll be happy to do that for her. She will followup with radiation oncology in 4 months and return to see GYN oncology and 8 months. Willona Phariss A., MD 07/16/2013, 9:26 AM

## 2013-07-16 NOTE — Patient Instructions (Addendum)
Followup with radiation oncology in 4 months and return to see GYN oncology and 8 months. We'll contact you with the results of your CT scan and Pap smear.  High-Fiber Diet Fiber is found in fruits, vegetables, and grains. A high-fiber diet encourages the addition of more whole grains, legumes, fruits, and vegetables in your diet. The recommended amount of fiber for adult males is 38 g per day. For adult females, it is 25 g per day. Pregnant and lactating women should get 28 g of fiber per day. If you have a digestive or bowel problem, ask your caregiver for advice before adding high-fiber foods to your diet. Eat a variety of high-fiber foods instead of only a select few type of foods.  PURPOSE  To increase stool bulk.  To make bowel movements more regular to prevent constipation.  To lower cholesterol.  To prevent overeating. WHEN IS THIS DIET USED?  It may be used if you have constipation and hemorrhoids.  It may be used if you have uncomplicated diverticulosis (intestine condition) and irritable bowel syndrome.  It may be used if you need help with weight management.  It may be used if you want to add it to your diet as a protective measure against atherosclerosis, diabetes, and cancer. SOURCES OF FIBER  Whole-grain breads and cereals.  Fruits, such as apples, oranges, bananas, berries, prunes, and pears.  Vegetables, such as green peas, carrots, sweet potatoes, beets, broccoli, cabbage, spinach, and artichokes.  Legumes, such split peas, soy, lentils.  Almonds. FIBER CONTENT IN FOODS Starches and Grains / Dietary Fiber (g)  Cheerios, 1 cup / 3 g  Corn Flakes cereal, 1 cup / 0.7 g  Rice crispy treat cereal, 1 cup / 0.3 g  Instant oatmeal (cooked),  cup / 2 g  Frosted wheat cereal, 1 cup / 5.1 g  Brown, long-grain rice (cooked), 1 cup / 3.5 g  White, long-grain rice (cooked), 1 cup / 0.6 g  Enriched macaroni (cooked), 1 cup / 2.5 g Legumes / Dietary Fiber  (g)  Baked beans (canned, plain, or vegetarian),  cup / 5.2 g  Kidney beans (canned),  cup / 6.8 g  Pinto beans (cooked),  cup / 5.5 g Breads and Crackers / Dietary Fiber (g)  Plain or honey graham crackers, 2 squares / 0.7 g  Saltine crackers, 3 squares / 0.3 g  Plain, salted pretzels, 10 pieces / 1.8 g  Whole-wheat bread, 1 slice / 1.9 g  White bread, 1 slice / 0.7 g  Raisin bread, 1 slice / 1.2 g  Plain bagel, 3 oz / 2 g  Flour tortilla, 1 oz / 0.9 g  Corn tortilla, 1 small / 1.5 g  Hamburger or hotdog bun, 1 small / 0.9 g Fruits / Dietary Fiber (g)  Apple with skin, 1 medium / 4.4 g  Sweetened applesauce,  cup / 1.5 g  Banana,  medium / 1.5 g  Grapes, 10 grapes / 0.4 g  Orange, 1 small / 2.3 g  Raisin, 1.5 oz / 1.6 g  Melon, 1 cup / 1.4 g Vegetables / Dietary Fiber (g)  Green beans (canned),  cup / 1.3 g  Carrots (cooked),  cup / 2.3 g  Broccoli (cooked),  cup / 2.8 g  Peas (cooked),  cup / 4.4 g  Mashed potatoes,  cup / 1.6 g  Lettuce, 1 cup / 0.5 g  Corn (canned),  cup / 1.6 g  Tomato,  cup / 1.1 g Document  Released: 08/20/2005 Document Revised: 02/19/2012 Document Reviewed: 11/22/2011 [facility] Patient Information 2014 [place], [place].   Fiber Content in Foods Drinking plenty of fluids and consuming foods high in fiber can help with constipation. See the list below for the fiber content of some common foods. Starches and Grains / Dietary Fiber (g)  Cheerios, 1 cup / 3 g  Kellogg[employer] Corn Flakes, 1 cup / 0.7 g  Rice Krispies, 1  cup / 0.3 g  Quaker Oat Life Cereal,  cup / 2.1 g  Oatmeal, instant (cooked),  cup / 2 g  Kellogg's Frosted Mini Wheats, 1 cup / 5.1 g  Rice, brown, long-grain (cooked), 1 cup / 3.5 g  Rice, white, long-grain (cooked), 1 cup / 0.6 g  Macaroni, cooked, enriched, 1 cup / 2.5 g Legumes / Dietary Fiber (g)  Beans, baked, canned, plain or vegetarian,  cup / 5.2 g  Beans, kidney, canned,   cup / 6.8 g  Beans, pinto, dried (cooked),  cup / 7.7 g  Beans, pinto, canned,  cup / 5.5 g Breads and Crackers / Dietary Fiber (g)  Graham crackers, plain or honey, 2 squares / 0.7 g  Saltine crackers, 3 squares / 0.3 g  Pretzels, plain, salted, 10 pieces / 1.8 g  Bread, whole-wheat, 1 slice / 1.9 g  Bread, white, 1 slice / 0.7 g  Bread, raisin, 1 slice / 1.2 g  Bagel, plain, 3 oz / 2 g  Tortilla, flour, 1 oz / 0.9 g  Tortilla, corn, 1 small / 1.5 g  Bun, hamburger or hotdog, 1 small / 0.9 g Fruits / Dietary Fiber (g)  Apple, raw with skin, 1 medium / 4.4 g  Applesauce, sweetened,  cup / 1.5 g  Banana,  medium / 1.5 g  Grapes, 10 grapes / 0.4 g  Orange, 1 small / 2.3 g  Raisin, 1.5 oz / 1.6 g  Melon, 1 cup / 1.4 g Vegetables / Dietary Fiber (g)  Green beans, canned,  cup / 1.3 g  Carrots (cooked),  cup / 2.3 g  Broccoli (cooked),  cup / 2.8 g  Peas, frozen (cooked),  cup / 4.4 g  Potatoes, mashed,  cup / 1.6 g  Lettuce, 1 cup / 0.5 g  Corn, canned,  cup / 1.6 g  Tomato,  cup / 1.1 g Document Released: 01/06/2007 Document Revised: 11/12/2011 Document Reviewed: 03/03/2007 [employer] Patient Information 2014 [place], [place].

## 2013-07-22 ENCOUNTER — Ambulatory Visit (HOSPITAL_COMMUNITY)
Admission: RE | Admit: 2013-07-22 | Discharge: 2013-07-22 | Disposition: A | Discharge status: Home / Self Care | Payer: Medicare Other | Source: Ambulatory Visit | Attending: Gynecologic Oncology | Admitting: Gynecologic Oncology

## 2013-07-22 ENCOUNTER — Encounter (HOSPITAL_COMMUNITY): Payer: Self-pay

## 2013-07-22 DIAGNOSIS — Z90710 Acquired absence of both cervix and uterus: Secondary | ICD-10-CM | POA: Insufficient documentation

## 2013-07-22 DIAGNOSIS — C541 Malignant neoplasm of endometrium: Secondary | ICD-10-CM

## 2013-07-22 DIAGNOSIS — I700 Atherosclerosis of aorta: Secondary | ICD-10-CM | POA: Insufficient documentation

## 2013-07-22 DIAGNOSIS — C549 Malignant neoplasm of corpus uteri, unspecified: Secondary | ICD-10-CM | POA: Insufficient documentation

## 2013-07-22 DIAGNOSIS — M533 Sacrococcygeal disorders, not elsewhere classified: Secondary | ICD-10-CM | POA: Insufficient documentation

## 2013-07-22 DIAGNOSIS — K8689 Other specified diseases of pancreas: Secondary | ICD-10-CM | POA: Insufficient documentation

## 2013-07-22 DIAGNOSIS — Z9089 Acquired absence of other organs: Secondary | ICD-10-CM | POA: Insufficient documentation

## 2013-07-22 DIAGNOSIS — Z923 Personal history of irradiation: Secondary | ICD-10-CM | POA: Insufficient documentation

## 2013-07-22 MED ORDER — IOHEXOL 300 MG/ML  SOLN
100.0000 mL | Freq: Once | INTRAMUSCULAR | Status: AC | PRN
  Administered 2013-07-22: 100 mL via INTRAVENOUS

## 2013-07-23 ENCOUNTER — Telehealth: Payer: Self-pay | Admitting: Gynecologic Oncology

## 2013-07-23 NOTE — Telephone Encounter (Signed)
Patient notified of pap smear results and CT scan results.  No concerns voiced.  Instructed to call for any needs or concerns.

## 2013-09-07 ENCOUNTER — Ambulatory Visit: Payer: Medicare Other | Admitting: Radiation Oncology

## 2013-10-26 ENCOUNTER — Ambulatory Visit
Admission: RE | Admit: 2013-10-26 | Discharge: 2013-10-26 | Disposition: A | Discharge status: Home / Self Care | Payer: Medicare Other | Source: Ambulatory Visit | Attending: Radiation Oncology | Admitting: Radiation Oncology

## 2013-10-26 ENCOUNTER — Encounter: Payer: Self-pay | Admitting: Radiation Oncology

## 2013-10-26 ENCOUNTER — Other Ambulatory Visit (HOSPITAL_COMMUNITY)
Admission: RE | Admit: 2013-10-26 | Discharge: 2013-10-26 | Disposition: A | Discharge status: Home / Self Care | Payer: Medicare Other | Source: Ambulatory Visit | Attending: Radiation Oncology | Admitting: Radiation Oncology

## 2013-10-26 VITALS — BP 112/64 | HR 51 | Temp 97.6°F | Ht 64.0 in | Wt 182.3 lb

## 2013-10-26 DIAGNOSIS — Z1151 Encounter for screening for human papillomavirus (HPV): Secondary | ICD-10-CM | POA: Insufficient documentation

## 2013-10-26 DIAGNOSIS — C541 Malignant neoplasm of endometrium: Secondary | ICD-10-CM

## 2013-10-26 DIAGNOSIS — R87619 Unspecified abnormal cytological findings in specimens from cervix uteri: Secondary | ICD-10-CM | POA: Insufficient documentation

## 2013-10-26 DIAGNOSIS — Z124 Encounter for screening for malignant neoplasm of cervix: Secondary | ICD-10-CM | POA: Insufficient documentation

## 2013-10-26 NOTE — Progress Notes (Signed)
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  Other than some upper respiratory symptoms she is doing well. She denies any flank or back pain. She denies any vaginal bleeding or discharge. She denies any hematuria or rectal bleeding. Patient occasionally will have occasional episodes of stool leakage for which she wears a pad.                             ALLERGIES:  has No Known Allergies.  Meds: Current Outpatient Prescriptions  Medication Sig Dispense Refill  . ACTONEL 35 MG tablet every 7 (seven) days.       Marland Kitchen alum hydroxide-mag trisilicate (GAVISCON) 40-98 MG CHEW chewable tablet Chew 1 tablet by mouth.      Marland Kitchen aluminum-magnesium hydroxide-simethicone (MAALOX) 119-147-82 MG/5ML SUSP Take 15 mLs by mouth as needed.        . Calcium Carbonate (CALTRATE 600 PO) Take 1 tablet by mouth 3 (three) times daily.       . calcium carbonate (TUMS - DOSED IN MG ELEMENTAL CALCIUM) 500 MG chewable tablet Chew 1 tablet by mouth as needed.        . cholecalciferol (VITAMIN D) 1000 UNITS tablet Take 1,000 Units by mouth daily.        Marland Kitchen esomeprazole (NEXIUM) 20 MG capsule Take 20 mg by mouth as needed.      . multivitamin (THERAGRAN) per tablet Take 1 tablet by mouth daily. 50+ advantage       . propranolol ER (INDERAL LA) 80 MG 24 hr capsule       . rosuvastatin (CRESTOR) 20 MG tablet Take 20 mg by mouth daily.        . vitamin C (ASCORBIC ACID) 500 MG tablet Take 500 mg by mouth daily.        . vitamin  E (VITAMIN E) 400 UNIT capsule Take 400 Units by mouth daily.      Marland Kitchen zinc gluconate 50 MG tablet Take 50 mg by mouth daily.        Marland Kitchen oxyCODONE-acetaminophen (PERCOCET) 5-325 MG per tablet Take 1 tablet by mouth every 6 (six) hours as needed.        No current facility-administered medications for this encounter.    Physical Findings: The patient is in no acute distress. Patient is alert and oriented.  height is 5\' 4"  (1.626 m) and weight is 182 lb 4.8 oz (82.691 kg). Her temperature is 97.6 F (36.4 C). Her blood pressure is 112/64 and her pulse is 51. Marland Kitchen  No palpable supraclavicular or axillary adenopathy. The lungs are clear to auscultation. The heart has regular rhythm and rate. The abdomen is soft and nontender with normal bowel sounds. The inguinal areas are free of adenopathy. On pelvic examination external genitalia are unremarkable. A speculum exam is performed. There some radiation changes noted in the proximal vagina but no mucosal lesions are noted. A Pap smear was obtained of the proximal vagina. On bimanual and  rectovaginal examination there no pelvic masses appreciated. Sphincter tone is slightly decreased.  Lab Findings: Lab Results  Component Value Date   WBC 6.8 06/08/2011   HGB 13.6 06/08/2011   HCT 40.7 06/08/2011   MCV 96.0 06/08/2011   PLT 225 06/08/2011      Radiographic Findings: CT scan of the chest abdomen and pelvis last fall showing no evidence of residual disease or recurrent  Impression:  No evidence of recurrence on physical exam today or recent imaging studies, Pap smear pending  Plan:  Routine followup in 6 months. In the interim the patient will be seen by gynecologic oncology.  ____________________________________ Blair Promise, MD

## 2013-10-26 NOTE — Addendum Note (Signed)
Encounter addended by: Blair Promise, MD on: 10/26/2013  3:01 PM<BR>     Documentation filed: Orders

## 2013-10-26 NOTE — Progress Notes (Signed)
[name] is here for follow up after treatment to her periaortic area.  She denies pain but has a sore throat and feels like she is getting a cold.  She denies vaginal bleeding/discharge, bladder issues and diarrhea.  She reports having loose stools since she had her gallbladder out years ago.  She is using her dilator usually about once a week.

## 2013-11-09 ENCOUNTER — Telehealth: Payer: Self-pay | Admitting: Gynecologic Oncology

## 2013-11-09 NOTE — Telephone Encounter (Signed)
Message left about pap smear.  Advised to follow up with Dr. Alycia Rossetti as scheduled in July.

## 2013-11-29 IMAGING — CT CT ABD-PELV W/ CM
2 of 5 series · 17 of 46 positions shown, 19 images · IV contrast (OMNIPAQUE)
Comparison: PET CT scan 07/22/2012

CT CHEST

CLINICAL DATA: Endometrial carcinoma

CT CHEST, ABDOMEN AND PELVIS WITH CONTRAST
TECHNIQUE: Multidetector CT imaging of the chest, abdomen and
pelvis was performed following the standard protocol during bolus
administration of intravenous contrast.
Contrast: 100mL OMNIPAQUE IOHEXOL 300 MG/ML  SOLN

[Series 2: cap with st · axial · 0.69mm/px · z∈[-596,-56]mm · 14 of 122 slices shown, 16 images]
[im 7/122  soft-tissue]
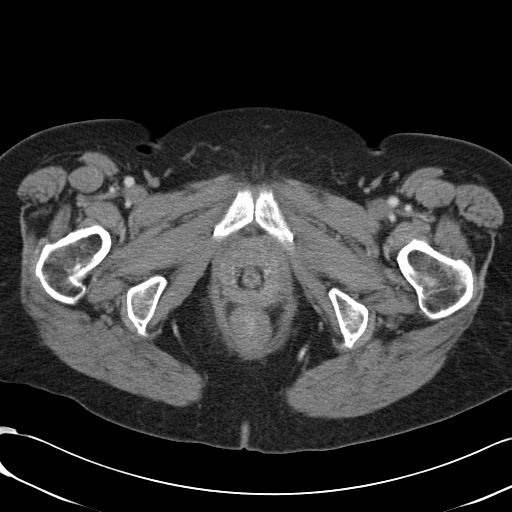
[im 7/122  bone]
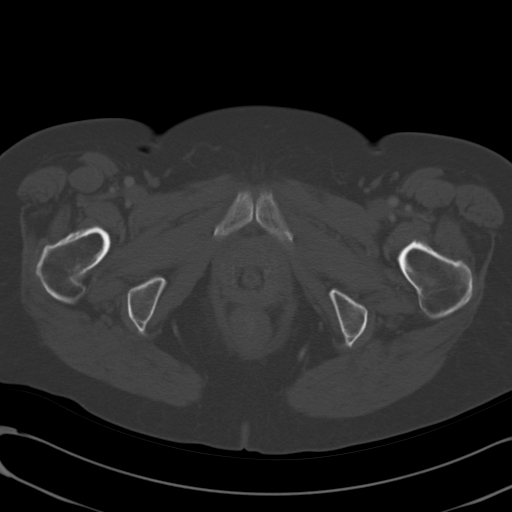
[im 13/122  soft-tissue]
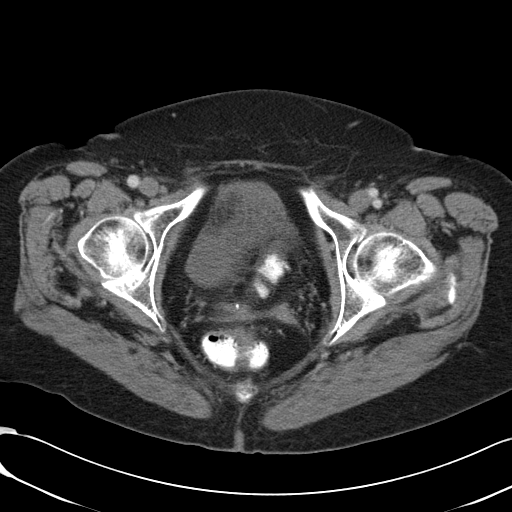
[im 26/122  soft-tissue]
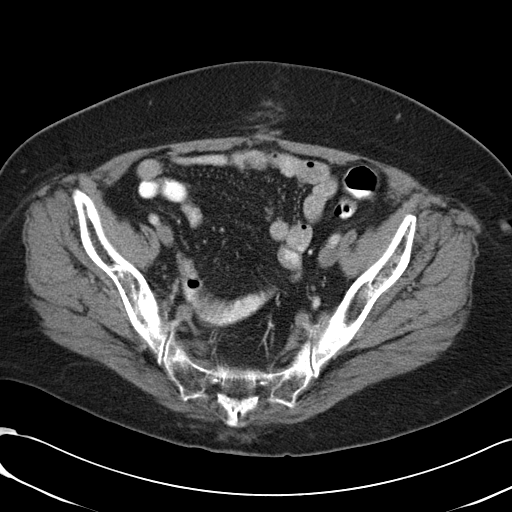
[im 32/122  soft-tissue]
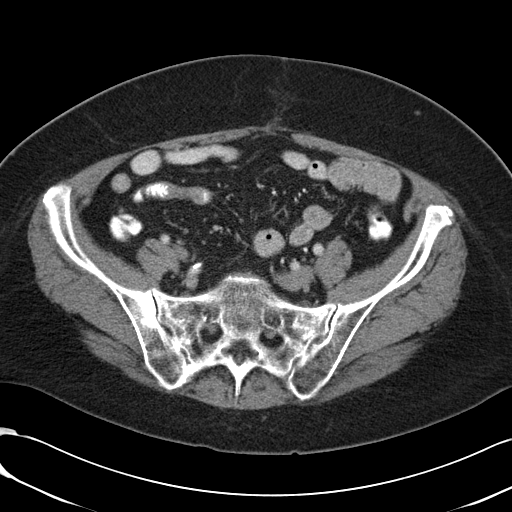
[im 39/122  soft-tissue]
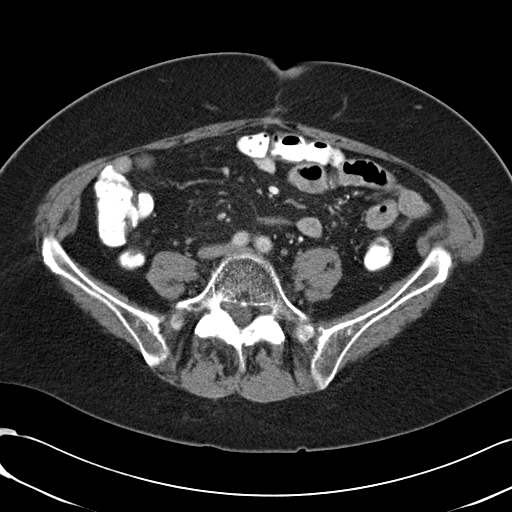
[im 51/122  soft-tissue]
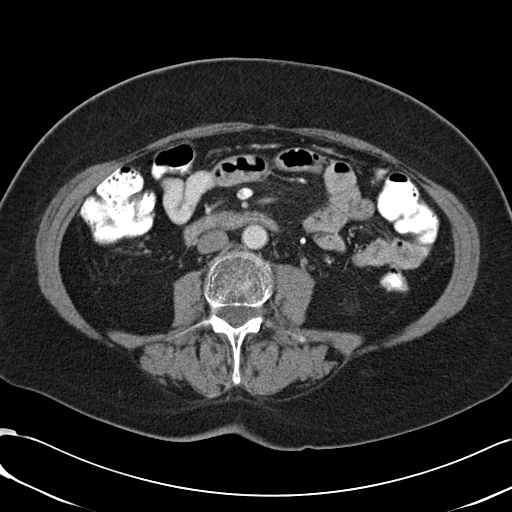
[im 58/122  soft-tissue]
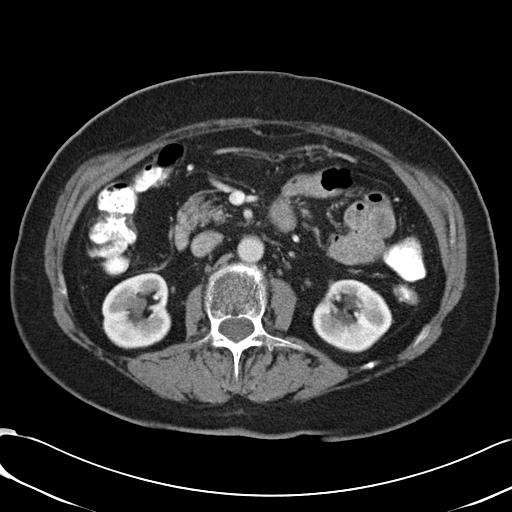
[im 64/122  soft-tissue]
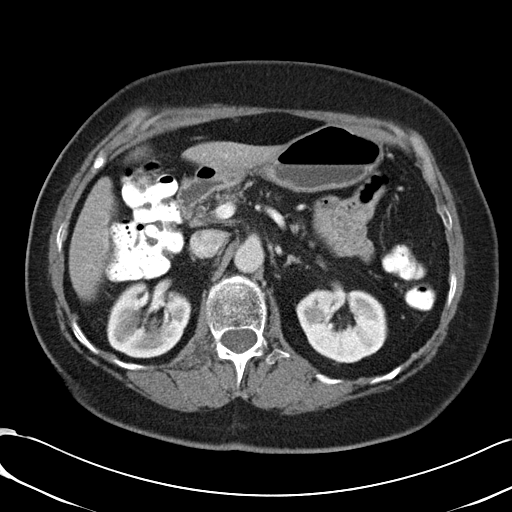
[im 71/122  soft-tissue]
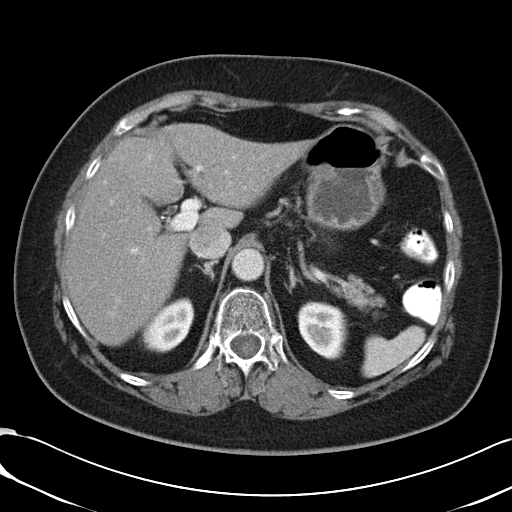
[im 71/122  bone]
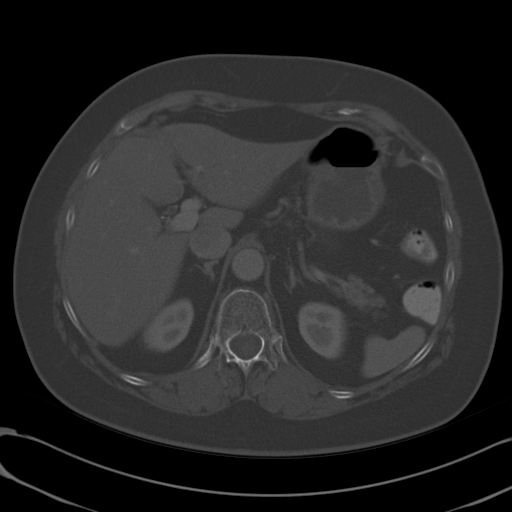
[im 83/122  soft-tissue]
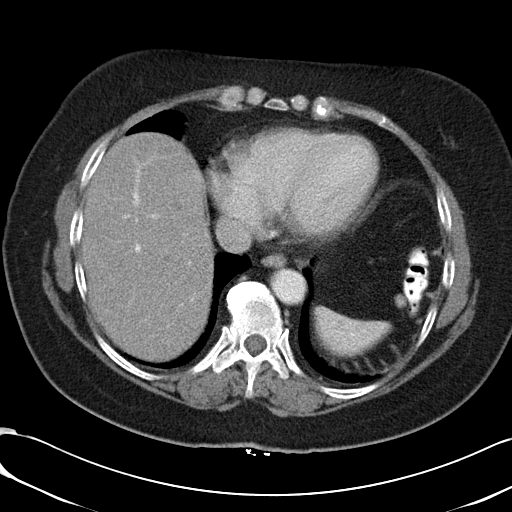
[im 90/122  soft-tissue]
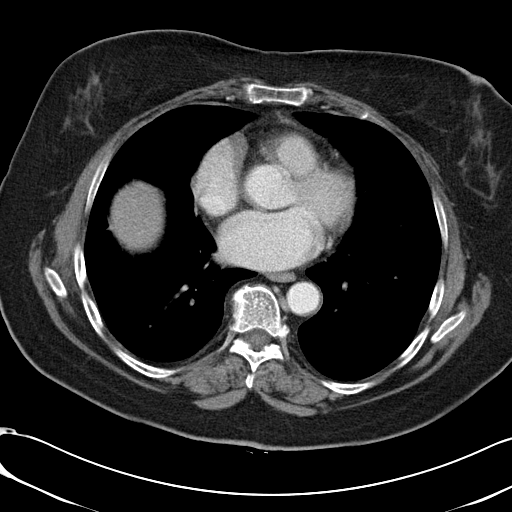
[im 96/122  soft-tissue]
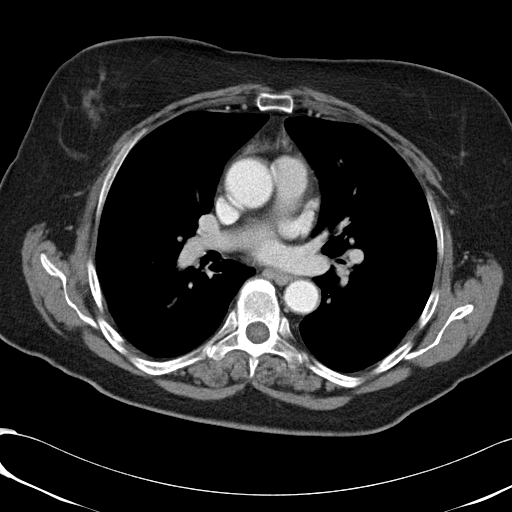
[im 109/122  soft-tissue]
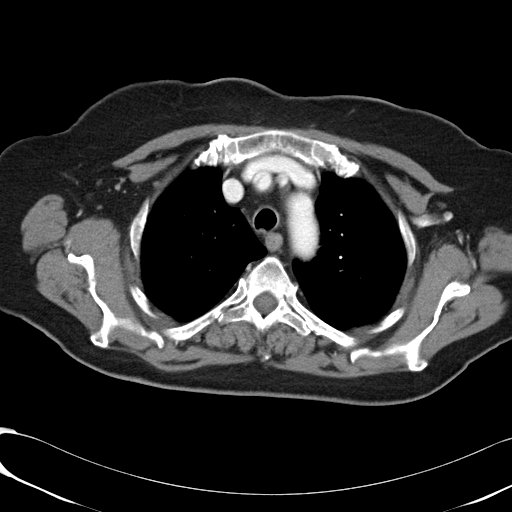
[im 115/122  soft-tissue]
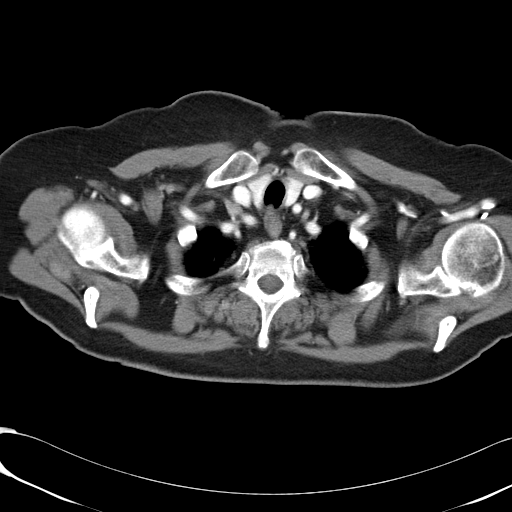

[Series 602: coronal images · coronal · 1.19mm/px · 3 of 92 slices shown]
[im 31/92  soft-tissue]
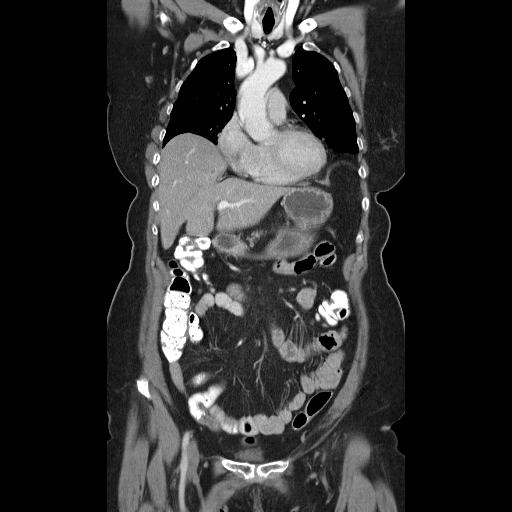
[im 41/92  soft-tissue]
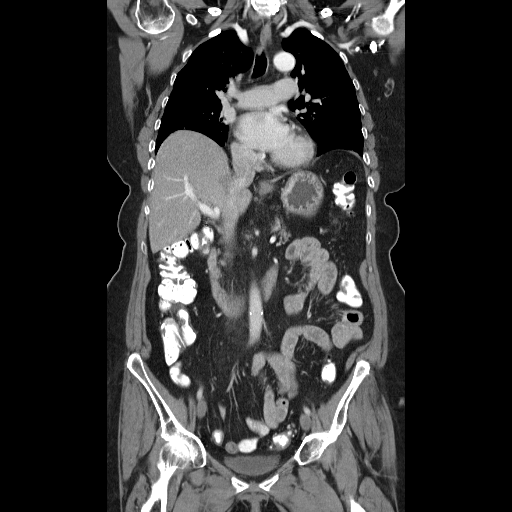
[im 51/92  soft-tissue]
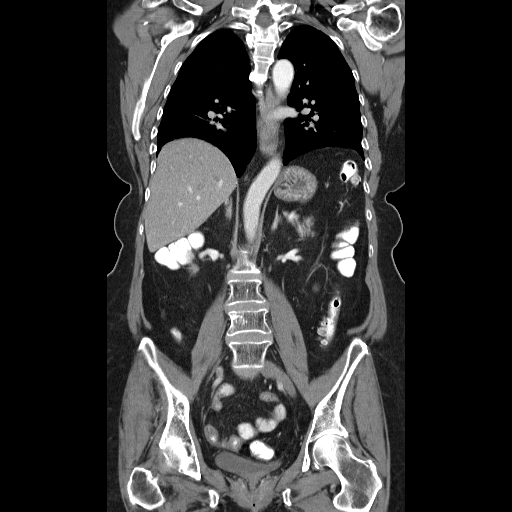

[17 of 46 positions shown; findings below may reference images not displayed]

FINDINGS: No axillary or supraclavicular lymphadenopathy.  No
mediastinal or hilar lymphadenopathy.  No pericardial fluid.
Review of the lung parenchyma demonstrates no suspicious pulmonary
nodules.
IMPRESSION: No evidence of thoracic metastasis.

CT ABDOMEN AND PELVIS
FINDINGS: No focal hepatic lesion.  Post cholecystectomy.  The
pancreas, spleen, adrenal glands, and kidneys are normal.

The stomach, small bowel, appendix, and cecum are normal.  The
colon and  rectosigmoid colon are normal.

Abdominal aorta is normal caliber.  No retroperitoneal periportal
lymphadenopathy.

There is no free fluid the pelvis.  Post hysterectomy anatomy.  The
bladder is normal.  No nodularity along the vaginal cuff.  No
pelvic lymphadenopathy.

There is again demonstrated sclerosis within the sacrum not changed
from prior.
IMPRESSION: 1.  No evidence of local endometrial cancer recurrence within the
pelvis.
2.  No evidence of metastatic adenopathy in the abdomen or pelvis.
3.  Stable sclerotic change within the sacrum

## 2014-02-18 ENCOUNTER — Encounter: Payer: Self-pay | Admitting: Gynecologic Oncology

## 2014-02-18 ENCOUNTER — Ambulatory Visit: Payer: Medicare Other | Attending: Gynecologic Oncology | Admitting: Gynecologic Oncology

## 2014-02-18 ENCOUNTER — Other Ambulatory Visit (HOSPITAL_COMMUNITY)
Admission: RE | Admit: 2014-02-18 | Discharge: 2014-02-18 | Disposition: A | Discharge status: Home / Self Care | Payer: Medicare Other | Source: Ambulatory Visit | Attending: Gynecologic Oncology | Admitting: Gynecologic Oncology

## 2014-02-18 VITALS — BP 126/60 | HR 65 | Temp 98.3°F | Resp 18 | Ht 64.0 in | Wt 185.6 lb

## 2014-02-18 DIAGNOSIS — E785 Hyperlipidemia, unspecified: Secondary | ICD-10-CM | POA: Insufficient documentation

## 2014-02-18 DIAGNOSIS — C549 Malignant neoplasm of corpus uteri, unspecified: Secondary | ICD-10-CM | POA: Insufficient documentation

## 2014-02-18 DIAGNOSIS — Z8542 Personal history of malignant neoplasm of other parts of uterus: Secondary | ICD-10-CM

## 2014-02-18 DIAGNOSIS — N8111 Cystocele, midline: Secondary | ICD-10-CM

## 2014-02-18 DIAGNOSIS — Z124 Encounter for screening for malignant neoplasm of cervix: Secondary | ICD-10-CM | POA: Insufficient documentation

## 2014-02-18 DIAGNOSIS — C541 Malignant neoplasm of endometrium: Secondary | ICD-10-CM

## 2014-02-18 DIAGNOSIS — Z90710 Acquired absence of both cervix and uterus: Secondary | ICD-10-CM | POA: Insufficient documentation

## 2014-02-18 DIAGNOSIS — Z923 Personal history of irradiation: Secondary | ICD-10-CM

## 2014-02-18 DIAGNOSIS — K219 Gastro-esophageal reflux disease without esophagitis: Secondary | ICD-10-CM | POA: Insufficient documentation

## 2014-02-18 DIAGNOSIS — Z79899 Other long term (current) drug therapy: Secondary | ICD-10-CM | POA: Insufficient documentation

## 2014-02-18 DIAGNOSIS — R32 Unspecified urinary incontinence: Secondary | ICD-10-CM

## 2014-02-18 NOTE — Progress Notes (Signed)
Consult Note: Gyn-Onc  [name] 73 y.o. female  CC:  Chief Complaint  Patient presents with  . Endometrial Cancer    Follow up    HPI The patient returns for her scheduled followup. She completed radiation therapy to the periaortic chain in July of 2013. She apparently tolerated radiation well with no significant GI symptoms. Overall her functional status is excellent. She has no GI GU or pelvic symptoms.   Patient was initially diagnosed endometrial cancer in June 2009. She underwent a TAH BSO. 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 (Stage IA grade 1). Patient was followed until a nodule in the upper vagina was noted and confirmed by biopsy in September 2012 to be recurrent disease. As further staging the patient had a PET scan in October 2012 which showed hypermetabolic pelvic lymph nodes. The right external iliac node measuring 1.9 cm and a left posterior obturator node measured 0.5 cm. The patient was then treated with whole pelvis radiation therapy and vaginal brachytherapy. Followup PET scan showed resolution of the pelvic adenopathy, however, a new periaortic lymph node was identified. Subsequently the periaortic chain was radiated. She tolerated this therapy well. She was last seen by Dr. Fermin Schwab November 2013. She had PET scan in November that was negative for recurrent disease. She was seen by Dr. Sondra Come in February of 2015. At that time her exam was negative. A Pap smear revealed atypical squamous cells of undetermined significance. She comes in today for another followup. I last saw her November 2014. CT scan at that time revealed:  FINDINGS:  CT CHEST FINDINGS  There are no enlarged mediastinal, hilar or axillary lymph nodes. There is stable mild aortic atherosclerosis. The heart size is normal. There is no pleural or pericardial effusion. The lungs are clear. There are no worrisome osseous findings.  CT  ABDOMEN AND PELVIS FINDINGS  Ill-defined low-density centrally in the liver superior to the cholecystectomy bed is slightly more prominent (images 48-50) but most likely indicates focal fat. The liver otherwise appears unremarkable. There is no biliary dilatation status post cholecystectomy. Diffuse pancreatic atrophy appears unchanged. The spleen and adrenal glands appear normal. 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 There is no hydronephrosis or suspicious renal finding. There is no evidence recurrent pelvic mass status post hysterectomy. There is no ascites or peritoneal nodularity. The urinary bladder appears normal. No significant abnormalities of the stomach, small bowel, colon or appendix are seen.  There is stable mixed sclerosis and lucency along both sides of the sacroiliac joints which is probably degenerative based on stability. No insufficiency fracture or focal lesion is identified.  IMPRESSION:  1. No evidence of local recurrence of endometrial cancer in the pelvis.  2. No evidence of metastatic disease to the chest, abdomen or pelvis.  3. Stable appearance of the sacrum.  4. Focal fat in the liver superior to the cholecystectomy bed.  She states that she will be due for colonoscopy next year as her last one was in 2010. She's is going to wait until the fall to do this as she's having cataract surgery in August. She is up-to-date on her mammograms.   Interval History:  She is overall doing fairly well.  She does have some rare urge incontinence when she admittedly waits too long to go to the restroom. She does have loose stools and complaints of bowel leakage though less than last time I saw her.  She admits to not eating enough fiber to bulk up her stools. She does wear a pad and she does get some chafing from this. She occasionally has some shooting pains in her abdomen. She denies any shortness of breath except with strenuous  activities. She has reflux but no cardiac associated chest pain. She denies any vaginal bleeding. She's gained approximately 5 pounds since we last saw her. She missed been quite sedentary. She had lost some weight in January but has gained it again. She's not been out working in her garden very much secondary to the heat and humidity.  Review of Systems: Constitutional:  Denies fever. Skin: No rash, sores, jaundice, itching, or dryness.  Cardiovascular: No chest pain, shortness of breath, or edema  Pulmonary: No cough or wheeze.  Gastro Intestinal:  No nausea, vomiting, constipation, or diarrhea reported. + loose stools but no diarrhea. No bright red blood per rectum or change in bowel movement.  Genitourinary: No frequency, urgency, or dysuria.  Denies vaginal bleeding and discharge.  Musculoskeletal: No myalgia, arthralgia, joint swelling or pain. + pain left foot and ankle after twisting it last week Neurologic: No weakness, numbness, or change in gait.  Psychology: No changes   Current Meds:  Outpatient Encounter Prescriptions as of 02/18/2014  Medication Sig  . ACTONEL 35 MG tablet every 7 (seven) days.   Marland Kitchen alum hydroxide-mag trisilicate (GAVISCON) 34-19 MG CHEW chewable tablet Chew 1 tablet by mouth.  Marland Kitchen aluminum-magnesium hydroxide-simethicone (MAALOX) 379-024-09 MG/5ML SUSP Take 15 mLs by mouth as needed.    . Calcium Carbonate (CALTRATE 600 PO) Take 1 tablet by mouth 3 (three) times daily.   . calcium carbonate (TUMS - DOSED IN MG ELEMENTAL CALCIUM) 500 MG chewable tablet Chew 1 tablet by mouth as needed.    . cholecalciferol (VITAMIN D) 1000 UNITS tablet Take 1,000 Units by mouth daily.    Marland Kitchen esomeprazole (NEXIUM) 20 MG capsule Take 20 mg by mouth as needed.  . multivitamin (THERAGRAN) per tablet Take 1 tablet by mouth daily. 50+ advantage   . oxyCODONE-acetaminophen (PERCOCET) 5-325 MG per tablet Take 1 tablet by mouth every 6 (six) hours as needed.   . propranolol ER (INDERAL LA)  80 MG 24 hr capsule   . rosuvastatin (CRESTOR) 20 MG tablet Take 20 mg by mouth daily.    . vitamin C (ASCORBIC ACID) 500 MG tablet Take 500 mg by mouth daily.    . vitamin E (VITAMIN E) 400 UNIT capsule Take 400 Units by mouth daily.  Marland Kitchen zinc gluconate 50 MG tablet Take 50 mg by mouth daily.      Allergy: No Known Allergies  Social Hx:   History   Social History  . Marital Status: Single    Spouse Name: N/A    Number of Children: N/A  . Years of Education: N/A   Occupational History  . Not on file.   Social History Main Topics  . Smoking status: Never Smoker   . Smokeless tobacco: Never Used  . Alcohol Use: No  . Drug Use: Not on file  . Sexual Activity: Not on file   Other Topics Concern  . Not on file   Social History Narrative  . No narrative on file    Past Surgical Hx:  Past Surgical History  Procedure Laterality Date  . Total abdominal hysterectomy w/ bilateral salpingoophorectomy  2009  . Cholecystectomy      Past Medical Hx:  Past Medical History  Diagnosis Date  . Migraines   .  Hyperlipidemia   . GERD (gastroesophageal reflux disease)   . Arthritis   . Radiation 10/2011    External Beam/Intracavitary  . S/P radiation therapy 01/29/12 - 03/10/12    Periaortic area/ 5600 cgy/ 28 Fractions  . Endometrial cancer dx'd 01/2008    recurrences 08/2011; 01/2012; xrt comp 03/2012    Oncology Hx:    Endometrial cancer    Initial Diagnosis Endometrial cancer   02/14/2008 Surgery TAH/BSO IA grade 1   05/17/2011 Relapse/Recurrence + vaginal recurrence, + Pelvic node recurrence    - 07/17/2011 Radiation Therapy Pelvic and  vaginal brachytherapy completed    Relapse/Recurrence PA node recurrence    - 03/15/2012 Radiation Therapy     Family Hx:  Family History  Problem Relation Age of Onset  . Heart failure Mother   . Ulcers Father     Vitals:  Blood pressure 126/60, pulse 65, temperature 98.3 F (36.8 C), temperature source Oral, resp. rate 18, height 5\' 4"   (1.626 m), weight 185 lb 9.6 oz (84.188 kg).  Physical Exam:  Well-nourished well-developed female in no acute distress.  Neck: Supple, no lymphadenopathy no thyromegaly.  Lungs: Clear to auscultation bilaterally.  Cardiovascular: Regular rate and rhythm.  Abdomen: Well-healed vertical midline incision. Abdomen is protuberant. There's no fluid wave there is no obvious masses. There is some voluntary guarding. There is no appreciable hernia.  Groins: No lymphadenopathy.  Extremities: No edema.  Pelvic: Normal external female genitalia. Positive cystocele. The vaginal cuff is without lesions. + cystocoele ThinPrep Pap was submitted without difficulty. Bimanual examination reveals no masses or nodularity. Rectal confirms. + rectocoele  Assessment/Plan:  73 year old with history of a stage IA grade 1 endometrial carcinoma who suffered a recurrence of the vaginal cuff is well as para-aotic lymph nodes. By exam today she has no evidence of recurrent disease. Her last CT scan was in Nov 2014. We will schedule a repeat for this fall. She will need to come in for a creatinine prior to that time. She will followup with radiation oncology in 4 months and we can do a CT then and return to see GYN oncology and 8 months. Her cystocele appears to be slightly worse than it was when I last saw her. She denies any increasing symptoms with regards to any incontinence. We'll continue to follow that expectantly at this time. GEHRIG,PAOLA A., MD 02/18/2014, 9:55 AM

## 2014-02-18 NOTE — Addendum Note (Signed)
Addended by: Lucile Crater on: 02/18/2014 10:17 AM   Modules accepted: Orders

## 2014-02-18 NOTE — Patient Instructions (Signed)
We will notify you of the results of your Pap smear. Please followup with Dr. Sondra Come in 4 months. We will schedule you to have a CT scan in the fall of this year. Please return to see GYN oncology and 8 months.

## 2014-02-23 LAB — CYTOLOGY - PAP

## 2014-02-24 ENCOUNTER — Telehealth: Payer: Self-pay | Admitting: *Deleted

## 2014-02-24 NOTE — Telephone Encounter (Signed)
Pt.notified

## 2014-02-24 NOTE — Telephone Encounter (Signed)
Message copied by Lucile Crater on Wed Feb 24, 2014  2:34 PM ------      Message from: Nancy Marus      Created: Tue Feb 23, 2014  5:37 PM       Please call her with her normal pap smear results.      PG      ----- Message -----         From: Lab in [place]: 02/23/2014   2:24 PM           To: Imagene Gurney A. Alycia Rossetti, MD                   ------

## 2014-04-26 ENCOUNTER — Ambulatory Visit: Payer: Medicare Other | Admitting: Radiation Oncology

## 2014-05-07 ENCOUNTER — Other Ambulatory Visit: Payer: Self-pay | Admitting: Gastroenterology

## 2014-06-10 ENCOUNTER — Other Ambulatory Visit (HOSPITAL_COMMUNITY)
Admission: RE | Admit: 2014-06-10 | Discharge: 2014-06-10 | Disposition: A | Discharge status: Home / Self Care | Payer: Medicare Other | Source: Ambulatory Visit | Attending: Radiation Oncology | Admitting: Radiation Oncology

## 2014-06-10 ENCOUNTER — Encounter: Payer: Self-pay | Admitting: Radiation Oncology

## 2014-06-10 ENCOUNTER — Ambulatory Visit
Admission: RE | Admit: 2014-06-10 | Discharge: 2014-06-10 | Disposition: A | Discharge status: Home / Self Care | Payer: Medicare Other | Source: Ambulatory Visit | Attending: Radiation Oncology | Admitting: Radiation Oncology

## 2014-06-10 VITALS — BP 127/56 | HR 52 | Temp 97.9°F | Resp 20 | Wt 186.1 lb

## 2014-06-10 DIAGNOSIS — C541 Malignant neoplasm of endometrium: Secondary | ICD-10-CM

## 2014-06-10 DIAGNOSIS — Z01411 Encounter for gynecological examination (general) (routine) with abnormal findings: Secondary | ICD-10-CM | POA: Insufficient documentation

## 2014-06-10 NOTE — Progress Notes (Signed)
Radiation Oncology         [phone] ________________________________  Name: [name] MRN: [id number]  Date: 06/10/2014  DOB: 05/04/1941  Follow-Up Visit Note  CC: Julia Patricia, MD  [name] *  Diagnosis:   Recurrent endometrial cancer with an isolated recurrence in the periaortic area   Interval Since Last Radiation:  2 years and 3 months for periaortic treatment. Patient has also receive treatments to the pelvis including IM RT and brachytherapy in the past  Narrative:  The patient returns today for routine follow-up.  She seems to be doing well at this time. She has occasional urinary incontinence but no worse since her last evaluation. Patient denies any rectal bleeding vaginal bleeding or hematuria. She occasionally will have loose bowel movements but no problems with diarrhea. She does complain of poor sleep. She denies any back or flank pain                             ALLERGIES:  has No Known Allergies.  Meds: Current Outpatient Prescriptions  Medication Sig Dispense Refill  . ACTONEL 35 MG tablet every 7 (seven) days.       Marland Kitchen alum hydroxide-mag trisilicate (GAVISCON) 66-06 MG CHEW chewable tablet Chew 1 tablet by mouth.      Marland Kitchen aluminum-magnesium hydroxide-simethicone (MAALOX) [phone] MG/5ML SUSP Take 15 mLs by mouth as needed.        . Calcium Carbonate (CALTRATE 600 PO) Take 1 tablet by mouth 3 (three) times daily.       . calcium carbonate (TUMS - DOSED IN MG ELEMENTAL CALCIUM) 500 MG chewable tablet Chew 1 tablet by mouth as needed.        . cholecalciferol (VITAMIN D) 1000 UNITS tablet Take 1,000 Units by mouth daily.        Marland Kitchen esomeprazole (NEXIUM) 20 MG capsule Take 20 mg by mouth as needed.      . multivitamin (THERAGRAN) per tablet Take 1 tablet by mouth daily. 50+ advantage       . oxyCODONE-acetaminophen (PERCOCET) 5-325 MG per tablet Take 1 tablet by mouth every 6 (six) hours as needed.       . propranolol ER (INDERAL LA) 80 MG 24  hr capsule       . rosuvastatin (CRESTOR) 20 MG tablet Take 20 mg by mouth daily.        . vitamin C (ASCORBIC ACID) 500 MG tablet Take 500 mg by mouth daily.        . vitamin E (VITAMIN E) 400 UNIT capsule Take 400 Units by mouth daily.      Marland Kitchen zinc gluconate 50 MG tablet Take 50 mg by mouth daily.         No current facility-administered medications for this encounter.    Physical Findings: The patient is in no acute distress. Patient is alert and oriented.  weight is 186 lb 1.6 oz (84.414 kg). Her oral temperature is 97.9 F (36.6 C). Her blood pressure is 127/56 and her pulse is 52. Her respiration is 20 and oxygen saturation is 97%. . No palpable supraclavicular or axillary adenopathy. The lungs are clear to auscultation. The heart has regular rhythm and rate. The abdomen is soft and nontender with normal bowel sounds. No inguinal adenopathy is appreciated. On pelvic examination the external genitalia are unremarkable. Speculum exam is performed. No obvious mucosal lesions noted on exam. A Pap smear was obtained of the proximal  vagina and left lateral vaginal wall. On bimanual and rectovaginal examination there no pelvic masses appreciated. The patient appears to have a mild cystocele and rectocele.  Lab Findings: Lab Results  Component Value Date   WBC 6.8 06/08/2011   HGB 13.6 06/08/2011   HCT 40.7 06/08/2011   MCV 96.0 06/08/2011   PLT 225 06/08/2011      Radiographic Findings: No results found.  Impression:  No evidence of recurrence on clinical exam today, Pap smear pending  Plan:  The patient will proceed with CT scan in November and followup with gynecologic oncology in 4 months. She will return for followup in radiation oncology in 8  months.  ____________________________________ Blair Promise, MD

## 2014-06-10 NOTE — Progress Notes (Signed)
She is currently in no pain. Pt complains of, Loss of Sleep.  Reports occasional urinary incontinence.  Pt states they urinate 1 time per night.  Pt reports having loose bm's. Denies a vaginal bleeding or discharge.  The patient eats a regular, healthy diet.

## 2014-06-14 LAB — CYTOLOGY - PAP

## 2014-06-16 ENCOUNTER — Telehealth: Payer: Self-pay | Admitting: Oncology

## 2014-06-16 NOTE — Telephone Encounter (Signed)
Left a message requesting a call back.

## 2014-06-17 NOTE — Telephone Encounter (Signed)
Informed [name] of the good results on her pap smear per Dr. Sondra Come.  [name] verbalized understanding.

## 2014-07-20 ENCOUNTER — Other Ambulatory Visit: Payer: Medicare Other

## 2014-07-21 ENCOUNTER — Encounter (HOSPITAL_COMMUNITY): Payer: Self-pay

## 2014-07-21 ENCOUNTER — Ambulatory Visit (HOSPITAL_COMMUNITY)
Admission: RE | Admit: 2014-07-21 | Discharge: 2014-07-21 | Disposition: A | Discharge status: Home / Self Care | Payer: Medicare Other | Source: Ambulatory Visit | Attending: Gynecologic Oncology | Admitting: Gynecologic Oncology

## 2014-07-21 ENCOUNTER — Telehealth: Payer: Self-pay | Admitting: Gynecologic Oncology

## 2014-07-21 DIAGNOSIS — C55 Malignant neoplasm of uterus, part unspecified: Secondary | ICD-10-CM | POA: Insufficient documentation

## 2014-07-21 DIAGNOSIS — C541 Malignant neoplasm of endometrium: Secondary | ICD-10-CM

## 2014-07-21 DIAGNOSIS — Z08 Encounter for follow-up examination after completed treatment for malignant neoplasm: Secondary | ICD-10-CM | POA: Diagnosis not present

## 2014-07-21 MED ORDER — IOHEXOL 300 MG/ML  SOLN
100.0000 mL | Freq: Once | INTRAMUSCULAR | Status: AC | PRN
  Administered 2014-07-21: 100 mL via INTRAVENOUS

## 2014-07-21 NOTE — Telephone Encounter (Signed)
Message left about CT scan results.  Advised to call for any questions or concerns.

## 2014-10-28 ENCOUNTER — Encounter: Payer: Self-pay | Admitting: Gynecologic Oncology

## 2014-10-28 ENCOUNTER — Other Ambulatory Visit (HOSPITAL_COMMUNITY)
Admission: RE | Admit: 2014-10-28 | Discharge: 2014-10-28 | Disposition: A | Discharge status: Home / Self Care | Payer: Medicare Other | Source: Ambulatory Visit | Attending: Gynecologic Oncology | Admitting: Gynecologic Oncology

## 2014-10-28 ENCOUNTER — Ambulatory Visit: Payer: Medicare Other | Attending: Gynecologic Oncology | Admitting: Gynecologic Oncology

## 2014-10-28 VITALS — BP 132/59 | HR 63 | Temp 97.6°F | Resp 18 | Ht 63.0 in | Wt 190.8 lb

## 2014-10-28 DIAGNOSIS — Z01411 Encounter for gynecological examination (general) (routine) with abnormal findings: Secondary | ICD-10-CM | POA: Diagnosis present

## 2014-10-28 DIAGNOSIS — Z8542 Personal history of malignant neoplasm of other parts of uterus: Secondary | ICD-10-CM

## 2014-10-28 DIAGNOSIS — C541 Malignant neoplasm of endometrium: Secondary | ICD-10-CM

## 2014-10-28 NOTE — Progress Notes (Signed)
Consult Note: Gyn-Onc  [name] 74 y.o. female  CC:  Chief Complaint  Patient presents with  . Endometrial Cancer    HPI The patient returns for her scheduled followup. She completed radiation therapy to the periaortic chain in July of 2013. She apparently tolerated radiation well with no significant GI symptoms. Overall her functional status is excellent. She has no GI GU or pelvic symptoms.   Patient was initially diagnosed endometrial cancer in June 2009. She underwent a TAH BSO. 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 (Stage IA grade 1). Patient was followed until a nodule in the upper vagina was noted and confirmed by biopsy in September 2012 to be recurrent disease. As further staging the patient had a PET scan in October 2012 which showed hypermetabolic pelvic lymph nodes. The right external iliac node measuring 1.9 cm and a left posterior obturator node measured 0.5 cm. The patient was then treated with whole pelvis radiation therapy and vaginal brachytherapy. Followup PET scan showed resolution of the pelvic adenopathy, however, a new periaortic lymph node was identified. Subsequently the periaortic chain was radiated. She tolerated this therapy well. She was seen by Dr. Fermin Schwab November 2013. She had PET scan in November that was negative for recurrent disease.   I last saw her June 2015. Dr. Sondra Come last saw her 10/15.   CT 07/21/14 scan revealed:  IMPRESSION: 1. Stable CT. No evidence for residual tumor or metastatic disease within the chest abdomen or pelvis.  Pap smears in June and October 2015 were both negative.  Last colonoscopy: 05/07/14: She had a tubular adenoma resected at the ileocecal valve. There is no high-grade dysplasia or malignancy identified. Last MMG: 9/15, negative  Interval History:  She is overall doing fairly well.  Currently she is worried about the fatty liver noted on her CT  scan. We discussed that this could be secondary to her weight. She states she had a physical with her primary care physician and he told her that her liver functions were normal. We discussed diet and exercise. She has recently started walking at church where they have a track. However she states she then gets "lazy" and stops doing it. She does occasionally have some wheezing which she believes is due to her reflux. She denies any cough. She has regular bowel movements but since her cholecystectomy she tends to have some loose stools which are improved with Metamucil. She denies any vaginal bleeding. Tums and her family.  Review of Systems: Constitutional:  Denies fever. Skin: No rash, + dry skin Cardiovascular: No chest pain, shortness of breath, or edema  Pulmonary: No cough, occ wheeze as above.  Gastro Intestinal:  No nausea, vomiting, constipation, or diarrhea reported. + loose stools but no diarrhea. No bright red blood per rectum or change in bowel movement.  Genitourinary: No frequency, urgency, or dysuria.  Denies vaginal bleeding and discharge.  Musculoskeletal: No myalgia, arthralgia, joint swelling or pain.  Neurologic: No weakness, numbness, or change in gait.  Psychology: No changes   Current Meds:  Outpatient Encounter Prescriptions as of 10/28/2014  Medication Sig  . alum hydroxide-mag trisilicate (GAVISCON) 17-40 MG CHEW chewable tablet Chew 1 tablet by mouth.  Marland Kitchen aluminum-magnesium hydroxide-simethicone (MAALOX) [phone] MG/5ML SUSP Take 15 mLs by mouth as needed.    . Calcium Carbonate (CALTRATE 600 PO) Take 1 tablet by mouth 3 (three) times daily.   . calcium carbonate (TUMS - DOSED IN MG ELEMENTAL CALCIUM) 500 MG  chewable tablet Chew 1 tablet by mouth as needed.    . cholecalciferol (VITAMIN D) 1000 UNITS tablet Take 1,000 Units by mouth daily.    Marland Kitchen esomeprazole (NEXIUM) 20 MG capsule Take 20 mg by mouth as needed.  . multivitamin (THERAGRAN) per tablet Take 1 tablet by  mouth daily. 50+ advantage   . oxyCODONE-acetaminophen (PERCOCET) 5-325 MG per tablet Take 1 tablet by mouth every 6 (six) hours as needed.   . propranolol ER (INDERAL LA) 80 MG 24 hr capsule   . rosuvastatin (CRESTOR) 20 MG tablet Take 20 mg by mouth daily.    . vitamin C (ASCORBIC ACID) 500 MG tablet Take 500 mg by mouth daily.    . vitamin E (VITAMIN E) 400 UNIT capsule Take 400 Units by mouth daily.  Marland Kitchen zinc gluconate 50 MG tablet Take 50 mg by mouth daily.    . [DISCONTINUED] ACTONEL 35 MG tablet every 7 (seven) days.     Allergy: No Known Allergies  Social Hx:   History   Social History  . Marital Status: Single    Spouse Name: N/A  . Number of Children: N/A  . Years of Education: N/A   Occupational History  . Not on file.   Social History Main Topics  . Smoking status: Never Smoker   . Smokeless tobacco: Never Used  . Alcohol Use: No  . Drug Use: No  . Sexual Activity: No   Other Topics Concern  . Not on file   Social History Narrative    Past Surgical Hx:  Past Surgical History  Procedure Laterality Date  . Total abdominal hysterectomy w/ bilateral salpingoophorectomy  2009  . Cholecystectomy      Past Medical Hx:  Past Medical History  Diagnosis Date  . Migraines   . Hyperlipidemia   . GERD (gastroesophageal reflux disease)   . Arthritis   . Radiation 10/2011    External Beam/Intracavitary  . S/P radiation therapy 01/29/12 - 03/10/12    Periaortic area/ 5600 cgy/ 28 Fractions  . Endometrial cancer dx'd 01/2008    recurrences 08/2011; 01/2012; xrt comp 03/2012    Oncology Hx:    Endometrial cancer    Initial Diagnosis Endometrial cancer   02/14/2008 Surgery TAH/BSO IA grade 1   05/17/2011 Relapse/Recurrence + vaginal recurrence, + Pelvic node recurrence    - 07/17/2011 Radiation Therapy Pelvic and  vaginal brachytherapy completed    Relapse/Recurrence PA node recurrence    - 03/15/2012 Radiation Therapy     Family Hx:  Family History  Problem  Relation Age of Onset  . Heart failure Mother   . Ulcers Father     Vitals:  Blood pressure 132/59, pulse 63, temperature 97.6 F (36.4 C), temperature source Oral, resp. rate 18, height 5\' 3"  (1.6 m), weight 190 lb 12.8 oz (86.546 kg).  Physical Exam:  Well-nourished well-developed female in no acute distress.  Neck: Supple, no lymphadenopathy no thyromegaly.  Lungs: Clear to auscultation bilaterally.  Cardiovascular: Regular rate and rhythm.  Abdomen: Well-healed vertical midline incision. Abdomen is protuberant. There's no fluid wave there is no obvious masses. There is some voluntary guarding. There is no appreciable hernia. Exam limited by habitus.  Groins: No lymphadenopathy.  Extremities: No edema.  Pelvic: Normal external female genitalia. Positive cystocele. The vaginal cuff is without lesions. + cystocoele. ThinPrep Pap was submitted without difficulty. Bimanual examination reveals no masses or nodularity. Rectal confirms + rectocoele, no nodularity or masses.  Assessment/Plan:  74 year old with history of  a stage IA grade 1 endometrial carcinoma who suffered a recurrence of the vaginal cuff is well as para-aotic lymph nodes. By exam today she has no evidence of recurrent disease.  She will be due for a CT scan in November 2016. She will follow-up with Dr. Sondra Come in 3 months and return to see me in 6 months.  [name] A., MD 10/28/2014, 1:18 PM

## 2014-11-02 LAB — CYTOLOGY - PAP

## 2014-11-03 ENCOUNTER — Telehealth: Payer: Self-pay | Admitting: Gynecologic Oncology

## 2014-11-03 NOTE — Telephone Encounter (Signed)
Message left for patient with pap smear results: negative.  Instructed to call for any questions or concerns.  

## 2015-02-03 ENCOUNTER — Other Ambulatory Visit (HOSPITAL_COMMUNITY)
Admission: RE | Admit: 2015-02-03 | Discharge: 2015-02-03 | Disposition: A | Discharge status: Home / Self Care | Payer: Medicare Other | Source: Ambulatory Visit | Attending: Radiation Oncology | Admitting: Radiation Oncology

## 2015-02-03 ENCOUNTER — Encounter: Payer: Self-pay | Admitting: Radiation Oncology

## 2015-02-03 ENCOUNTER — Ambulatory Visit
Admission: RE | Admit: 2015-02-03 | Discharge: 2015-02-03 | Disposition: A | Discharge status: Home / Self Care | Payer: Medicare Other | Source: Ambulatory Visit | Attending: Radiation Oncology | Admitting: Radiation Oncology

## 2015-02-03 VITALS — BP 132/52 | HR 51 | Temp 97.9°F | Resp 20 | Ht 63.0 in | Wt 186.5 lb

## 2015-02-03 DIAGNOSIS — Z124 Encounter for screening for malignant neoplasm of cervix: Secondary | ICD-10-CM | POA: Insufficient documentation

## 2015-02-03 DIAGNOSIS — C549 Malignant neoplasm of corpus uteri, unspecified: Secondary | ICD-10-CM

## 2015-02-03 NOTE — Progress Notes (Signed)
Follow up /p radiation endometrial cancer over 3 years, denies any bleeding, discharge,  no pain, regular bowel movements, appetite good,no fatigue 9:41 AM BP 132/52 mmHg  Pulse 51  Temp(Src) 97.9 F (36.6 C) (Oral)  Resp 20  Ht 5\' 3"  (1.6 m)  Wt 186 lb 8 oz (84.596 kg)  BMI 33.05 kg/m2  Wt Readings from Last 3 Encounters:  10/28/14 190 lb 12.8 oz (86.546 kg)  02/18/14 185 lb 9.6 oz (84.188 kg)  07/16/13 180 lb 14.4 oz (82.056 kg)

## 2015-02-03 NOTE — Progress Notes (Signed)
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   Interval Since Last Radiation:  3 years  Narrative:  The patient returns today for routine follow-up. She stated that she only experiences pain "from working out in the yard." Denies vaginal bleeding or discolored vaginal discharge. Expierencing regular bowel movements. [name]'s appetite is healthy with no reported fatigue.                           ALLERGIES:  has No Known Allergies.  Meds: Current Outpatient Prescriptions  Medication Sig Dispense Refill  . alum hydroxide-mag trisilicate (GAVISCON) 33-82 MG CHEW chewable tablet Chew 1 tablet by mouth.    Marland Kitchen aluminum-magnesium hydroxide-simethicone (MAALOX) 505-397-67 MG/5ML SUSP Take 15 mLs by mouth as needed.      . Calcium Carbonate (CALTRATE 600 PO) Take 1 tablet by mouth 3 (three) times daily.     . calcium carbonate (TUMS - DOSED IN MG ELEMENTAL CALCIUM) 500 MG chewable tablet Chew 1 tablet by mouth as needed.      . cholecalciferol (VITAMIN D) 1000 UNITS tablet Take 1,000 Units by mouth daily.      . CRESTOR 40 MG tablet Take 40 mg by mouth daily.    Marland Kitchen esomeprazole (NEXIUM) 20 MG capsule Take 20 mg by mouth as needed.    . multivitamin (THERAGRAN) per tablet Take 1 tablet by mouth daily. 50+ advantage     . oxyCODONE-acetaminophen (PERCOCET) 5-325 MG per tablet Take 1 tablet by mouth every 6 (six) hours as needed.     . vitamin C (ASCORBIC ACID) 500 MG tablet Take 500 mg by mouth daily.      . vitamin E (VITAMIN E) 400 UNIT capsule Take 400 Units by mouth daily.    Marland Kitchen zinc gluconate 50 MG tablet Take 50 mg by mouth daily.      .  propranolol ER (INDERAL LA) 80 MG 24 hr capsule      No current facility-administered medications for this encounter.     Physical Findings: The patient is in no acute distress. Patient is alert and oriented.  No palpable supraclavicular or axillary adenopathy. The lungs are clear to auscultation. The heart has regular rhythm and rate. The abdomen is soft and nontender with normal bowel sounds. No inguinal adenopathy is appreciated. On pelvic examination the external genitalia are unremarkable. Speculum exam is performed. No obvious mucosal lesions noted on exam. Radiation changes are noted in the proximal vagina A Pap smear was obtained of the proximal vagina. On bimanual and rectovaginal examination there no pelvic masses appreciated. The patient appears to have a mild cystocele and rectocele. No inguinal adenopathy    Lab Findings: Lab Results  Component Value Date   WBC 6.8 06/08/2011   HGB 13.6 06/08/2011   HCT 40.7 06/08/2011   MCV 96.0 06/08/2011   PLT 225 06/08/2011      Radiographic Findings: No results found.   Impression:  No evidence of recurrence on clinical exam today, Pap smear pending  Plan:  CT scan scheduled for November of 2016 and appointment with Dr. Cottie Banda. Advised of follow appointment in  May 2017.   This document serves as a record of services personally performed by Gery Pray, MD. It was created on his behalf by Lenn Cal, a trained medical scribe. The creation of this record is based on the scribe's personal observations and the provider's statements to them. This document has been checked and approved by the attending provider.    ____________________________________  -----------------------------------  Blair Promise, PhD, MD

## 2015-02-04 LAB — CYTOLOGY - PAP

## 2015-02-11 ENCOUNTER — Telehealth: Payer: Self-pay | Admitting: Oncology

## 2015-02-11 NOTE — Telephone Encounter (Signed)
Left a message for Kensly regarding her pap smear results.  Requested a return call.

## 2015-02-14 ENCOUNTER — Telehealth: Payer: Self-pay | Admitting: Oncology

## 2015-02-14 NOTE — Telephone Encounter (Signed)
[name] called back. Advised her of the good pap smear results per Dr. Sondra Come.  [name] verbalized understanding.

## 2015-04-06 ENCOUNTER — Other Ambulatory Visit: Payer: Self-pay | Admitting: Gynecologic Oncology

## 2015-04-06 DIAGNOSIS — C549 Malignant neoplasm of corpus uteri, unspecified: Secondary | ICD-10-CM

## 2015-04-06 NOTE — Progress Notes (Signed)
CT arranged per Dr. Elenora Gamma last recommendations

## 2015-04-07 ENCOUNTER — Telehealth: Payer: Self-pay | Admitting: *Deleted

## 2015-04-07 NOTE — Telephone Encounter (Signed)
Notified pt of scheduled appointments. Pt is scheduled 07/29/15 for labs @[facility]  and 08/02/15 for CT scan. Pt was advised to pickup contrast and instructions prior to CT appointment. Pt agreed with times and dates of appointments.

## 2015-07-29 ENCOUNTER — Other Ambulatory Visit (HOSPITAL_BASED_OUTPATIENT_CLINIC_OR_DEPARTMENT_OTHER): Payer: Medicare Other

## 2015-07-29 DIAGNOSIS — C549 Malignant neoplasm of corpus uteri, unspecified: Secondary | ICD-10-CM

## 2015-07-29 LAB — BUN AND CREATININE (CC13)
BUN: 15.6 mg/dL (ref 7.0–26.0)
Creatinine: 0.8 mg/dL (ref 0.6–1.1)
EGFR: 76 mL/min/{1.73_m2} — ABNORMAL LOW (ref >90)

## 2015-08-02 ENCOUNTER — Telehealth: Payer: Self-pay

## 2015-08-02 ENCOUNTER — Ambulatory Visit (HOSPITAL_COMMUNITY)
Admission: RE | Admit: 2015-08-02 | Discharge: 2015-08-02 | Disposition: A | Discharge status: Home / Self Care | Payer: Medicare Other | Source: Ambulatory Visit | Attending: Gynecologic Oncology | Admitting: Gynecologic Oncology

## 2015-08-02 DIAGNOSIS — Z90710 Acquired absence of both cervix and uterus: Secondary | ICD-10-CM | POA: Insufficient documentation

## 2015-08-02 DIAGNOSIS — N8189 Other female genital prolapse: Secondary | ICD-10-CM | POA: Insufficient documentation

## 2015-08-02 DIAGNOSIS — C549 Malignant neoplasm of corpus uteri, unspecified: Secondary | ICD-10-CM | POA: Insufficient documentation

## 2015-08-02 MED ORDER — IOHEXOL 300 MG/ML  SOLN
100.0000 mL | Freq: Once | INTRAMUSCULAR | Status: AC | PRN
  Administered 2015-08-02: 100 mL via INTRAVENOUS

## 2015-08-02 NOTE — Telephone Encounter (Signed)
Orders received from [facility] to contact th patient to update with CT results obtained today . Results : no evidence of recurrent or metastatic disease within the chest , abdomen or pelvis. Patient contacted and updated , patient states understanding , denies further questions at this time .

## 2015-08-24 ENCOUNTER — Ambulatory Visit: Payer: Medicare Other | Attending: Gynecologic Oncology | Admitting: Gynecologic Oncology

## 2015-08-24 ENCOUNTER — Other Ambulatory Visit (HOSPITAL_COMMUNITY)
Admission: RE | Admit: 2015-08-24 | Discharge: 2015-08-24 | Disposition: A | Discharge status: Home / Self Care | Payer: Medicare Other | Source: Ambulatory Visit | Attending: Gynecologic Oncology | Admitting: Gynecologic Oncology

## 2015-08-24 ENCOUNTER — Encounter: Payer: Self-pay | Admitting: Gynecologic Oncology

## 2015-08-24 VITALS — BP 132/65 | HR 63 | Temp 97.4°F | Resp 18 | Ht 63.0 in | Wt 190.2 lb

## 2015-08-24 DIAGNOSIS — Z8542 Personal history of malignant neoplasm of other parts of uterus: Secondary | ICD-10-CM

## 2015-08-24 DIAGNOSIS — C541 Malignant neoplasm of endometrium: Secondary | ICD-10-CM | POA: Insufficient documentation

## 2015-08-24 DIAGNOSIS — Z01411 Encounter for gynecological examination (general) (routine) with abnormal findings: Secondary | ICD-10-CM | POA: Insufficient documentation

## 2015-08-24 NOTE — Patient Instructions (Addendum)
We will notify you of the results of your Pap smear. Please follow-up with Dr. Sondra Come in 6 months and Dr. Alycia Rossetti in 1 year with a CT scan in 11/17.  Please call our office after you see Dr. Sondra Come to schedule your CT and appt.

## 2015-08-24 NOTE — Progress Notes (Signed)
Consult Note: Gyn-Onc  [name] 74 y.o. female  CC:  Chief Complaint  Patient presents with  . Endometrial Cancer    Follow up    HPI The patient returns for her scheduled followup.   Patient was initially diagnosed endometrial cancer in June 2009. She underwent a TAH BSO. 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 (Stage IA grade 1). Patient was followed until a nodule in the upper vagina was noted and confirmed by biopsy in September 2012 to be recurrent disease. As further staging the patient had a PET scan in October 2012 which showed hypermetabolic pelvic lymph nodes. The right external iliac node measuring 1.9 cm and a left posterior obturator node measured 0.5 cm. The patient was then treated with whole pelvis radiation therapy and vaginal brachytherapy. Followup PET scan showed resolution of the pelvic adenopathy, however, a new periaortic lymph node was identified. Subsequently the periaortic chain was radiated. She tolerated this therapy well. She was seen by Dr. Loletta SpecterDianah Field November 2013. She had PET scan in November that was negative for recurrent disease.   CT 07/21/14 scan revealed:  IMPRESSION: 1. Stable CT. No evidence for residual tumor or metastatic disease within the chest abdomen or pelvis.  Pap smears in June and October 2015 were both negative.  Last colonoscopy: 05/07/14: She had a tubular adenoma resected at the ileocecal valve. There is no high-grade dysplasia or malignancy identified. Last MMG: 9/15, negative  I last saw her 2/16 and Dr. Sondra Come last saw her 6/16.  CT 08/02/15: IMPRESSION: 1. Status post hysterectomy. 2. No evidence of recurrent or metastatic disease within the chest, abdomen, or pelvis. 3. Pelvic floor laxity  Interval History:  She is overall doing fairly well.  6/16 pap smear was negative. She does occasionally have some stool incontinence which improves if she uses  Metamucil. It fairly uncommon. She occasionally does have back pain and she reported last year none currently. She denies any bleeding. She is up-to-date on her mammograms. She had her mammogram in the fall of this year. There are no new medical problems and her family. She did have an episode of pelvic discomfort and pressure earlier this year that is resolved.  Review of Systems: Constitutional:  Denies fever. Skin: No rash Cardiovascular: No chest pain, shortness of breath, or edema  Pulmonary: No cough, occ wheeze as above.  Gastro Intestinal:  No nausea, vomiting, constipation, or diarrhea reported. + loose stools but no diarrhea. No bright red blood per rectum or change in bowel movement. Occasional stool incontinence Genitourinary: No frequency, urgency, or dysuria.  Denies vaginal bleeding and discharge.  Musculoskeletal: No myalgia, arthralgia, joint swelling or pain.  Neurologic: No weakness, numbness, or change in gait.  Psychology: No changes   Current Meds:  Outpatient Encounter Prescriptions as of 08/24/2015  Medication Sig  . alum hydroxide-mag trisilicate (GAVISCON) [id number] MG CHEW chewable tablet Chew 1 tablet by mouth.  Marland Kitchen aluminum-magnesium hydroxide-simethicone (MAALOX) [id number] MG/5ML SUSP Take 15 mLs by mouth as needed.    . Calcium Carbonate (CALTRATE 600 PO) Take 1 tablet by mouth 3 (three) times daily.   . calcium carbonate (TUMS - DOSED IN MG ELEMENTAL CALCIUM) 500 MG chewable tablet Chew 1 tablet by mouth as needed.    . cholecalciferol (VITAMIN D) 1000 UNITS tablet Take 1,000 Units by mouth daily.    . CRESTOR 40 MG tablet Take 40 mg by mouth daily.  Marland Kitchen esomeprazole (NEXIUM) 20 MG capsule Take 20  mg by mouth as needed.  . multivitamin (THERAGRAN) per tablet Take 1 tablet by mouth daily. 50+ advantage   . propranolol ER (INDERAL LA) 80 MG 24 hr capsule   . vitamin C (ASCORBIC ACID) 500 MG tablet Take 500 mg by mouth daily.    . vitamin E (VITAMIN E) 400 UNIT  capsule Take 400 Units by mouth daily.  Marland Kitchen zinc gluconate 50 MG tablet Take 50 mg by mouth daily.    . [DISCONTINUED] oxyCODONE-acetaminophen (PERCOCET) 5-325 MG per tablet Take 1 tablet by mouth every 6 (six) hours as needed.    No facility-administered encounter medications on file as of 08/24/2015.    Allergy: No Known Allergies  Social Hx:   Social History   Social History  . Marital Status: Single    Spouse Name: N/A  . Number of Children: N/A  . Years of Education: N/A   Occupational History  . Not on file.   Social History Main Topics  . Smoking status: Never Smoker   . Smokeless tobacco: Never Used  . Alcohol Use: No  . Drug Use: No  . Sexual Activity: No   Other Topics Concern  . Not on file   Social History Narrative    Past Surgical Hx:  Past Surgical History  Procedure Laterality Date  . Total abdominal hysterectomy w/ bilateral salpingoophorectomy  2009  . Cholecystectomy      Past Medical Hx:  Past Medical History  Diagnosis Date  . Migraines   . Hyperlipidemia   . GERD (gastroesophageal reflux disease)   . Arthritis   . Radiation 10/2011    External Beam/Intracavitary  . S/P radiation therapy 01/29/12 - 03/10/12    Periaortic area/ 5600 cgy/ 28 Fractions  . Endometrial cancer ([place]) dx'd 01/2008    recurrences 08/2011; 01/2012; xrt comp 03/2012    Oncology Hx:    Endometrial cancer [facility])    Initial Diagnosis Endometrial cancer   02/14/2008 Surgery TAH/BSO IA grade 1   05/17/2011 Relapse/Recurrence + vaginal recurrence, + Pelvic node recurrence    - 07/17/2011 Radiation Therapy Pelvic and  vaginal brachytherapy completed    Relapse/Recurrence PA node recurrence    - 03/15/2012 Radiation Therapy     Family Hx:  Family History  Problem Relation Age of Onset  . Heart failure Mother   . Ulcers Father     Vitals:  Blood pressure 132/65, pulse 63, temperature 97.4 F (36.3 C), resp. rate 18, height 5\' 3"  (1.6 m), weight 190 lb 3.2 oz (86.274  kg).  Physical Exam:  Well-nourished well-developed female in no acute distress.  Neck: Supple, no lymphadenopathy no thyromegaly.  Lungs: Clear to auscultation bilaterally.  Cardiovascular: Regular rate and rhythm.  Abdomen: Well-healed vertical midline incision. Abdomen is protuberant. There's no fluid wave there is no obvious masses. There is some voluntary guarding. There is no appreciable hernia. Exam limited by habitus.  Groins: No lymphadenopathy.  Extremities: No edema.  Pelvic: Normal external female genitalia. Positive cystocele. The vaginal cuff is without lesions. + cystocoele. ThinPrep Pap was submitted without difficulty. Bimanual examination reveals no masses or nodularity. Rectal confirms + rectocoele, no nodularity or masses.  Assessment/Plan:  74 year old with history of a stage IA grade 1 endometrial carcinoma diagnosed in 2009 who suffered a recurrence of the vaginal cuff is well as para-aotic lymph nodes in 2012. By exam today she has no evidence of recurrent disease.   She will be due for a CT scan in November 2017. She will  follow-up with Dr. Sondra Come in 6 months and return to see me in 12 months.  Jamarie Mussa A., MD 08/24/2015, 1:27 PM

## 2015-08-26 LAB — CYTOLOGY - PAP

## 2015-08-31 ENCOUNTER — Telehealth: Payer: Self-pay

## 2015-08-31 NOTE — Telephone Encounter (Signed)
Orders received from [place], APNP to contact the patient to update with PAP results obtained on Aug 24, 2015 during her visit with Dr Alycia Rossetti was "normal" . Attempted to contact the patient , no anwser , left a detailed message with call number if addition questions arise.

## 2016-02-02 ENCOUNTER — Ambulatory Visit
Admission: RE | Admit: 2016-02-02 | Discharge: 2016-02-02 | Disposition: A | Discharge status: Home / Self Care | Payer: Medicare Other | Source: Ambulatory Visit | Attending: Radiation Oncology | Admitting: Radiation Oncology

## 2016-02-02 ENCOUNTER — Encounter: Payer: Self-pay | Admitting: Radiation Oncology

## 2016-02-02 VITALS — BP 152/64 | HR 45 | Temp 98.1°F | Ht 63.0 in | Wt 178.8 lb

## 2016-02-02 DIAGNOSIS — C549 Malignant neoplasm of corpus uteri, unspecified: Secondary | ICD-10-CM

## 2016-02-02 DIAGNOSIS — C772 Secondary and unspecified malignant neoplasm of intra-abdominal lymph nodes: Secondary | ICD-10-CM

## 2016-02-02 DIAGNOSIS — C541 Malignant neoplasm of endometrium: Secondary | ICD-10-CM | POA: Insufficient documentation

## 2016-02-02 DIAGNOSIS — C768 Malignant neoplasm of other specified ill-defined sites: Secondary | ICD-10-CM | POA: Diagnosis not present

## 2016-02-02 NOTE — Progress Notes (Signed)
Radiation Oncology         [phone] ________________________________  Name: [name] MRN: [id number]  Date: 02/02/2016  DOB: 1941/03/13  Follow-Up Visit Note  CC: Julia Patricia, MD  [name]  Diagnosis:   [name] is a 75 y.o. 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  Interval Since Last Radiation:  3 years and 11 months  1. External beam radiation therapy July 09, 2011 through August 16, 2011. 2. High-dose rate brachytherapy on January 19th, December 31st,     January 8th and January 15th. 3. 01/29/2012 through 03/10/2012: Periaortic area at 5600 cGy in 28 fractions  Narrative:  The patient returns today for routine follow-up. She reports having burning pain/pressure in her lower abdomen/pelvis in April and May and is wondering if it was from her bladder "dropping." She said it is not bothering her now. She denies dysuria. She denies having any bladder issues. She does report having loose bowels and said this has been going on for years. She denies having any vaginal/rectal bleeding or discharge. She reports that her energy level is good. Her heart rate today was 45. She denies feeling dizzy/lightheaded. She occasionally uses a dilator. She denies bleeding during use of the dilator.  The patient had a Pap smear on 08/24/15 by Dr. Alycia [name] which was negative for malignancy.  ALLERGIES:  has No Known Allergies.  Meds: Current Outpatient Prescriptions  Medication Sig Dispense Refill  . alum hydroxide-mag trisilicate (GAVISCON) [id number] MG CHEW chewable tablet Chew 1 tablet by mouth.    Marland Kitchen aluminum-magnesium hydroxide-simethicone (MAALOX) [id number] MG/5ML SUSP Take 15 mLs by mouth as needed.      . Calcium Carbonate (CALTRATE 600 PO) Take 1 tablet by mouth 3 (three) times daily.     . calcium carbonate (TUMS - DOSED IN MG ELEMENTAL CALCIUM) 500 MG chewable tablet  Chew 1 tablet by mouth as needed.      . cholecalciferol (VITAMIN D) 1000 UNITS tablet Take 1,000 Units by mouth daily.      . CRESTOR 40 MG tablet Take 40 mg by mouth daily.    Marland Kitchen esomeprazole (NEXIUM) 20 MG capsule Take 20 mg by mouth as needed.    . multivitamin (THERAGRAN) per tablet Take 1 tablet by mouth daily. 50+ advantage     . propranolol ER (INDERAL LA) 80 MG 24 hr capsule     . vitamin C (ASCORBIC ACID) 500 MG tablet Take 500 mg by mouth daily.      . vitamin E (VITAMIN E) 400 UNIT capsule Take 400 Units by mouth daily.    Marland Kitchen zinc gluconate 50 MG tablet Take 50 mg by mouth daily.       No current facility-administered medications for this encounter.     Physical Findings: There were no vitals filed for this visit. The patient is in no acute distress. Patient is alert and oriented. No palpable supraclavicular or axillary adenopathy. The lungs are clear to auscultation. The heart has regular rhythm and rate. The abdomen is soft and nontender with normal bowel sounds. No inguinal adenopathy is appreciated.  On pelvic examination the external genitalia are unremarkable. Speculum exam is performed. No obvious mucosal lesions noted on exam. Radiation changes are noted in the proximal vagina A Pap smear was NOT obtained of the proximal vagina. On bimanual and rectovaginal examination there no pelvic masses appreciated. The patient appears to have  a mild cystocele and rectocele. No inguinal adenopathy  Lab Findings: Lab Results  Component Value Date   WBC 6.8 06/08/2011   HGB 13.6 06/08/2011   HCT 40.7 06/08/2011   MCV 96.0 06/08/2011   PLT 225 06/08/2011    Radiographic Findings: No results found.  Impression:  No evidence of recurrence on clinical exam today. I will defer the Pap smear for the patient's next follow up with Dr. Alycia [name]. The patient's insurance did not cover the Pap smear last year.  Plan: She will have a CT scan in the fall and then she will see Dr. Alycia [name]. She  will follow up with radiation oncology in 1 year. ____________________________________  -----------------------------------  Julia Promise, PhD, MD  This document serves as a record of services personally performed by Gery Pray, MD. It was created on his behalf by Darcus Austin, a trained medical scribe. The creation of this record is based on the scribe's personal observations and the provider's statements to them. This document has been checked and approved by the attending provider.

## 2016-02-02 NOTE — Progress Notes (Addendum)
[name] here for follow up.  She reports having burning pain/pressure in her lower abdomen/pelvis in April and May and is wondering if it was from her bladder "dropping."  She said it is not bothering her now.  She denies having any bladder issues.  She does report having loose bowels and said this has been going on for years.  She denies having any vaginal/rectal bleeding or discharge.  She reports that her energy level is good.  Her heart rate today was 45.  She denies feeling dizzy/lightheaded.  She occasionally using a dilator.  BP 152/64 mmHg  Pulse 45  Temp(Src) 98.1 F (36.7 C) (Oral)  Ht 5\' 3"  (1.6 m)  Wt 178 lb 12.8 oz (81.103 kg)  BMI 31.68 kg/m2  SpO2 100%   Wt Readings from Last 3 Encounters:  02/02/16 178 lb 12.8 oz (81.103 kg)  08/24/15 190 lb 3.2 oz (86.274 kg)  02/03/15 186 lb 8 oz (84.596 kg)

## 2016-02-03 NOTE — Addendum Note (Signed)
Encounter addended by: Jacqulyn Liner, RN on: 02/03/2016  1:11 PM<BR>     Documentation filed: Charges VN

## 2016-04-26 ENCOUNTER — Other Ambulatory Visit: Payer: Self-pay | Admitting: Neurological Surgery

## 2016-04-27 ENCOUNTER — Other Ambulatory Visit: Payer: Self-pay | Admitting: Neurological Surgery

## 2016-04-27 DIAGNOSIS — M8448XA Pathological fracture, other site, initial encounter for fracture: Secondary | ICD-10-CM

## 2016-04-28 ENCOUNTER — Other Ambulatory Visit: Payer: Self-pay | Admitting: Neurological Surgery

## 2016-04-28 DIAGNOSIS — M8448XA Pathological fracture, other site, initial encounter for fracture: Secondary | ICD-10-CM

## 2016-04-30 ENCOUNTER — Encounter (HOSPITAL_COMMUNITY): Payer: Self-pay

## 2016-04-30 ENCOUNTER — Encounter (HOSPITAL_COMMUNITY)
Admission: RE | Admit: 2016-04-30 | Discharge: 2016-04-30 | Disposition: A | Discharge status: Home / Self Care | Payer: Medicare Other | Source: Ambulatory Visit | Attending: Neurological Surgery | Admitting: Neurological Surgery

## 2016-04-30 HISTORY — DX: Essential (primary) hypertension: I10

## 2016-04-30 HISTORY — DX: Anxiety disorder, unspecified: F41.9

## 2016-04-30 LAB — CBC
HCT: 43.8 % (ref 36.0–46.0)
HEMOGLOBIN: 14.3 g/dL (ref 12.0–15.0)
MCH: 31.2 pg (ref 26.0–34.0)
MCHC: 32.6 g/dL (ref 30.0–36.0)
MCV: 95.6 fL (ref 78.0–100.0)
Platelets: 245 10*3/uL (ref 150–400)
RBC: 4.58 MIL/uL (ref 3.87–5.11)
RDW: 13 % (ref 11.5–15.5)
WBC: 10 10*3/uL (ref 4.0–10.5)

## 2016-04-30 LAB — BASIC METABOLIC PANEL
Anion gap: 10 (ref 5–15)
BUN: 10 mg/dL (ref 6–20)
CHLORIDE: 106 mmol/L (ref 101–111)
CO2: 25 mmol/L (ref 22–32)
CREATININE: 0.64 mg/dL (ref 0.44–1.00)
Calcium: 10.2 mg/dL (ref 8.9–10.3)
GFR calc Af Amer: >60 mL/min (ref >60)
GFR calc non Af Amer: >60 mL/min (ref >60)
Glucose, Bld: 105 mg/dL — ABNORMAL HIGH (ref 65–99)
Potassium: 3.7 mmol/L (ref 3.5–5.1)
SODIUM: 141 mmol/L (ref 135–145)

## 2016-04-30 LAB — TYPE AND SCREEN
ABO/RH(D): A POS
Antibody Screen: NEGATIVE

## 2016-04-30 LAB — SURGICAL PCR SCREEN
MRSA, PCR: NEGATIVE
STAPHYLOCOCCUS AUREUS: POSITIVE — AB

## 2016-04-30 LAB — ABO/RH: ABO/RH(D): A POS

## 2016-04-30 NOTE — Pre-Procedure Instructions (Signed)
    [name]  04/30/2016      [place], [facility] [address] [place] [address] Phone: [phone] Fax: [phone]  [facility], [address] [place] [address] Phone: [phone] Fax: [phone]    Your procedure is scheduled on 05/02/16.  Report to [facility] Admitting at 530 A.M.  Call this number if you have problems the morning of surgery:  [phone]   Remember:  Do not eat food or drink liquids after midnight.  Take these medicines the morning of surgery with A SIP OF WATER --tylenol,nexium,propranolol   Do not wear jewelry, make-up or nail polish.  Do not wear lotions, powders, or perfumes, or deoderant.  Do not shave 48 hours prior to surgery.  Men may shave face and neck.  Do not bring valuables to the hospital.  [facility] is not responsible for any belongings or valuables.  Contacts, dentures or bridgework may not be worn into surgery.  Leave your suitcase in the car.  After surgery it may be brought to your room.  For patients admitted to the hospital, discharge time will be determined by your treatment team.  Patients discharged the day of surgery will not be allowed to drive home.   Name and phone number of your driver:   Special instructions:  Do not take any aspirin,anti-inflammatories,vitamins,or herbal supplements 5-7 days prior to surgery.  Please read over the following fact sheets that you were given. MRSA Information

## 2016-04-30 NOTE — Pre-Procedure Instructions (Signed)
[name]  04/30/2016      [place], [place] [facility] [address] [place] [address] Phone: [phone] Fax: [phone]  [facility] [place], [address] [place] [address] Phone: [phone] Fax: [phone]    Your procedure is scheduled on August 31  Report to [place] at [place].  Call this number if you have problems the morning of surgery:  [phone]   Remember:  Do not eat food or drink liquids after midnight.   Take these medicines the morning of surgery with A SIP OF WATER acetaminophen (tylenol), esomeprazole (nexium), propranolol (inderal)  7 days prior to surgery STOP taking any Aspirin, Aleve, Naproxen, Ibuprofen, Motrin, Advil, Goody's, BC's, all herbal medications, fish oil, and all vitamins    Do not wear jewelry, make-up or nail polish.  Do not wear lotions, powders, or perfumes, or deoderant.  Do not shave 48 hours prior to surgery.   Do not bring valuables to the hospital.  [facility] is not responsible for any belongings or valuables.  Contacts, dentures or bridgework may not be worn into surgery.  Leave your suitcase in the car.  After surgery it may be brought to your room.  For patients admitted to the hospital, discharge time will be determined by your treatment team.  Patients discharged the day of surgery will not be allowed to drive home.    Special instructions:   [facility]- Preparing For Surgery  Before surgery, you can play an important role. Because skin is not sterile, your skin needs to be as free of germs as possible. You can reduce the number of germs on your skin by washing with CHG (chlorahexidine gluconate) Soap before surgery.  CHG is an antiseptic cleaner which kills germs and bonds with the skin to continue killing germs even after washing.  Please do not use  if you have an allergy to CHG or antibacterial soaps. If your skin becomes reddened/irritated stop using the CHG.  Do not shave (including legs and underarms) for at least 48 hours prior to first CHG shower. It is OK to shave your face.  Please follow these instructions carefully.   1. Shower the NIGHT BEFORE SURGERY and the MORNING OF SURGERY with CHG.   2. If you chose to wash your hair, wash your hair first as usual with your normal shampoo.  3. After you shampoo, rinse your hair and body thoroughly to remove the shampoo.  4. Use CHG as you would any other liquid soap. You can apply CHG directly to the skin and wash gently with a scrungie or a clean washcloth.   5. Apply the CHG Soap to your body ONLY FROM THE NECK DOWN.  Do not use on open wounds or open sores. Avoid contact with your eyes, ears, mouth and genitals (private parts). Wash genitals (private parts) with your normal soap.  6. Wash thoroughly, paying special attention to the area where your surgery will be performed.  7. Thoroughly rinse your body with warm water from the neck down.  8. DO NOT shower/wash with your normal soap after using and rinsing off the CHG Soap.  9. Pat yourself dry with a CLEAN TOWEL.   10. Wear CLEAN PAJAMAS   11. Place CLEAN SHEETS on your bed the night of your first shower and DO NOT SLEEP WITH  PETS.    Day of Surgery: Do not apply any deodorants/lotions. Please wear clean clothes to the hospital/surgery center.      Please read over the following fact sheets that you were given. Pain Booklet, Coughing and Deep Breathing, Blood Transfusion Information, MRSA Information and Surgical Site Infection Prevention

## 2016-05-01 ENCOUNTER — Ambulatory Visit
Admission: RE | Admit: 2016-05-01 | Discharge: 2016-05-01 | Disposition: A | Discharge status: Home / Self Care | Payer: Medicare Other | Source: Ambulatory Visit | Attending: Neurological Surgery | Admitting: Neurological Surgery

## 2016-05-01 DIAGNOSIS — M8448XA Pathological fracture, other site, initial encounter for fracture: Secondary | ICD-10-CM

## 2016-05-01 MED ORDER — IOPAMIDOL (ISOVUE-300) INJECTION 61%: 75.0000 mL | Freq: Once | INTRAVENOUS | Status: DC | PRN

## 2016-05-01 MED ORDER — GADOBENATE DIMEGLUMINE 529 MG/ML IV SOLN
15.0000 mL | Freq: Once | INTRAVENOUS | Status: AC | PRN
  Administered 2016-05-01: 15 mL via INTRAVENOUS

## 2016-05-03 ENCOUNTER — Encounter (HOSPITAL_COMMUNITY): Payer: Self-pay | Admitting: *Deleted

## 2016-05-03 ENCOUNTER — Encounter (HOSPITAL_COMMUNITY)
Admission: RE | Disposition: A | Discharge status: Skilled Nursing Facility | Payer: Self-pay | Source: Ambulatory Visit | Attending: Neurological Surgery

## 2016-05-03 ENCOUNTER — Inpatient Hospital Stay (HOSPITAL_COMMUNITY): Payer: Medicare Other | Admitting: Certified Registered Nurse Anesthetist

## 2016-05-03 ENCOUNTER — Inpatient Hospital Stay (HOSPITAL_COMMUNITY): Payer: Medicare Other

## 2016-05-03 ENCOUNTER — Inpatient Hospital Stay (HOSPITAL_COMMUNITY)
Admission: RE | Admit: 2016-05-03 | Discharge: 2016-05-08 | DRG: 458 | Disposition: A | Discharge status: Skilled Nursing Facility | Payer: Medicare Other | Source: Ambulatory Visit | Attending: Neurological Surgery | Admitting: Neurological Surgery

## 2016-05-03 DIAGNOSIS — M5416 Radiculopathy, lumbar region: Secondary | ICD-10-CM | POA: Diagnosis present

## 2016-05-03 DIAGNOSIS — E785 Hyperlipidemia, unspecified: Secondary | ICD-10-CM | POA: Diagnosis present

## 2016-05-03 DIAGNOSIS — Z923 Personal history of irradiation: Secondary | ICD-10-CM | POA: Diagnosis not present

## 2016-05-03 DIAGNOSIS — M8008XA Age-related osteoporosis with current pathological fracture, vertebra(e), initial encounter for fracture: Secondary | ICD-10-CM | POA: Diagnosis present

## 2016-05-03 DIAGNOSIS — M4806 Spinal stenosis, lumbar region: Secondary | ICD-10-CM | POA: Diagnosis present

## 2016-05-03 DIAGNOSIS — Z79899 Other long term (current) drug therapy: Secondary | ICD-10-CM

## 2016-05-03 DIAGNOSIS — S32001A Stable burst fracture of unspecified lumbar vertebra, initial encounter for closed fracture: Secondary | ICD-10-CM | POA: Diagnosis present

## 2016-05-03 DIAGNOSIS — K219 Gastro-esophageal reflux disease without esophagitis: Secondary | ICD-10-CM | POA: Diagnosis present

## 2016-05-03 DIAGNOSIS — Z90710 Acquired absence of both cervix and uterus: Secondary | ICD-10-CM | POA: Diagnosis not present

## 2016-05-03 DIAGNOSIS — I10 Essential (primary) hypertension: Secondary | ICD-10-CM | POA: Diagnosis present

## 2016-05-03 DIAGNOSIS — S32031A Stable burst fracture of third lumbar vertebra, initial encounter for closed fracture: Secondary | ICD-10-CM | POA: Diagnosis present

## 2016-05-03 DIAGNOSIS — Z419 Encounter for procedure for purposes other than remedying health state, unspecified: Secondary | ICD-10-CM

## 2016-05-03 DIAGNOSIS — Z981 Arthrodesis status: Secondary | ICD-10-CM

## 2016-05-03 DIAGNOSIS — Z8542 Personal history of malignant neoplasm of other parts of uterus: Secondary | ICD-10-CM

## 2016-05-03 HISTORY — PX: LUMBAR PERCUTANEOUS PEDICLE SCREW 4 LEVEL: SHX6318

## 2016-05-03 HISTORY — PX: ANTERIOR LATERAL LUMBAR FUSION 4 LEVELS: SHX5552

## 2016-05-03 LAB — APTT: APTT: 27 s (ref 24–36)

## 2016-05-03 LAB — PROTIME-INR
INR: 1.08
PROTHROMBIN TIME: 14 s (ref 11.4–15.2)

## 2016-05-03 SURGERY — ANTERIOR LATERAL LUMBAR FUSION 4 LEVELS
Anesthesia: General | Laterality: Left

## 2016-05-03 MED ORDER — GABAPENTIN 300 MG PO CAPS
300.0000 mg | ORAL_CAPSULE | ORAL | Status: AC
  Administered 2016-05-03: 300 mg via ORAL
  Filled 2016-05-03: qty 1

## 2016-05-03 MED ORDER — NALOXONE HCL 0.4 MG/ML IJ SOLN
INTRAMUSCULAR | Status: DC | PRN
  Administered 2016-05-03 (×2): 120 ug via INTRAVENOUS

## 2016-05-03 MED ORDER — 0.9 % SODIUM CHLORIDE (POUR BTL) OPTIME
TOPICAL | Status: DC | PRN
  Administered 2016-05-03: 1000 mL

## 2016-05-03 MED ORDER — PROPOFOL 10 MG/ML IV BOLUS
INTRAVENOUS | Status: DC | PRN
Start: 2016-05-03 — End: 2016-05-03
  Administered 2016-05-03: 20 mg via INTRAVENOUS
  Administered 2016-05-03: 30 mg via INTRAVENOUS
  Administered 2016-05-03: 100 mg via INTRAVENOUS
  Administered 2016-05-03: 12 mg via INTRAVENOUS
  Administered 2016-05-03: 30 mg via INTRAVENOUS

## 2016-05-03 MED ORDER — GELATIN ABSORBABLE MT POWD
OROMUCOSAL | Status: DC | PRN
  Administered 2016-05-03 (×2): via TOPICAL

## 2016-05-03 MED ORDER — CHLORHEXIDINE GLUCONATE CLOTH 2 % EX PADS: 6.0000 | MEDICATED_PAD | Freq: Once | CUTANEOUS | Status: DC

## 2016-05-03 MED ORDER — ONDANSETRON HCL 4 MG/2ML IJ SOLN
INTRAMUSCULAR | Status: AC
  Filled 2016-05-03: qty 2

## 2016-05-03 MED ORDER — SUCCINYLCHOLINE CHLORIDE 20 MG/ML IJ SOLN
INTRAMUSCULAR | Status: DC | PRN
  Administered 2016-05-03: 80 mg via INTRAVENOUS

## 2016-05-03 MED ORDER — VITAMIN C 500 MG PO TABS
500.0000 mg | ORAL_TABLET | Freq: Every day | ORAL | Status: DC
  Administered 2016-05-04 – 2016-05-08 (×5): 500 mg via ORAL
  Filled 2016-05-03 (×5): qty 1

## 2016-05-03 MED ORDER — ZINC SULFATE 220 (50 ZN) MG PO CAPS
220.0000 mg | ORAL_CAPSULE | Freq: Every day | ORAL | Status: DC
  Administered 2016-05-04 – 2016-05-08 (×5): 220 mg via ORAL
  Filled 2016-05-03 (×6): qty 1

## 2016-05-03 MED ORDER — LIDOCAINE-EPINEPHRINE 1 %-1:100000 IJ SOLN
INTRAMUSCULAR | Status: DC | PRN
  Administered 2016-05-03: 10 mL

## 2016-05-03 MED ORDER — PROMETHAZINE HCL 25 MG/ML IJ SOLN: 6.2500 mg | INTRAMUSCULAR | Status: DC | PRN

## 2016-05-03 MED ORDER — CHLORHEXIDINE GLUCONATE CLOTH 2 % EX PADS
6.0000 | MEDICATED_PAD | Freq: Every day | CUTANEOUS | Status: AC
  Administered 2016-05-03 – 2016-05-04 (×2): 6 via TOPICAL

## 2016-05-03 MED ORDER — SODIUM CHLORIDE 0.9 % IV SOLN: 250.0000 mL | INTRAVENOUS | Status: DC

## 2016-05-03 MED ORDER — DEXAMETHASONE SODIUM PHOSPHATE 10 MG/ML IJ SOLN
INTRAMUSCULAR | Status: AC
  Filled 2016-05-03: qty 1

## 2016-05-03 MED ORDER — ACETAMINOPHEN 500 MG PO TABS
500.0000 mg | ORAL_TABLET | Freq: Three times a day (TID) | ORAL | Status: DC | PRN
  Administered 2016-05-06: 1000 mg via ORAL
  Filled 2016-05-03: qty 2

## 2016-05-03 MED ORDER — LIDOCAINE 2% (20 MG/ML) 5 ML SYRINGE
INTRAMUSCULAR | Status: AC
  Filled 2016-05-03: qty 5

## 2016-05-03 MED ORDER — LABETALOL HCL 5 MG/ML IV SOLN
INTRAVENOUS | Status: DC | PRN
  Administered 2016-05-03: 2 mL via INTRAVENOUS

## 2016-05-03 MED ORDER — FLEET ENEMA 7-19 GM/118ML RE ENEM
1.0000 | ENEMA | Freq: Once | RECTAL | Status: AC | PRN
  Administered 2016-05-07: 1 via RECTAL
  Filled 2016-05-03: qty 1

## 2016-05-03 MED ORDER — PANTOPRAZOLE SODIUM 20 MG PO TBEC
20.0000 mg | DELAYED_RELEASE_TABLET | Freq: Every day | ORAL | Status: DC
  Administered 2016-05-04 – 2016-05-08 (×5): 20 mg via ORAL
  Filled 2016-05-03 (×6): qty 1

## 2016-05-03 MED ORDER — ACETAMINOPHEN 500 MG PO TABS
1000.0000 mg | ORAL_TABLET | Freq: Four times a day (QID) | ORAL | Status: DC
  Administered 2016-05-03 – 2016-05-08 (×16): 1000 mg via ORAL
  Filled 2016-05-03 (×18): qty 2

## 2016-05-03 MED ORDER — EPHEDRINE SULFATE 50 MG/ML IJ SOLN
INTRAMUSCULAR | Status: DC | PRN
  Administered 2016-05-03: 5 mg via INTRAVENOUS
  Administered 2016-05-03 (×3): 10 mg via INTRAVENOUS
  Administered 2016-05-03: 5 mg via INTRAVENOUS
  Administered 2016-05-03 (×4): 10 mg via INTRAVENOUS
  Administered 2016-05-03: 5 mg via INTRAVENOUS
  Administered 2016-05-03 (×2): 10 mg via INTRAVENOUS
  Administered 2016-05-03: 20 mg via INTRAVENOUS
  Administered 2016-05-03: 10 mg via INTRAVENOUS
  Administered 2016-05-03: 5 mg via INTRAVENOUS

## 2016-05-03 MED ORDER — SUFENTANIL CITRATE 50 MCG/ML IV SOLN
INTRAVENOUS | Status: AC
  Filled 2016-05-03: qty 1

## 2016-05-03 MED ORDER — FENTANYL CITRATE (PF) 100 MCG/2ML IJ SOLN
INTRAMUSCULAR | Status: AC
  Filled 2016-05-03: qty 2

## 2016-05-03 MED ORDER — ZINC GLUCONATE 50 MG PO TABS: 50.0000 mg | ORAL_TABLET | Freq: Every day | ORAL | Status: DC

## 2016-05-03 MED ORDER — PHENYLEPHRINE 40 MCG/ML (10ML) SYRINGE FOR IV PUSH (FOR BLOOD PRESSURE SUPPORT)
PREFILLED_SYRINGE | INTRAVENOUS | Status: AC
  Filled 2016-05-03: qty 10

## 2016-05-03 MED ORDER — OXYCODONE HCL ER 20 MG PO T12A: 20.0000 mg | EXTENDED_RELEASE_TABLET | Freq: Two times a day (BID) | ORAL | Status: DC

## 2016-05-03 MED ORDER — VITAMIN E 180 MG (400 UNIT) PO CAPS
400.0000 [IU] | ORAL_CAPSULE | Freq: Every day | ORAL | Status: DC
  Administered 2016-05-04 – 2016-05-08 (×5): 400 [IU] via ORAL
  Filled 2016-05-03 (×6): qty 1

## 2016-05-03 MED ORDER — METHOCARBAMOL 750 MG PO TABS
750.0000 mg | ORAL_TABLET | Freq: Four times a day (QID) | ORAL | Status: DC
  Filled 2016-05-03 (×3): qty 1

## 2016-05-03 MED ORDER — HYDROMORPHONE HCL 1 MG/ML IJ SOLN
0.2500 mg | INTRAMUSCULAR | Status: DC | PRN
  Administered 2016-05-03 (×2): 0.5 mg via INTRAVENOUS

## 2016-05-03 MED ORDER — ROSUVASTATIN CALCIUM 20 MG PO TABS
20.0000 mg | ORAL_TABLET | Freq: Every day | ORAL | Status: DC
  Administered 2016-05-03 – 2016-05-08 (×6): 20 mg via ORAL
  Filled 2016-05-03 (×6): qty 1

## 2016-05-03 MED ORDER — DOCUSATE SODIUM 100 MG PO CAPS
100.0000 mg | ORAL_CAPSULE | Freq: Two times a day (BID) | ORAL | Status: DC
  Administered 2016-05-03 – 2016-05-08 (×10): 100 mg via ORAL
  Filled 2016-05-03 (×11): qty 1

## 2016-05-03 MED ORDER — CALCIUM CARBONATE 1500 (600 CA) MG PO TABS
600.0000 mg | ORAL_TABLET | Freq: Three times a day (TID) | ORAL | Status: DC
  Filled 2016-05-03 (×2): qty 1

## 2016-05-03 MED ORDER — METHOCARBAMOL 1000 MG/10ML IJ SOLN
750.0000 mg | INTRAVENOUS | Status: AC
  Administered 2016-05-03: 750 mg via INTRAVENOUS
  Filled 2016-05-03: qty 7.5

## 2016-05-03 MED ORDER — MENTHOL 3 MG MT LOZG: 1.0000 | LOZENGE | OROMUCOSAL | Status: DC | PRN

## 2016-05-03 MED ORDER — PROPRANOLOL HCL ER 80 MG PO CP24: 80.0000 mg | ORAL_CAPSULE | Freq: Once | ORAL | Status: DC

## 2016-05-03 MED ORDER — SUFENTANIL CITRATE 50 MCG/ML IV SOLN
INTRAVENOUS | Status: AC
  Filled 2016-05-03: qty 4

## 2016-05-03 MED ORDER — LACTATED RINGERS IV SOLN
INTRAVENOUS | Status: DC | PRN
  Administered 2016-05-03 (×2): via INTRAVENOUS

## 2016-05-03 MED ORDER — BISACODYL 5 MG PO TBEC: 5.0000 mg | DELAYED_RELEASE_TABLET | Freq: Every day | ORAL | Status: DC | PRN

## 2016-05-03 MED ORDER — SUFENTANIL 5MCG/ML (10ML) SYRINGE FOR MEDFUSION PUMP - OPTIME
INTRAVENOUS | Status: DC | PRN
  Administered 2016-05-03: .75 ug/kg/h via INTRAVENOUS
  Administered 2016-05-03 (×2): via INTRAVENOUS

## 2016-05-03 MED ORDER — PHENYLEPHRINE HCL 10 MG/ML IJ SOLN
INTRAVENOUS | Status: DC | PRN
  Administered 2016-05-03: 10 ug/min via INTRAVENOUS

## 2016-05-03 MED ORDER — HYDROMORPHONE HCL 1 MG/ML IJ SOLN
INTRAMUSCULAR | Status: AC
  Administered 2016-05-03: 0.5 mg via INTRAVENOUS
  Filled 2016-05-03: qty 1

## 2016-05-03 MED ORDER — LABETALOL HCL 5 MG/ML IV SOLN
INTRAVENOUS | Status: AC
  Filled 2016-05-03: qty 4

## 2016-05-03 MED ORDER — VITAMIN D 1000 UNITS PO TABS
1000.0000 [IU] | ORAL_TABLET | Freq: Every day | ORAL | Status: DC
  Administered 2016-05-04 – 2016-05-08 (×5): 1000 [IU] via ORAL
  Filled 2016-05-03 (×5): qty 1

## 2016-05-03 MED ORDER — ACETAMINOPHEN 10 MG/ML IV SOLN
INTRAVENOUS | Status: AC
  Filled 2016-05-03: qty 100

## 2016-05-03 MED ORDER — PROPOFOL 10 MG/ML IV BOLUS
INTRAVENOUS | Status: AC
  Filled 2016-05-03: qty 40

## 2016-05-03 MED ORDER — ROCURONIUM BROMIDE 10 MG/ML (PF) SYRINGE
PREFILLED_SYRINGE | INTRAVENOUS | Status: AC
  Filled 2016-05-03: qty 10

## 2016-05-03 MED ORDER — EPHEDRINE 5 MG/ML INJ
INTRAVENOUS | Status: AC
  Filled 2016-05-03: qty 10

## 2016-05-03 MED ORDER — LACTATED RINGERS IV SOLN
INTRAVENOUS | Status: DC | PRN
  Administered 2016-05-03 (×2): via INTRAVENOUS

## 2016-05-03 MED ORDER — BUPIVACAINE HCL 0.5 % IJ SOLN
INTRAMUSCULAR | Status: DC | PRN
  Administered 2016-05-03: 10 mL
  Administered 2016-05-03: 25 mL

## 2016-05-03 MED ORDER — PHENOL 1.4 % MT LIQD: 1.0000 | OROMUCOSAL | Status: DC | PRN

## 2016-05-03 MED ORDER — SODIUM CHLORIDE 0.9% FLUSH: 3.0000 mL | INTRAVENOUS | Status: DC | PRN

## 2016-05-03 MED ORDER — PROPRANOLOL HCL ER 80 MG PO CP24
80.0000 mg | ORAL_CAPSULE | Freq: Every day | ORAL | Status: DC
  Administered 2016-05-04 – 2016-05-08 (×3): 80 mg via ORAL
  Filled 2016-05-03 (×5): qty 1

## 2016-05-03 MED ORDER — CALCIUM CARBONATE ANTACID 500 MG PO CHEW
1.0000 | CHEWABLE_TABLET | ORAL | Status: DC | PRN
  Filled 2016-05-03: qty 1

## 2016-05-03 MED ORDER — MUPIROCIN 2 % EX OINT
1.0000 "application " | TOPICAL_OINTMENT | Freq: Two times a day (BID) | CUTANEOUS | Status: AC
  Administered 2016-05-03 – 2016-05-06 (×6): 1 via NASAL
  Filled 2016-05-03: qty 22

## 2016-05-03 MED ORDER — ALBUMIN HUMAN 5 % IV SOLN
INTRAVENOUS | Status: DC | PRN
  Administered 2016-05-03 (×2): via INTRAVENOUS

## 2016-05-03 MED ORDER — FENTANYL CITRATE (PF) 100 MCG/2ML IJ SOLN
INTRAMUSCULAR | Status: DC | PRN
  Administered 2016-05-03: 100 ug via INTRAVENOUS
  Administered 2016-05-03: 50 ug via INTRAVENOUS

## 2016-05-03 MED ORDER — GLUCOSAMINE-CHONDROITIN 500-400 MG PO TABS: 1.0000 | ORAL_TABLET | Freq: Every day | ORAL | Status: DC

## 2016-05-03 MED ORDER — BUPIVACAINE LIPOSOME 1.3 % IJ SUSP
20.0000 mL | INTRAMUSCULAR | Status: AC
  Filled 2016-05-03: qty 20

## 2016-05-03 MED ORDER — BACITRACIN 50000 UNITS IM SOLR
INTRAMUSCULAR | Status: DC | PRN
  Administered 2016-05-03: 08:00:00

## 2016-05-03 MED ORDER — LIDOCAINE HCL (CARDIAC) 20 MG/ML IV SOLN
INTRAVENOUS | Status: DC | PRN
  Administered 2016-05-03: 100 mg via INTRAVENOUS

## 2016-05-03 MED ORDER — OXYCODONE HCL 5 MG PO TABS
5.0000 mg | ORAL_TABLET | ORAL | Status: DC | PRN
  Administered 2016-05-03 – 2016-05-06 (×4): 5 mg via ORAL
  Administered 2016-05-06: 10 mg via ORAL
  Administered 2016-05-07: 5 mg via ORAL
  Administered 2016-05-07: 10 mg via ORAL
  Administered 2016-05-07 – 2016-05-08 (×2): 5 mg via ORAL
  Administered 2016-05-08: 10 mg via ORAL
  Administered 2016-05-08: 5 mg via ORAL
  Filled 2016-05-03: qty 2
  Filled 2016-05-03: qty 1
  Filled 2016-05-03: qty 2
  Filled 2016-05-03 (×6): qty 1
  Filled 2016-05-03: qty 2
  Filled 2016-05-03: qty 1

## 2016-05-03 MED ORDER — ACETAMINOPHEN 500 MG PO TABS
1000.0000 mg | ORAL_TABLET | ORAL | Status: AC
  Administered 2016-05-03 (×2): 1000 mg via ORAL
  Filled 2016-05-03: qty 2

## 2016-05-03 MED ORDER — VANCOMYCIN HCL 1000 MG IV SOLR
INTRAVENOUS | Status: AC
  Filled 2016-05-03: qty 1000

## 2016-05-03 MED ORDER — SUFENTANIL CITRATE 50 MCG/ML IV SOLN: INTRAVENOUS | Status: DC | PRN

## 2016-05-03 MED ORDER — ONDANSETRON HCL 4 MG/2ML IJ SOLN
4.0000 mg | INTRAMUSCULAR | Status: DC | PRN
  Administered 2016-05-03 (×3): 4 mg via INTRAVENOUS
  Filled 2016-05-03: qty 2

## 2016-05-03 MED ORDER — LIDOCAINE-EPINEPHRINE 2 %-1:100000 IJ SOLN
INTRAMUSCULAR | Status: DC | PRN
  Administered 2016-05-03: 25 mL via INTRADERMAL

## 2016-05-03 MED ORDER — BUPIVACAINE LIPOSOME 1.3 % IJ SUSP
INTRAMUSCULAR | Status: DC | PRN
  Administered 2016-05-03: 20 mL

## 2016-05-03 MED ORDER — SODIUM CHLORIDE 0.9 % IV SOLN
INTRAVENOUS | Status: DC
  Administered 2016-05-03: 16:00:00 via INTRAVENOUS

## 2016-05-03 MED ORDER — CEFAZOLIN SODIUM 1 G IJ SOLR
INTRAMUSCULAR | Status: AC
  Filled 2016-05-03: qty 50

## 2016-05-03 MED ORDER — SODIUM CHLORIDE 0.9 % IJ SOLN
INTRAMUSCULAR | Status: DC | PRN
  Administered 2016-05-03: 10 mL via INTRAVENOUS

## 2016-05-03 MED ORDER — CELECOXIB 200 MG PO CAPS
200.0000 mg | ORAL_CAPSULE | Freq: Two times a day (BID) | ORAL | Status: DC
  Administered 2016-05-04 – 2016-05-08 (×9): 200 mg via ORAL
  Filled 2016-05-03 (×10): qty 1

## 2016-05-03 MED ORDER — SENNA 8.6 MG PO TABS
1.0000 | ORAL_TABLET | Freq: Two times a day (BID) | ORAL | Status: DC
  Administered 2016-05-04 – 2016-05-08 (×9): 8.6 mg via ORAL
  Filled 2016-05-03 (×9): qty 1

## 2016-05-03 MED ORDER — GABAPENTIN 300 MG PO CAPS
300.0000 mg | ORAL_CAPSULE | Freq: Three times a day (TID) | ORAL | Status: DC
  Administered 2016-05-04 – 2016-05-08 (×14): 300 mg via ORAL
  Filled 2016-05-03 (×14): qty 1

## 2016-05-03 MED ORDER — CEFAZOLIN IN D5W 1 GM/50ML IV SOLN
1.0000 g | Freq: Three times a day (TID) | INTRAVENOUS | Status: AC
  Administered 2016-05-03 – 2016-05-04 (×2): 1 g via INTRAVENOUS
  Filled 2016-05-03 (×2): qty 50

## 2016-05-03 MED ORDER — SODIUM CHLORIDE 0.9% FLUSH
3.0000 mL | Freq: Two times a day (BID) | INTRAVENOUS | Status: DC
  Administered 2016-05-04 – 2016-05-07 (×6): 3 mL via INTRAVENOUS

## 2016-05-03 MED ORDER — ZOLPIDEM TARTRATE 5 MG PO TABS: 5.0000 mg | ORAL_TABLET | Freq: Every evening | ORAL | Status: DC | PRN

## 2016-05-03 MED ORDER — HYDROCODONE-ACETAMINOPHEN 7.5-325 MG PO TABS: 1.0000 | ORAL_TABLET | Freq: Once | ORAL | Status: DC | PRN

## 2016-05-03 MED ORDER — THROMBIN 20000 UNITS EX SOLR
CUTANEOUS | Status: DC | PRN
  Administered 2016-05-03: 08:00:00 via TOPICAL

## 2016-05-03 MED ORDER — GLYCOPYRROLATE 0.2 MG/ML IJ SOLN
INTRAMUSCULAR | Status: DC | PRN
  Administered 2016-05-03 (×2): 0.2 mg via INTRAVENOUS
  Administered 2016-05-03: 0.1 mg via INTRAVENOUS

## 2016-05-03 MED ORDER — ONDANSETRON HCL 4 MG/2ML IJ SOLN
INTRAMUSCULAR | Status: DC | PRN
Start: 2016-05-03 — End: 2016-05-03
  Administered 2016-05-03: 4 mg via INTRAVENOUS

## 2016-05-03 MED ORDER — CEFAZOLIN SODIUM-DEXTROSE 2-4 GM/100ML-% IV SOLN
2.0000 g | INTRAVENOUS | Status: AC
  Administered 2016-05-03 (×2): 2 g via INTRAVENOUS
  Filled 2016-05-03: qty 100

## 2016-05-03 MED ORDER — NALOXONE HCL 0.4 MG/ML IJ SOLN
INTRAMUSCULAR | Status: AC
  Filled 2016-05-03: qty 1

## 2016-05-03 MED ORDER — MIDAZOLAM HCL 2 MG/2ML IJ SOLN
INTRAMUSCULAR | Status: AC
  Filled 2016-05-03: qty 2

## 2016-05-03 SURGICAL SUPPLY — 102 items
ADH SKN CLS APL DERMABOND .7 (GAUZE/BANDAGES/DRESSINGS) ×6
APL SKNCLS STERI-STRIP NONHPOA (GAUZE/BANDAGES/DRESSINGS) ×2
BAG DECANTER FOR FLEXI CONT (MISCELLANEOUS) ×4 IMPLANT
BENZOIN TINCTURE PRP APPL 2/3 (GAUZE/BANDAGES/DRESSINGS) ×4 IMPLANT
BLADE CLIPPER SURG (BLADE) IMPLANT
BONE CANC CHIPS 20CC PCAN1/4 (Bone Implant) ×4 IMPLANT
BONE MATRIX OSTEOCEL PRO MED (Bone Implant) ×2 IMPLANT
BUR MATCHSTICK NEURO 3.0 LAGG (BURR) ×4 IMPLANT
BUR ROUND FLUTED 5 RND (BURR) ×3 IMPLANT
BUR ROUND FLUTED 5MM RND (BURR) ×1
CAGE ANT XCORE AUTOLOCK 18X41 (Neuro Prosthesis/Implant) IMPLANT
CANISTER SUCT 3000ML PPV (MISCELLANEOUS) ×8 IMPLANT
CAP END 2 TI LOCK SCREW X-CORE (Screw) IMPLANT
CHIPS CANC BONE 20CC PCAN1/4 (Bone Implant) ×2 IMPLANT
CHLORAPREP W/TINT 26ML (MISCELLANEOUS) ×4 IMPLANT
CORE XCORE2 TI 18X28-41 AUTO (Neuro Prosthesis/Implant) ×2 IMPLANT
COVER BACK TABLE 24X17X13 BIG (DRAPES) IMPLANT
DECANTER SPIKE VIAL GLASS SM (MISCELLANEOUS) ×4 IMPLANT
DERMABOND ADVANCED (GAUZE/BANDAGES/DRESSINGS) ×6
DERMABOND ADVANCED .7 DNX12 (GAUZE/BANDAGES/DRESSINGS) ×4 IMPLANT
DRAPE C-ARM 42X72 X-RAY (DRAPES) ×10 IMPLANT
DRAPE C-ARMOR (DRAPES) ×10 IMPLANT
DRAPE LAPAROTOMY 100X72X124 (DRAPES) ×4 IMPLANT
DRAPE MICROSCOPE LEICA (MISCELLANEOUS) ×2 IMPLANT
DRAPE POUCH INSTRU U-SHP 10X18 (DRAPES) ×4 IMPLANT
DRAPE SURG 17X23 STRL (DRAPES) ×4 IMPLANT
DRSG OPSITE POSTOP 4X6 (GAUZE/BANDAGES/DRESSINGS) ×2 IMPLANT
DRSG OPSITE POSTOP 4X8 (GAUZE/BANDAGES/DRESSINGS) ×4 IMPLANT
ELECT BLADE 4.0 EZ CLEAN MEGAD (MISCELLANEOUS) ×4
ELECT REM PT RETURN 9FT ADLT (ELECTROSURGICAL) ×4
ELECTRODE BLDE 4.0 EZ CLN MEGD (MISCELLANEOUS) IMPLANT
ELECTRODE REM PT RTRN 9FT ADLT (ELECTROSURGICAL) ×2 IMPLANT
ENDCAP X-CORE II 22X50 (Cap) ×4 IMPLANT
GAUZE SPONGE 4X4 12PLY STRL (GAUZE/BANDAGES/DRESSINGS) IMPLANT
GAUZE SPONGE 4X4 16PLY XRAY LF (GAUZE/BANDAGES/DRESSINGS) IMPLANT
GLOVE BIOGEL PI IND STRL 7.5 (GLOVE) ×4 IMPLANT
GLOVE BIOGEL PI INDICATOR 7.5 (GLOVE) ×4
GLOVE ECLIPSE 7.5 STRL STRAW (GLOVE) ×6 IMPLANT
GLOVE EXAM NITRILE LRG STRL (GLOVE) IMPLANT
GLOVE EXAM NITRILE XL STR (GLOVE) IMPLANT
GLOVE EXAM NITRILE XS STR PU (GLOVE) IMPLANT
GLOVE INDICATOR 7.5 STRL GRN (GLOVE) ×10 IMPLANT
GLOVE SS BIOGEL STRL SZ 7 (GLOVE) ×6 IMPLANT
GLOVE SUPERSENSE BIOGEL SZ 7 (GLOVE) ×10
GOWN STRL REUS W/ TWL LRG LVL3 (GOWN DISPOSABLE) ×4 IMPLANT
GOWN STRL REUS W/ TWL XL LVL3 (GOWN DISPOSABLE) ×2 IMPLANT
GOWN STRL REUS W/TWL 2XL LVL3 (GOWN DISPOSABLE) IMPLANT
GOWN STRL REUS W/TWL LRG LVL3 (GOWN DISPOSABLE) ×8
GOWN STRL REUS W/TWL XL LVL3 (GOWN DISPOSABLE) ×8
GRAFT BNE CANC CHIPS 1-8 20CC (Bone Implant) IMPLANT
GUIDEWIRE NITINOL BEVEL TIP (WIRE) ×16 IMPLANT
HEMOSTAT POWDER KIT SURGIFOAM (HEMOSTASIS) ×6 IMPLANT
KIT BASIN OR (CUSTOM PROCEDURE TRAY) ×4 IMPLANT
KIT DILATOR XLIF 5 (KITS) IMPLANT
KIT ROOM TURNOVER OR (KITS) ×4 IMPLANT
KIT SURGICAL ACCESS MAXCESS 4 (KITS) ×2 IMPLANT
KIT XLIF (KITS) ×2
MODULE NVM5 NEXT GEN EMG (NEEDLE) ×2 IMPLANT
NDL HYPO 21X1.5 SAFETY (NEEDLE) ×4 IMPLANT
NDL HYPO 25X1 1.5 SAFETY (NEEDLE) ×2 IMPLANT
NDL I-PASS III (NEEDLE) IMPLANT
NEEDLE HYPO 21X1.5 SAFETY (NEEDLE) ×12 IMPLANT
NEEDLE HYPO 25X1 1.5 SAFETY (NEEDLE) ×4 IMPLANT
NEEDLE I-PASS III (NEEDLE) ×4 IMPLANT
NS IRRIG 1000ML POUR BTL (IV SOLUTION) ×4 IMPLANT
PACK LAMINECTOMY NEURO (CUSTOM PROCEDURE TRAY) ×4 IMPLANT
PACK UNIVERSAL I (CUSTOM PROCEDURE TRAY) ×4 IMPLANT
PAD ARMBOARD 7.5X6 YLW CONV (MISCELLANEOUS) ×12 IMPLANT
PATTIES SURGICAL .5X1.5 (GAUZE/BANDAGES/DRESSINGS) ×4 IMPLANT
ROD PRECEPT TI 130MM LORD PB (Rod) ×4 IMPLANT
RUBBERBAND STERILE (MISCELLANEOUS) ×4 IMPLANT
SCREW LOCK RELINE 5.5 TULIP (Screw) ×16 IMPLANT
SCREW RELINE RED 5.5X50MM (Screw) ×8 IMPLANT
SCREW RELINE RED 6.5X50MM POLY (Screw) ×8 IMPLANT
SCREW SET ENDCAP (Screw) ×8 IMPLANT
SPONGE LAP 4X18 X RAY DECT (DISPOSABLE) ×4 IMPLANT
SPONGE NEURO XRAY DETECT 1X3 (DISPOSABLE) ×4 IMPLANT
SPONGE SURGIFOAM ABS GEL 100 (HEMOSTASIS) ×4 IMPLANT
STAPLER VISISTAT 35W (STAPLE) ×4 IMPLANT
STRIP SURGICAL 1 X 6 IN (GAUZE/BANDAGES/DRESSINGS) IMPLANT
STRIP SURGICAL 1/2 X 6 IN (GAUZE/BANDAGES/DRESSINGS) IMPLANT
STRIP SURGICAL 1/4 X 6 IN (GAUZE/BANDAGES/DRESSINGS) IMPLANT
STRIP SURGICAL 3/4 X 6 IN (GAUZE/BANDAGES/DRESSINGS) IMPLANT
SUT STRATAFIX MNCRL+ 3-0 PS-2 (SUTURE) ×6
SUT STRATAFIX MONOCRYL 3-0 (SUTURE) ×6
SUT STRATAFIX SPIRAL + 2-0 (SUTURE) ×4 IMPLANT
SUT VIC AB 0 CT1 18XCR BRD8 (SUTURE) ×4 IMPLANT
SUT VIC AB 0 CT1 8-18 (SUTURE)
SUT VIC AB 1 CT1 18XBRD ANBCTR (SUTURE) ×2 IMPLANT
SUT VIC AB 1 CT1 8-18 (SUTURE) ×4
SUT VIC AB 2-0 CT1 18 (SUTURE) ×6 IMPLANT
SUT VIC AB 3-0 SH 8-18 (SUTURE) ×4 IMPLANT
SUT VIC AB 4-0 PS2 27 (SUTURE) ×2 IMPLANT
SUTURE STRATFX MNCRL+ 3-0 PS-2 (SUTURE) IMPLANT
SYR 20CC LL (SYRINGE) ×4 IMPLANT
SYR 30ML LL (SYRINGE) ×6 IMPLANT
TOWEL OR 17X24 6PK STRL BLUE (TOWEL DISPOSABLE) ×4 IMPLANT
TOWEL OR 17X26 10 PK STRL BLUE (TOWEL DISPOSABLE) ×4 IMPLANT
TRAY FOLEY W/METER SILVER 16FR (SET/KITS/TRAYS/PACK) ×4 IMPLANT
TUBE CONNECTING 12'X1/4 (SUCTIONS) ×1
TUBE CONNECTING 12X1/4 (SUCTIONS) ×3 IMPLANT
WATER STERILE IRR 1000ML POUR (IV SOLUTION) ×4 IMPLANT

## 2016-05-03 NOTE — OR Nursing (Signed)
nuvasive electrodes applied to lower upper extremity prior to positioning

## 2016-05-03 NOTE — Anesthesia Preprocedure Evaluation (Addendum)
Anesthesia Evaluation  Patient identified by MRN, date of birth, ID band Patient awake    Reviewed: Allergy & Precautions, H&P , NPO status , Patient's Chart, lab work & pertinent test results  History of Anesthesia Complications Negative for: history of anesthetic complications  Airway Mallampati: II  TM Distance: >3 FB Neck ROM: full    Dental no notable dental hx. (+) Teeth Intact, Dental Advisory Given   Pulmonary neg pulmonary ROS,    Pulmonary exam normal breath sounds clear to auscultation       Cardiovascular hypertension, Normal cardiovascular exam Rhythm:regular Rate:Normal     Neuro/Psych  Headaches, Anxiety    GI/Hepatic Neg liver ROS, GERD  ,  Endo/Other  negative endocrine ROS  Renal/GU negative Renal ROS     Musculoskeletal  (+) Arthritis ,   Abdominal   Peds  Hematology negative hematology ROS (+)   Anesthesia Other Findings   Reproductive/Obstetrics negative OB ROS                            Anesthesia Physical Anesthesia Plan  ASA: III  Anesthesia Plan: General   Post-op Pain Management:    Induction: Intravenous  Airway Management Planned: Oral ETT  Additional Equipment: Arterial line  Intra-op Plan:   Post-operative Plan: Possible Post-op intubation/ventilation  Informed Consent: I have reviewed the patients History and Physical, chart, labs and discussed the procedure including the risks, benefits and alternatives for the proposed anesthesia with the patient or authorized representative who has indicated his/her understanding and acceptance.   Dental Advisory Given  Plan Discussed with: Anesthesiologist, CRNA and Surgeon  Anesthesia Plan Comments: (Rec sufentanil infusion (if monitoring) and ketamine with induction )       Anesthesia Quick Evaluation

## 2016-05-03 NOTE — Progress Notes (Signed)
eLink Physician-Brief Progress Note Patient Name: MEDIA ADEL DOB: 27-Aug-1941 MRN: [id number]   Date of Service  05/03/2016  HPI/Events of Note  S/p lumbar spine surgery.  Hemodynamics stable.   eICU Interventions  None.     Intervention Category Major Interventions: Other:  Asad Keeven 05/03/2016, 7:08 PM

## 2016-05-03 NOTE — Anesthesia Procedure Notes (Signed)
Procedure Name: Intubation Date/Time: 05/03/2016 8:02 AM Performed by: Tressia Miners LEFFEW Pre-anesthesia Checklist: Patient identified, Patient being monitored, Timeout performed, Emergency Drugs available and Suction available Patient Re-evaluated:Patient Re-evaluated prior to inductionOxygen Delivery Method: Circle System Utilized Preoxygenation: Pre-oxygenation with 100% oxygen Intubation Type: IV induction Ventilation: Mask ventilation without difficulty and Oral airway inserted - appropriate to patient size Laryngoscope Size: Mac and 3 Grade View: Grade I Tube type: Oral Tube size: 7.5 mm Number of attempts: 1 Airway Equipment and Method: Stylet Placement Confirmation: ETT inserted through vocal cords under direct vision,  positive ETCO2 and breath sounds checked- equal and bilateral Secured at: 23 cm Tube secured with: Tape Dental Injury: Teeth and Oropharynx as per pre-operative assessment

## 2016-05-03 NOTE — Brief Op Note (Signed)
05/03/2016  4:07 PM  PATIENT:  [name]  75 y.o. female  PRE-OPERATIVE DIAGNOSIS:  Lumbar three Compression fracture, Lumbar stenosis  POST-OPERATIVE DIAGNOSIS:  Lumbar three Compression fracture, Lumbar stenosis  PROCEDURE:  Procedure(s): Lumbar three Corpectomy  with Lumbar one to lumbar five fixation fusion with percutaneous pedicle screws: left hemilaminectomy open reduction fracture fragment (Left) LUMBAR PERCUTANEOUS PEDICLE SCREW 4 LEVEL (Bilateral)  SURGEON:  Surgeon(s) and Role:    * Tamala Fothergill, MD - Primary    * Eustace Moore, MD - Assisting  PHYSICIAN ASSISTANT:   ASSISTANTS: Eustace Moore, MD  ANESTHESIA:   general  EBL:  Total I/O In: 3800 [I.V.:3300; IV Piggyback:500] Out: 3180 [Urine:2830; Blood:350]  BLOOD ADMINISTERED:none  DRAINS: none   LOCAL MEDICATIONS USED:  MARCAINE    and LIDOCAINE   SPECIMEN:  Excision  DISPOSITION OF SPECIMEN:  PATHOLOGY  COUNTS:  YES  TOURNIQUET:  * No tourniquets in log *  DICTATION: .Dragon Dictation  PLAN OF CARE: Admit to inpatient   PATIENT DISPOSITION:  ICU - extubated and stable.   Delay start of Pharmacological VTE agent (>24hrs) due to surgical blood loss or risk of bleeding: yes

## 2016-05-03 NOTE — Transfer of Care (Signed)
Immediate Anesthesia Transfer of Care Note  Patient: [name]  Procedure(s) Performed: Procedure(s): Lumbar three Corpectomy  with Lumbar one to lumbar five fixation fusion with percutaneous pedicle screws: left hemilaminectomy open reduction fracture fragment (Left) LUMBAR PERCUTANEOUS PEDICLE SCREW 4 LEVEL (Bilateral)  Patient Location: PACU  Anesthesia Type:General  Level of Consciousness: sedated  Airway & Oxygen Therapy: Patient Spontanous Breathing and Patient connected to nasal cannula oxygen  Post-op Assessment: Report given to RN and Post -op Vital signs reviewed and stable  Post vital signs: Reviewed and stable  Last Vitals:  Vitals:   05/03/16 0616 05/03/16 0639  BP: (!) 145/53   Pulse: (!) 58 (!) 56  Resp: 20   Temp: 36.8 C     Last Pain:  Vitals:   05/03/16 1610  TempSrc:   PainSc: Asleep      Patients Stated Pain Goal: 3 ([id number])  Complications: No apparent anesthesia complications

## 2016-05-03 NOTE — OR Nursing (Signed)
nuvasive electrodes removed from upper and lower extremity

## 2016-05-03 NOTE — H&P (Signed)
CC:  No chief complaint on file.   HPI: [name] is a 75 y.o. 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  She has back and left leg pain, left leg numbness, and left foot weakness.  PMH: Past Medical History:  Diagnosis Date  . Anxiety   . Arthritis   . Endometrial cancer ([place]) dx'd 01/2008   recurrences 08/2011; 01/2012; xrt comp 03/2012  . GERD (gastroesophageal reflux disease)   . Hyperlipidemia   . Hypertension   . Migraines   . Radiation 10/2011   External Beam/Intracavitary  . S/P radiation therapy 01/29/12 - 03/10/12   Periaortic area/ 5600 cgy/ 28 Fractions    PSH: Past Surgical History:  Procedure Laterality Date  . CHOLECYSTECTOMY    . EYE SURGERY    . TOTAL ABDOMINAL HYSTERECTOMY W/ BILATERAL SALPINGOOPHORECTOMY  2009    SH: Social History  Substance Use Topics  . Smoking status: Never Smoker  . Smokeless tobacco: Never Used  . Alcohol use No    MEDS: Prior to Admission medications   Medication Sig Start Date End Date Taking? Authorizing Provider  acetaminophen (TYLENOL) 500 MG tablet Take 500-1,000 mg by mouth every 8 (eight) hours as needed for mild pain or moderate pain.   Yes Historical Provider, MD  Calcium Carbonate (CALTRATE 600 PO) Take 1 tablet by mouth 3 (three) times daily.    Yes Historical Provider, MD  calcium carbonate (TUMS - DOSED IN MG ELEMENTAL CALCIUM) 500 MG chewable tablet Chew 1 tablet by mouth as needed.     Yes Historical Provider, MD  cholecalciferol (VITAMIN D) 1000 UNITS tablet Take 1,000 Units by mouth daily.     Yes Historical Provider, MD  CRESTOR 40 MG tablet Take 20 mg by mouth daily.  01/18/15  Yes Historical Provider, MD  glucosamine-chondroitin 500-400 MG tablet Take 1 tablet by mouth daily.   Yes Historical Provider, MD  Multiple Vitamin (DAILY VALUE MULTIVITAMIN PO) Take 1 tablet by mouth daily.   Yes Historical Provider, MD  propranolol ER (INDERAL LA) 80 MG  24 hr capsule Take 80 mg by mouth daily.  11/29/11  Yes Historical Provider, MD  psyllium (METAMUCIL) 58.6 % powder Take 1 packet by mouth daily as needed.    Yes Historical Provider, MD  vitamin C (ASCORBIC ACID) 500 MG tablet Take 500 mg by mouth daily.   07/22/11  Yes Historical Provider, MD  vitamin E (VITAMIN E) 400 UNIT capsule Take 400 Units by mouth daily.   Yes Historical Provider, MD  zinc gluconate 50 MG tablet Take 50 mg by mouth daily.   07/22/11  Yes Historical Provider, MD  esomeprazole (NEXIUM) 20 MG capsule Take 20 mg by mouth daily as needed.     Historical Provider, MD    ALLERGY: No Known Allergies  ROS: ROS  NEUROLOGIC EXAM: Awake, alert, oriented Memory and concentration grossly intact Speech fluent, appropriate CN grossly intact Motor exam: Upper Extremities Deltoid Bicep Tricep Grip  Right 5/5 5/5 5/5 5/5  Left 5/5 5/5 5/5 5/5   Lower Extremity IP Quad PF DF EHL  Right 5/5 5/5 5/5 5/5 5/5  Left 5/5 5/5 4/5 4/5 5/5   Sensation grossly intact to LT  IMAGING: No new imaging  IMPRESSION: - 75 y.o. female with an osteoporotic burst fracture of L3 and neurological deficits as described above.  She has had a thorough imaging workup which does not suggest malignancy at this level.  I have recommended a lateral L3 corpectomy with percutaneous pedicle screw fixation from L1-5.  We had a long discussion about the risks and benefits of surgery, including alternatives to surgery, and she wishes to proceed.  PLAN: - L3 corpectomy with L1-5 percutaneous pedicle screw fixation

## 2016-05-03 NOTE — Progress Notes (Signed)
PHARMACIST - PHYSICIAN ORDER COMMUNICATION  CONCERNING: P&T Medication Policy on Herbal Medications  DESCRIPTION:  This patient's order for:  glucosamine-chondroitin has been noted.  This product(s) is classified as an "herbal" or natural product. Due to a lack of definitive safety studies or FDA approval, nonstandard manufacturing practices, plus the potential risk of unknown drug-drug interactions while on inpatient medications, the Pharmacy and Therapeutics Committee does not permit the use of "herbal" or natural products of this type within [facility].   ACTION TAKEN: The pharmacy department is unable to verify this order at this time.  Please reevaluate patient's clinical condition at discharge and address if the herbal or natural product(s) should be resumed at that time.  Georga Bora, PharmD Clinical Pharmacist Pager: [phone] 05/03/2016 7:03 PM

## 2016-05-04 LAB — CBC
HEMATOCRIT: 32.2 % — AB (ref 36.0–46.0)
HEMOGLOBIN: 10 g/dL — AB (ref 12.0–15.0)
MCH: 30.9 pg (ref 26.0–34.0)
MCHC: 31.1 g/dL (ref 30.0–36.0)
MCV: 99.4 fL (ref 78.0–100.0)
Platelets: 162 10*3/uL (ref 150–400)
RBC: 3.24 MIL/uL — AB (ref 3.87–5.11)
RDW: 13.3 % (ref 11.5–15.5)
WBC: 10 10*3/uL (ref 4.0–10.5)

## 2016-05-04 LAB — BASIC METABOLIC PANEL
ANION GAP: 10 (ref 5–15)
BUN: 7 mg/dL (ref 6–20)
CALCIUM: 8.6 mg/dL — AB (ref 8.9–10.3)
CHLORIDE: 108 mmol/L (ref 101–111)
CO2: 25 mmol/L (ref 22–32)
Creatinine, Ser: 0.74 mg/dL (ref 0.44–1.00)
GFR calc non Af Amer: >60 mL/min (ref >60)
Glucose, Bld: 150 mg/dL — ABNORMAL HIGH (ref 65–99)
POTASSIUM: 3.5 mmol/L (ref 3.5–5.1)
Sodium: 143 mmol/L (ref 135–145)

## 2016-05-04 MED ORDER — CALCIUM CARBONATE 1250 (500 CA) MG PO TABS
1.0000 | ORAL_TABLET | Freq: Three times a day (TID) | ORAL | Status: DC
  Administered 2016-05-04 – 2016-05-08 (×15): 500 mg via ORAL
  Filled 2016-05-04 (×15): qty 1

## 2016-05-04 MED ORDER — METHOCARBAMOL 750 MG PO TABS
750.0000 mg | ORAL_TABLET | Freq: Four times a day (QID) | ORAL | Status: DC | PRN
  Administered 2016-05-07 – 2016-05-08 (×3): 750 mg via ORAL
  Filled 2016-05-04 (×3): qty 1

## 2016-05-04 NOTE — Evaluation (Signed)
Physical Therapy Evaluation Patient Details Name: [name] MRN: [id number] DOB: [birth year] Today's Date: 05/04/2016   History of Present Illness  patient is a 75 yo female s/p left hemilaminectomy open reduction fracture fragment (Left)  Clinical Impression  Patient demonstrates deficits in functional mobility as indicated below. Will need continued skilled PT to address deficits and maximize function. Will see as indicated and progress as tolerated. BP assessed dur to patient reports od dizziness, flush/pale coloring, "hot face" sensation and overall nauseated and unwell feeling. + orthostatic 80s/50s x2. patient assisted back to bed, symptoms improved. nsg aware.    Follow Up Recommendations SNF;Supervision/Assistance - 24 hour    Equipment Recommendations  Rolling walker with 5" wheels;3in1 (PT)    Recommendations for Other Services       Precautions / Restrictions Precautions Precautions: Back Precaution Booklet Issued: Yes (comment) Precaution Comments: verbally reviewed with patient Required Braces or Orthoses: Spinal Brace Spinal Brace: Thoracolumbosacral orthotic;Applied in sitting position Restrictions Weight Bearing Restrictions: No      Mobility  Bed Mobility Overal bed mobility: Needs Assistance Bed Mobility: Rolling;Sidelying to Sit;Sit to Sidelying Rolling: Min assist Sidelying to sit: Mod assist;+2 for physical assistance     Sit to sidelying: Min assist;+2 for physical assistance General bed mobility comments: requires assist for all aspects of bed mobility, VCs for technique and positioning, increased time and effort to perform  Transfers Overall transfer level: Needs assistance Equipment used: Rolling walker (2 wheeled) Transfers: Sit to/from [facility] Sit to Stand: Min assist;+2 safety/equipment Stand pivot transfers: Min assist;+2 safety/equipment       General transfer comment: Vcs for hand placement, assist to power up to  standing Bilaterally, +2 assist for safety.  Ambulation/Gait Ambulation/Gait assistance: Min assist Ambulation Distance (Feet): 6 Feet Assistive device: Rolling walker (2 wheeled) Gait Pattern/deviations: Step-to pattern;Decreased stride length;Shuffle     General Gait Details: limited by pain and dizziness  Stairs            Wheelchair Mobility    Modified Rankin (Stroke Patients Only)       Balance Overall balance assessment: Needs assistance;History of Falls Sitting-balance support: Feet supported Sitting balance-Leahy Scale: Fair     Standing balance support: Bilateral upper extremity supported Standing balance-Leahy Scale: Poor Standing balance comment: heavy relaince on UE support for stability at this time                             Pertinent Vitals/Pain Pain Assessment: Faces Faces Pain Scale: Hurts even more Pain Location: back and legs Pain Descriptors / Indicators: Moaning;Grimacing Pain Intervention(s): Monitored during session    Home Living Family/patient expects to be discharged to:: Private residence Living Arrangements: Alone   Type of Home: Mobile home Home Access: Stairs to enter   Technical brewer of Steps: 4 Home Layout: One level Home Equipment: None      Prior Function Level of Independence: Independent               Hand Dominance   Dominant Hand: Right    Extremity/Trunk Assessment               Lower Extremity Assessment: Generalized weakness;LLE deficits/detail   LLE Deficits / Details: modest asymetrical weakness noted gross motions  Cervical / Trunk Assessment:  (s/p spinal surgery)  Communication   Communication: HOH  Cognition Arousal/Alertness: Awake/alert Behavior During Therapy: Anxious Overall Cognitive Status: No family/caregiver present to determine baseline cognitive  functioning                      General Comments General comments (skin integrity, edema, etc.):  patient transferred to [facility] x2 during session, assist provided for hygiene and pericare. BP assessed dur to patient reports od dizziness, flush/pale coloring, "hot face" sensation and overall nauseated and unwell feeling. + orthostatic 80s/50s x2. patient assisted back to bed, symptoms improved. nsg aware.    Exercises        Assessment/Plan    PT Assessment Patient needs continued PT services  PT Diagnosis Difficulty walking;Abnormality of gait;Generalized weakness;Acute pain   PT Problem List Decreased strength;Decreased activity tolerance;Decreased balance;Decreased mobility;Decreased coordination;Decreased knowledge of use of DME;Decreased knowledge of precautions;Impaired sensation;Pain  PT Treatment Interventions DME instruction;Gait training;Stair training;Functional mobility training;Therapeutic activities;Therapeutic exercise;Balance training;Patient/family education   PT Goals (Current goals can be found in the Care Plan section) Acute Rehab PT Goals Patient Stated Goal: to get better PT Goal Formulation: With patient Time For Goal Achievement: 05/18/16 Potential to Achieve Goals: Good    Frequency Min 5X/week   Barriers to discharge Inaccessible home environment;Decreased caregiver support      Co-evaluation               End of Session Equipment Utilized During Treatment: Gait belt;Back brace;Oxygen Activity Tolerance: No increased pain;Treatment limited secondary to medical complications (Comment) (symptomatic BP 80s/50s) Patient left: in bed;with call bell/phone within reach;with bed alarm set;with family/visitor present;with SCD's reapplied Nurse Communication: Mobility status;Precautions         Time: [id number] PT Time Calculation (min) (ACUTE ONLY): 40 min   Charges:   PT Evaluation $PT Eval Moderate Complexity: 1 Procedure PT Treatments $Therapeutic Activity: 8-22 mins   PT G CodesDuncan Dull 05/25/2016, 12:12 PM Alben Deeds, [place]  DPT  [phone]

## 2016-05-04 NOTE — Progress Notes (Signed)
Pt received at this time with no noted distress. Pt stable, neuro intact. Surgical dressing sites to mid back and left flank dry and intact. Old drainage marked to dressing on mid back. Safety measures in place. Pt orientated to room. Call bell within reach. Will continue to monitor. TLSO brace delivered at this time.

## 2016-05-04 NOTE — Progress Notes (Signed)
OT Cancellation Note  Patient Details Name: [name] MRN: [id number] DOB: 1941/04/02   Cancelled Treatment:    Reason Eval/Treat Not Completed: Other (comment). Waiting on brace to arrive.   [place], OTR/L  [id number] 05/04/2016 05/04/2016, 10:56 AM

## 2016-05-04 NOTE — Anesthesia Postprocedure Evaluation (Signed)
Anesthesia Post Note  Patient: TYKERA CANELO  Procedure(s) Performed: Procedure(s) (LRB): Lumbar three Corpectomy  with Lumbar one to lumbar five fixation fusion with percutaneous pedicle screws: left hemilaminectomy open reduction fracture fragment (Left) LUMBAR PERCUTANEOUS PEDICLE SCREW 4 LEVEL (Bilateral)  Patient location during evaluation: PACU Anesthesia Type: General Level of consciousness: awake and alert Pain management: pain level controlled Vital Signs Assessment: post-procedure vital signs reviewed and stable Respiratory status: spontaneous breathing, nonlabored ventilation, respiratory function stable and patient connected to nasal cannula oxygen Cardiovascular status: blood pressure returned to baseline and stable Postop Assessment: no signs of nausea or vomiting Anesthetic complications: no    Last Vitals:  Vitals:   05/04/16 1109 05/04/16 1115  BP: (!) 115/42 (!) 119/56  Pulse: 91 94  Resp: 18 20  Temp: 36.8 C 36.9 C    Last Pain:  Vitals:   05/04/16 1115  TempSrc: Oral  PainSc:                  Zenaida Deed

## 2016-05-04 NOTE — Progress Notes (Signed)
Occupational Therapy Evaluation Patient Details Name: [name] MRN: [id number] DOB: February 24, 1941 Today's Date: 05/04/2016    History of Present Illness patient is a 75 yo female s/p left hemilaminectomy open reduction fracture fragment (Left)   Clinical Impression   PTA, pt lived independently alone. Pt became orthostatic during mobility (BP83/52) and was unable to tolerate sittingin chair. Currently requries min A +2 for safety and Max A with LB ADL. Pt to wear TLSO when OOB and per Dr.Ditty OK for pt to transfer to [facility] without brace. Pt will benefit from rehab at SNF. Will follow acutely to maximize functional level of independence with ADL and facilitate safe D/C to [facility].     Follow Up Recommendations  SNF;Supervision/Assistance - 24 hour    Equipment Recommendations  3 in 1 bedside comode;Tub/shower bench    Recommendations for Other Services       Precautions / Restrictions Precautions Precautions: Back Precaution Booklet Issued: Yes (comment) Precaution Comments: verbally reviewed with patient Required Braces or Orthoses: Spinal Brace Spinal Brace: Thoracolumbosacral orthotic;Applied in sitting position Restrictions Weight Bearing Restrictions: No      Mobility Bed Mobility Overal bed mobility: Needs Assistance Bed Mobility: Rolling;Sidelying to Sit;Sit to Sidelying Rolling: Min assist Sidelying to sit: Mod assist;+2 for physical assistance     Sit to sidelying: Min assist;+2 for physical assistance General bed mobility comments: requires assist for all aspects of bed mobility, VCs for technique and positioning, increased time and effort to perform  Transfers Overall transfer level: Needs assistance Equipment used: Rolling walker (2 wheeled) Transfers: Sit to/from [facility] Sit to Stand: Min assist;+2 safety/equipment Stand pivot transfers: Min assist;+2 safety/equipment       General transfer comment: Vcs for hand placement, assist to  power up to standing Bilaterally, +2 assist for safety.    Balance Overall balance assessment: Needs assistance;History of Falls Sitting-balance support: Feet supported Sitting balance-Leahy Scale: Fair     Standing balance support: Bilateral upper extremity supported Standing balance-Leahy Scale: Poor Standing balance comment: heavy relaince on UE support for stability at this time                            ADL Overall ADL's : Needs assistance/impaired     Grooming: Supervision/safety;Set up;Sitting   Upper Body Bathing: Minimal assitance;Sitting Upper Body Bathing Details (indicate cue type and reason): difficulty sitting without UE support Lower Body Bathing: Moderate assistance;Sit to/from stand   Upper Body Dressing : Sitting;Moderate assistance Upper Body Dressing Details (indicate cue type and reason): to donn TLSO Lower Body Dressing: Maximal assistance;Sit to/from stand   Toilet Transfer: Minimal assistance;RW;+2 for physical assistance   Toileting- Clothing Manipulation and Hygiene: Total assistance       Functional mobility during ADLs: +2 for safety/equipment;Minimal assistance;Rolling walker;Cueing for safety;Cueing for sequencing General ADL Comments: Pt requries max vc to follow back precautions     Vision     Perception     Praxis      Pertinent Vitals/Pain Pain Assessment: Faces Faces Pain Scale: Hurts even more Pain Location: back and L leg Pain Descriptors / Indicators: Aching;Grimacing;Guarding;Moaning Pain Intervention(s): Limited activity within patient's tolerance;Repositioned     Hand Dominance Right   Extremity/Trunk Assessment Upper Extremity Assessment Upper Extremity Assessment: Overall WFL for tasks assessed   Lower Extremity Assessment Lower Extremity Assessment: Defer to PT evaluation LLE Deficits / Details: modest asymetrical weakness noted gross motions LLE Sensation: decreased light touch LLE Coordination:  decreased fine motor   Cervical / Trunk Assessment Cervical / Trunk Assessment:  (s/p spinal surgery)   Communication Communication Communication: HOH   Cognition Arousal/Alertness: Awake/alert Behavior During Therapy: Anxious Overall Cognitive Status: No family/caregiver present to determine baseline cognitive functioning                     General Comments       Exercises       Shoulder Instructions      Home Living Family/patient expects to be discharged to:: Private residence Living Arrangements: Alone   Type of Home: Mobile home Home Access: Stairs to enter Technical brewer of Steps: 4   Home Layout: One level     Bathroom Shower/Tub: Occupational psychologist: Standard Bathroom Accessibility: Yes How Accessible: Accessible via walker Home Equipment: None   Additional Comments: mobile home      Prior Functioning/Environment Level of Independence: Independent             OT Diagnosis: Generalized weakness;Acute pain   OT Problem List: Decreased strength;Decreased range of motion;Decreased activity tolerance;Impaired balance (sitting and/or standing);Decreased safety awareness;Decreased knowledge of use of DME or AE;Decreased knowledge of precautions;Obesity;Pain   OT Treatment/Interventions: Self-care/ADL training;DME and/or AE instruction;Therapeutic activities;Patient/family education;Balance training    OT Goals(Current goals can be found in the care plan section) Acute Rehab OT Goals Patient Stated Goal: to get better OT Goal Formulation: With patient Time For Goal Achievement: 05/18/16 Potential to Achieve Goals: Good ADL Goals Pt Will Perform Lower Body Bathing: with min assist;with adaptive equipment;sit to/from stand (with minvc for back precautions) Pt Will Perform Lower Body Dressing: with min guard assist;sit to/from stand;with adaptive equipment (with min vc for back precautions) Pt Will Transfer to Toilet: with  supervision;ambulating;bedside commode (RW) Pt Will Perform Toileting - Clothing Manipulation and hygiene: with min assist;sitting/lateral leans;sit to/from stand;with adaptive equipment  OT Frequency: Min 2X/week   Barriers to D/C:            Co-evaluation PT/OT/SLP Co-Evaluation/Treatment: Yes Reason for Co-Treatment: For patient/therapist safety   OT goals addressed during session: ADL's and self-care      End of Session Equipment Utilized During Treatment: Gait belt;Rolling walker;Back brace;Oxygen Nurse Communication: Mobility status;Precautions;Other (comment) (BP)  Activity Tolerance: Treatment limited secondary to medical complications (Comment) (BP 83/52) Patient left: in bed;with call bell/phone within reach;with bed alarm set;with SCD's reapplied   Time: 1110-1151 OT Time Calculation (min): 41 min Charges:  OT Evaluation $OT Eval Moderate Complexity: 1 Procedure G-Codes:    Gurinder Toral,HILLARY 05-08-16, 1:42 PM   [facility], OTR/L  [phone] 2016-05-08

## 2016-05-04 NOTE — Progress Notes (Signed)
PT Cancellation Note  Patient Details Name: FAYTHE HULSE MRN: [id number] DOB: 1941-05-06   Cancelled Treatment:    Reason Eval/Treat Not Completed: Patient not medically ready;Other (comment) (does not have back brace at this time)   Duncan Dull 05/04/2016, 10:50 AM  Alben Deeds, PT DPT  [phone]

## 2016-05-04 NOTE — Op Note (Signed)
05/03/2016  7:51 AM  PATIENT:  [name]  75 y.o. female  PRE-OPERATIVE DIAGNOSIS:  L3 burst fracture; lumbar radiculopathy  POST-OPERATIVE DIAGNOSIS:  Same  PROCEDURE:  Lateral L3 corpectomy with placement of expandable cage; use of morsellized allograft; left L3 hemilaminotomy for open reduction of fracture fragments with use of operating microscope; Bilateral pedicle screw fixation L1, L2, L4, and L5 for stabilization.  SURGEON:  Aldean Ast, MD  ASSISTANTS: Eustace Moore, MD  ANESTHESIA:   General  DRAINS: None   SPECIMEN:  L3 vertebralbody  INDICATION FOR PROCEDURE: [name] is a 75 year old female with a pathological burst fracture of L3 causing lumbar radiculopathy.  She has weakness in her distal lower extremity.  Because she has neurological deficits associated with this I recommended the above procedure. Patient understood the risks, benefits, and alternatives and potential outcomes and wished to proceed.  PROCEDURE DETAILS: The patient was brought to the operating room.  After smooth induction of general endotracheal anesthesia the patient was placed in the lateral position with the left side up and secured with tape.  Localizing views were taken with fluoroscopy.  The operative site was then prepped and draped in the usual sterile fashion.  The planned incision was infiltrated with lidocaine and marcaine with epinephrine.   The skin was sharply incised and the oblique abdominal muscles were opened with monopolar cautery.  I encountered an easily dissected fat plane, consistent with the retroperitoneum.  I then hooked my finger under the abdominal muscles anteriorly and further opened them with monopolar cautery.  I inserted the dilator and approached the middle third of the L3-4 disc space.  I confirmed that I was not in contact with any nerves of the lumbar plexus.  The tract was sequentially dilated and then the retractor was introduced.  I incised the disc  space and then passed a Cobb elevator along each endplate and penetrated the annulus on the contralateral side.  I then used box cutters, a paddle, and a rasp to complete the discectomy.  This process was then repeated at L2-3.    The retractor was then repositioned to the midpoint of the L3 vertebral body and opened to expose the vertebral body and the adjacent disc spaces.  Using Leksell and Kerrison rongeurs greater than 50 percent of the vertebral body was resected.  Care was taken not to enter the epidural space and to avoid the vascular structures anterior to the spine.  Trial endplate caps for the cage were placed to confirm the appropriate size.  An expandable cage was assembled and filled with morselized allograft.  It was inserted into the corpectomy defect and expanded until finger tight.  Lateral fluoroscopy revealed there had been some subsidence of the cage into the L4 endplate.  The incisions was closed in layers with interrupted vicryl sutures.  The skin was closed with running monocryl stratafix suture and dermabond.  Attention was then turned to open reduction of the fracture fragments.  An appropriate length incision for minimally invasive pedicle screw placement was made approximately 4cm left of midline from L1-5.  Using lateral fluoroscopy a spinal needle was inserted through the fascia to the level of the lamina at L3.  The lumbodorsal fascia was then incised at this point.  This was then penetrated with a dilator. The level was then confirmed. The dilator was used to scrape the muscle from the lamina at the appropriate level. The tract was sequentially dilated and then the [facility]  retractor was inserted over the dilators.  The operating microscope was brought into the field. Using microsurgical technique the remaining adherent soft tissue was removed from the lamina. The high-speed drill was used to thin the inferior lamina of L3.  The hemilaminectomy was completed with Kerrison  punches.  The ligamentum flavum was elevated with a nerve hook and then resected with Kerrison punches. Epidural fat was suctioned away to reveal the thecal sac. Residual ligament was resected.  The thecal sac was freed ventrally and supported medially.  Using a coronary dilator I was able to palpate a retropulsed fracture fragment.  Using a tamp and mallet I was able to reduce the fracture fragments and decompress the neural elements. I palpated the ventral edge of the thecal sac and nerve root to determine if there was no residual compression. I was satisfied that this was the case.  I irrigated vigorously with bacitracin saline. There was excellent hemostasis.   Attention was then turned to pedicle screw fixation.  Using AP and lateral fluoroscopy a Jamshidi needle was inserted into the junction of the facet joint and transverse process at L5 on the left. The pedicle was cannulated and a K wire was placed. This was repeated at L4, L2, and then L1. These tracts were dilated. Pedicle screws were then placed over the K wire. This was repeated on the right side.  The rods were then inserted into the pedicle screws. Set screws were placed and finally tightened.  I irrigated again. The wounds were then closed  with interrupted Vicryl sutures in the lumbodorsal fascia.  The deep dermal layers were closed with running 2-0 PDS stratafix suture. The skin was closed with a running 3-0 monocryl suture. It was then sealed with Dermabond.  The patient was then turned supine on a stretcher.  PATIENT DISPOSITION:  ICU - extubated and stable.   Delay start of Pharmacological VTE agent (>24hrs) due to surgical blood loss or risk of bleeding:  yes

## 2016-05-04 NOTE — Progress Notes (Signed)
Orthopedic Tech Progress Note Patient Details:  [name] 1941/06/16 [id number]  Patient ID: [name], female   DOB: 1940/12/09, 75 y.o.   MRN: [id number]   [name] 05/04/2016, 9:24 AMCalled [employer] for TLSO brace.

## 2016-05-04 NOTE — Care Management Note (Signed)
Case Management Note  Patient Details  Name: [name] MRN: [id number] Date of Birth: 1941-05-06  Subjective/Objective:   Pt underwent: Lumbar three Corpectomy  with Lumbar one to lumbar five fixation fusion with percutaneous pedicle screws: left hemilaminectomy open reduction fracture fragment (Left) LUMBAR PERCUTANEOUS PEDICLE SCREW 4 LEVEL.  She is from home alone.                 Action/Plan: Awaiting PT/OT recommendations. CM following for d/c needs.   Expected Discharge Date:                  Expected Discharge Plan:     In-House Referral:     Discharge planning Services     Post Acute Care Choice:    Choice offered to:     DME Arranged:    DME Agency:     HH Arranged:    HH Agency:     Status of Service:  In process, will continue to follow  If discussed at Long Length of Stay Meetings, dates discussed:    Additional Comments:  Pollie Friar, RN 05/04/2016, 12:08 PM

## 2016-05-04 NOTE — Progress Notes (Signed)
No acute events Transient hypotension when up with therapy; probably lingering anesthesia Moving legs well Incisions look good Continue therapy Dispo planning

## 2016-05-04 NOTE — Progress Notes (Signed)
Therapy reported that pt sat up in a chair but her BP dropped so she was put back to bed. Therapist notified Md.

## 2016-05-05 NOTE — Progress Notes (Signed)
Physical Therapy Treatment Patient Details Name: [name] MRN: [id number] DOB: September 12, 1940 Today's Date: 05/05/2016    History of Present Illness 75 y.o. 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 s/p Lumbar three Corpectomy  with Lumbar one to lumbar five fixation fusion with left hemilaminectomy  open reduction fracture fragment (Left)    PT Comments    Limited by repeated urinary incontinence each time she stood (even after assisted to [facility] twice). Nursing unit without mesh briefs/pads. Patient to have her friend bring in her briefs. BP was not an issue with normal increase with incr activity.   Follow Up Recommendations  SNF;Supervision/Assistance - 24 hour     Equipment Recommendations  Rolling walker with 5" wheels;3in1 (PT)    Recommendations for Other Services       Precautions / Restrictions Precautions Precautions: Back;Fall Precaution Comments: verbal and demonstration review with pateint; she recalled 0/3 at beginning of session Required Braces or Orthoses: Spinal Brace Spinal Brace: Thoracolumbosacral orthotic;Applied in sitting position    Mobility  Bed Mobility Overal bed mobility: Needs Assistance Bed Mobility: Rolling;Sidelying to Sit Rolling: Min assist Sidelying to sit: Mod assist;+2 for physical assistance       General bed mobility comments: requires assist for all aspects of bed mobility, VCs for technique and positioning, increased time and effort to perform  Transfers Overall transfer level: Needs assistance Equipment used: Rolling walker (2 wheeled) Transfers: Sit to/from [facility] Sit to Stand: Min assist;+2 safety/equipment Stand pivot transfers: Min assist;+2 safety/equipment       General transfer comment: twice when pt stood (from bed and [facility]) she was incontinent of urine. Quickly assisted to [facility] each time, provided cleanup, change of gown (doff and  don brace). Pt reports she wears an absorbent brief normally for "loose bowels" not for urine  Ambulation/Gait Ambulation/Gait assistance: Min assist;+2 safety/equipment Ambulation Distance (Feet): 2 Feet (2, 5) Assistive device: Rolling walker (2 wheeled) Gait Pattern/deviations: Step-to pattern;Decreased stride length;Shuffle     General Gait Details: limited by incontinence   Stairs            Wheelchair Mobility    Modified Rankin (Stroke Patients Only)       Balance Overall balance assessment: Needs assistance Sitting-balance support: No upper extremity supported;Feet supported Sitting balance-Leahy Scale: Fair     Standing balance support: Bilateral upper extremity supported Standing balance-Leahy Scale: Poor                      Cognition Arousal/Alertness: Awake/alert Behavior During Therapy: Anxious Overall Cognitive Status: No family/caregiver present to determine baseline cognitive functioning       Memory: Decreased short-term memory (did not recall PT education of 9/1 (?meds))              Exercises      General Comments General comments (skin integrity, edema, etc.): Nursing unit did not have mesh briefs or pads to don for walking; friend to bring in pt's briefs      Pertinent Vitals/Pain Pain Assessment: Faces Faces Pain Scale: Hurts even more Pain Location: back and left leg Pain Descriptors / Indicators: Burning;Operative site guarding;Grimacing;Guarding Pain Intervention(s): Limited activity within patient's tolerance;Monitored during session;Premedicated before session;Repositioned    Home Living                      Prior Function  PT Goals (current goals can now be found in the care plan section) Acute Rehab PT Goals Patient Stated Goal: to not hurt Time For Goal Achievement: 05/18/16 Progress towards PT goals: Not progressing toward goals - comment (repeated incontinence)    Frequency  Min  5X/week    PT Plan Current plan remains appropriate    Co-evaluation             End of Session Equipment Utilized During Treatment: Gait belt;Back brace Activity Tolerance: Treatment limited secondary to medical complications (Comment) (urinary incontinence) Patient left: in chair;with call bell/phone within reach;with chair alarm set;with family/visitor present     Time: [id number] PT Time Calculation (min) (ACUTE ONLY): 36 min  Charges:  $Therapeutic Activity: 23-37 mins                    G Codes:      Telsa Dillavou Jun 01, 2016, 11:26 AM Pager [phone]

## 2016-05-05 NOTE — Progress Notes (Signed)
No acute events AVSS Moving legs well Stable Dispo planning

## 2016-05-06 NOTE — Progress Notes (Signed)
No acute events Moving legs well Incisions look good Stable Dispo planning

## 2016-05-07 MED ORDER — METHOCARBAMOL 750 MG PO TABS: 750.0000 mg | ORAL_TABLET | Freq: Four times a day (QID) | ORAL | 2 refills | Status: DC | PRN

## 2016-05-07 MED ORDER — GABAPENTIN 300 MG PO CAPS: 300.0000 mg | ORAL_CAPSULE | Freq: Three times a day (TID) | ORAL | 2 refills | Status: DC

## 2016-05-07 MED ORDER — HYDROCODONE-ACETAMINOPHEN 7.5-325 MG PO TABS: 1.0000 | ORAL_TABLET | ORAL | 0 refills | Status: DC | PRN

## 2016-05-07 MED ORDER — DOCUSATE SODIUM 100 MG PO CAPS: 100.0000 mg | ORAL_CAPSULE | Freq: Two times a day (BID) | ORAL | 0 refills | Status: DC

## 2016-05-07 NOTE — Progress Notes (Signed)
Physical Therapy Treatment Patient Details Name: [name] MRN: [id number] DOB: 1941/02/06 Today's Date: 05/07/2016    History of Present Illness 75 y.o. 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 s/p Lumbar three Corpectomy  with Lumbar one to lumbar five fixation fusion with left hemilaminectomy  open reduction fracture fragment (Left)    PT Comments    Patient is making progress toward mobility goals. Pt with decreased recall of precautions and fearful of falling. Current plan remains appropriate.   Follow Up Recommendations  SNF;Supervision/Assistance - 24 hour     Equipment Recommendations  Rolling walker with 5" wheels;3in1 (PT)    Recommendations for Other Services       Precautions / Restrictions Precautions Precautions: Back;Fall Precaution Comments: pt recalled 1/3 precautions without cues beginning of session; reviewed all precautions and pt has handout Required Braces or Orthoses: Spinal Brace Spinal Brace: Thoracolumbosacral orthotic;Applied in sitting position Restrictions Weight Bearing Restrictions: No    Mobility  Bed Mobility Overal bed mobility: Needs Assistance Bed Mobility: Sit to Sidelying;Rolling Rolling: Supervision Sidelying to sit: Min guard       General bed mobility comments: cues for sequencing and technique  Transfers Overall transfer level: Needs assistance Equipment used: Rolling walker (2 wheeled) Transfers: Sit to/from Stand Sit to Stand: Min guard         General transfer comment: min guard for safety; cues for hand placement; X1 from recliner and X1 from [facility]  Ambulation/Gait Ambulation/Gait assistance: Min guard Ambulation Distance (Feet): 100 Feet Assistive device: Rolling walker (2 wheeled) Gait Pattern/deviations: Step-through pattern;Decreased stride length     General Gait Details: cues for posture, increased bilat step length and for breathing as  pt is fearful of falling and has tendency to take short labored breaths while ambulating   Stairs            Wheelchair Mobility    Modified Rankin (Stroke Patients Only)       Balance     Sitting balance-Leahy Scale: Fair     Standing balance support: Single extremity supported Standing balance-Leahy Scale: Fair Standing balance comment: single UE supprt static standing and bilat UE support for dynamic                    Cognition Arousal/Alertness: Awake/alert Behavior During Therapy: Anxious Overall Cognitive Status: No family/caregiver present to determine baseline cognitive functioning       Memory: Decreased short-term memory (did not recall PT education of 9/1 (?meds))              Exercises      General Comments General comments (skin integrity, edema, etc.): good idea to use BSC before ambulating       Pertinent Vitals/Pain Pain Assessment: 0-10 Pain Score: 4  Pain Location: back Pain Descriptors / Indicators: Operative site guarding;Sore Pain Intervention(s): Limited activity within patient's tolerance;Monitored during session;Premedicated before session;Repositioned    Home Living                      Prior Function            PT Goals (current goals can now be found in the care plan section) Acute Rehab PT Goals Patient Stated Goal: go home Time For Goal Achievement: 05/18/16 Progress towards PT goals: Progressing toward goals    Frequency  Min 5X/week    PT Plan Current plan remains appropriate    Co-evaluation  End of Session Equipment Utilized During Treatment: Gait belt;Back brace Activity Tolerance: Patient tolerated treatment well Patient left: with call bell/phone within reach;in bed;with bed alarm set;with SCD's reapplied     Time: 1005-1035 PT Time Calculation (min) (ACUTE ONLY): 30 min  Charges:  $Gait Training: 8-22 mins $Therapeutic Activity: 8-22 mins                    G  Codes:      Salina April, PTA Pager: [phone]   05/07/2016, 11:52 AM

## 2016-05-07 NOTE — Discharge Summary (Signed)
Date of Admission: 05/03/2016  Date of Discharge: 05/07/16  Admission Diagnosis: L3 burst fracture   Discharge Diagnosis: Same  Procedure Performed: L3 corpectomy, L1-5 fixation and fusion.  Attending: Kevan Ny Avery Klingbeil, MD  Hospital Course:  The patient was admitted for the above listed operation and had an uncomplicated post-operative course.  They were discharged in stable condition.  Follow up: 3 weeks    Medication List    TAKE these medications   acetaminophen 500 MG tablet Commonly known as:  TYLENOL Take 500-1,000 mg by mouth every 8 (eight) hours as needed for mild pain or moderate pain.   calcium carbonate 500 MG chewable tablet Commonly known as:  TUMS - dosed in mg elemental calcium Chew 1 tablet by mouth as needed.   CALTRATE 600 PO Take 1 tablet by mouth 3 (three) times daily.   cholecalciferol 1000 units tablet Commonly known as:  VITAMIN D Take 1,000 Units by mouth daily.   CRESTOR 40 MG tablet Generic drug:  rosuvastatin Take 20 mg by mouth daily.   DAILY VALUE MULTIVITAMIN PO Take 1 tablet by mouth daily.   docusate sodium 100 MG capsule Commonly known as:  COLACE Take 1 capsule (100 mg total) by mouth 2 (two) times daily.   esomeprazole 20 MG capsule Commonly known as:  NEXIUM Take 20 mg by mouth daily as needed.   gabapentin 300 MG capsule Commonly known as:  NEURONTIN Take 1 capsule (300 mg total) by mouth 3 (three) times daily.   glucosamine-chondroitin 500-400 MG tablet Take 1 tablet by mouth daily.   HYDROcodone-acetaminophen 7.5-325 MG tablet Commonly known as:  NORCO Take 1-2 tablets by mouth every 4 (four) hours as needed for moderate pain.   methocarbamol 750 MG tablet Commonly known as:  ROBAXIN Take 1 tablet (750 mg total) by mouth every 6 (six) hours as needed for muscle spasms.   propranolol ER 80 MG 24 hr capsule Commonly known as:  INDERAL LA Take 80 mg by mouth daily.   psyllium 58.6 % powder Commonly known  as:  METAMUCIL Take 1 packet by mouth daily as needed.   vitamin C 500 MG tablet Commonly known as:  ASCORBIC ACID Take 500 mg by mouth daily.   vitamin E 400 UNIT capsule Generic drug:  vitamin E Take 400 Units by mouth daily.   zinc gluconate 50 MG tablet Take 50 mg by mouth daily.

## 2016-05-07 NOTE — Care Management Important Message (Signed)
Important Message  Patient Details  Name: ALEANE PERINI MRN: [id number] Date of Birth: October 08, 1940   Medicare Important Message Given:  Yes    Talitha Dicarlo 05/07/2016, 9:22 AM

## 2016-05-07 NOTE — NC FL2 (Signed)
[place] MEDICAID FL2 LEVEL OF CARE SCREENING TOOL     IDENTIFICATION  Patient Name: [name] Birthdate: 1941-03-24 Sex: female Admission Date (Current Location): 05/03/2016  [facility] and [place] Number:  Herbalist and Address:  [facility] [place], [place] [address] [place], [place] [address]      Provider Number: [id number]  Attending Physician Name and Address:  Kevan Ny Ditty, MD  Relative Name and Phone Number:       Current Level of Care: Hospital Recommended Level of Care: [place] Prior Approval Number:    Date Approved/Denied:   PASRR Number: [id number]  Discharge Plan: SNF    Current Diagnoses: Patient Active Problem List   Diagnosis Date Noted  . Burst fracture of lumbar vertebra ([place]) 05/03/2016  . S/P lumbar fusion 05/03/2016  . Endometrial cancer ([place])   . 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 Malignant neoplasm of corpus uteri, except isthmus ([place]) 07/23/2011    Orientation RESPIRATION BLADDER Height & Weight     Self, Time, Situation, Place  Normal Continent Weight: 83 kg (183 lb) Height:  5\' 6"  (167.6 cm)  BEHAVIORAL SYMPTOMS/MOOD NEUROLOGICAL BOWEL NUTRITION STATUS      Continent Diet (Please see DC Summary)  AMBULATORY STATUS COMMUNICATION OF NEEDS Skin   Limited Assist Verbally Surgical wounds (Closed incision on flank and back)                       Personal Care Assistance Level of Assistance  Bathing, Feeding, Dressing Bathing Assistance: Independent Feeding assistance: Independent Dressing Assistance: Independent     Functional Limitations Info             SPECIAL CARE FACTORS FREQUENCY  PT (By licensed PT), OT (By licensed OT)     PT Frequency: 5x/week OT Frequency: 3x/week            Contractures      Additional Factors Info  Code Status, Allergies Code Status Info: Full Allergies Info: NKA            Current Medications (05/07/2016):  This is the current hospital active medication list Current Facility-Administered Medications  Medication Dose Route Frequency Provider Last Rate Last Dose  . 0.9 %  sodium chloride infusion   Intravenous Continuous Kevan Ny Ditty, MD 100 mL/hr at 05/04/16 1000    . 0.9 %  sodium chloride infusion  250 mL Intravenous Continuous Kevan Ny Ditty, MD      . acetaminophen (TYLENOL) tablet 1,000 mg  1,000 mg Oral Q6H Kevan Ny Ditty, MD   1,000 mg at 05/07/16 0610  . acetaminophen (TYLENOL) tablet 500-1,000 mg  500-1,000 mg Oral Q8H PRN Kevan Ny Ditty, MD   1,000 mg at 05/06/16 2217  . bisacodyl (DULCOLAX) EC tablet 5 mg  5 mg Oral Daily PRN Kevan Ny Ditty, MD      . calcium carbonate (OS-CAL - dosed in mg of elemental calcium) tablet 500 mg of elemental calcium  1 tablet Oral TID WC Kevan Ny Ditty, MD   500 mg of elemental calcium at 05/07/16 0824  . calcium carbonate (TUMS - dosed in mg elemental calcium) chewable tablet 200 mg of elemental calcium  1 tablet Oral PRN Kevan Ny Ditty, MD      . celecoxib (CELEBREX) capsule 200 mg  200 mg Oral BID Kevan Ny Ditty, MD   200 mg at 05/07/16 0824  .  Chlorhexidine Gluconate Cloth 2 % PADS 6 each  6 each Topical Daily Kevan Ny Ditty, MD   6 each at 05/04/16 1002  . cholecalciferol (VITAMIN D) tablet 1,000 Units  1,000 Units Oral Daily Kevan Ny Ditty, MD   1,000 Units at 05/07/16 [phone]  . docusate sodium (COLACE) capsule 100 mg  100 mg Oral BID Kevan Ny Ditty, MD   100 mg at 05/07/16 [id number]  . gabapentin (NEURONTIN) capsule 300 mg  300 mg Oral TID Kevan Ny Ditty, MD   300 mg at 05/07/16 0825  . menthol-cetylpyridinium (CEPACOL) lozenge 3 mg  1 lozenge Oral PRN Kevan Ny Ditty, MD       Or  . phenol (CHLORASEPTIC) mouth spray 1 spray  1 spray Mouth/Throat PRN Kevan Ny Ditty, MD      . methocarbamol (ROBAXIN) tablet 750 mg  750 mg Oral Q6H PRN  Kevan Ny Ditty, MD   750 mg at 05/07/16 0355  . ondansetron (ZOFRAN) injection 4 mg  4 mg Intravenous Q4H PRN Kevan Ny Ditty, MD   4 mg at 05/03/16 2156  . oxyCODONE (Oxy IR/ROXICODONE) immediate release tablet 5-10 mg  5-10 mg Oral Q3H PRN Kevan Ny Ditty, MD   10 mg at 05/07/16 0823  . pantoprazole (PROTONIX) EC tablet 20 mg  20 mg Oral Daily Kevan Ny Ditty, MD   20 mg at 05/07/16 0826  . propranolol ER (INDERAL LA) 24 hr capsule 80 mg  80 mg Oral Once Jillyn Hidden, MD      . propranolol ER (INDERAL LA) 24 hr capsule 80 mg  80 mg Oral Daily Kevan Ny Ditty, MD   80 mg at 05/07/16 0824  . rosuvastatin (CRESTOR) tablet 20 mg  20 mg Oral Daily Kevan Ny Ditty, MD   20 mg at 05/07/16 0826  . senna (SENOKOT) tablet 8.6 mg  1 tablet Oral BID Kevan Ny Ditty, MD   8.6 mg at 05/07/16 0825  . sodium chloride flush (NS) 0.9 % injection 3 mL  3 mL Intravenous Q12H Kevan Ny Ditty, MD   3 mL at 05/06/16 2233  . sodium chloride flush (NS) 0.9 % injection 3 mL  3 mL Intravenous PRN Kevan Ny Ditty, MD      . vitamin C (ASCORBIC ACID) tablet 500 mg  500 mg Oral Daily Kevan Ny Ditty, MD   500 mg at 05/07/16 0824  . vitamin E capsule 400 Units  400 Units Oral Daily Kevan Ny Ditty, MD   400 Units at 05/07/16 [phone]  . zinc sulfate capsule 220 mg  220 mg Oral Daily Kevan Ny Ditty, MD   220 mg at 05/07/16 0825  . zolpidem (AMBIEN) tablet 5 mg  5 mg Oral QHS PRN Tamala Fothergill, MD         Discharge Medications: Please see discharge summary for a list of discharge medications.  Relevant Imaging Results:  Relevant Lab Results:   Additional Information SSN: [place], [place]

## 2016-05-07 NOTE — Progress Notes (Signed)
Patient is able to discharge to [facility] when medically ready.  Percell Locus Bartholomew Ramesh LCSWA [phone]

## 2016-05-07 NOTE — Clinical Social Work Note (Signed)
Clinical Social Work Assessment  Patient Details  Name: [name] MRN: [id number] Date of Birth: Oct 13, 1940  Date of referral:  05/07/16               Reason for consult:  Facility Placement                Permission sought to share information with:  Facility Sport and exercise psychologist, Family Supports Permission granted to share information::  Yes, Verbal Permission Granted  Name::        Agency::  SNFs  Relationship::     Contact Information:     Housing/Transportation Living arrangements for the past 2 months:  Single Family Home Source of Information:  Parent Patient Interpreter Needed:  None Criminal Activity/Legal Involvement Pertinent to Current Situation/Hospitalization:  No - Comment as needed Significant Relationships:  Siblings Lives with:  Self Do you feel safe going back to the place where you live?  No Need for family participation in patient care:  No (Coment)  Care giving concerns:  CSW received referral for possible SNF placement at time of discharge. CSW met with patient regarding PT recommendation of SNF placement at time of discharge. Patient stated that she lives alone and is currently unable to care for herself. Patient expressed understanding of PT recommendation and is agreeable to SNF placement at time of discharge. CSW to continue to follow and assist with discharge planning needs.   Social Worker assessment / plan:  CSW spoke with patient concerning possibility of rehab at [facility] before returning home.  Employment status:  Retired Nurse, adult PT Recommendations:  [place] / Referral to community resources:  [place]  Patient/Family's Response to care:  Patient recognizes need for rehab before returning home and is agreeable to a SNF in [place]. Patient reported preference for [facility] or [facility].  Patient/Family's Understanding of and Emotional Response to Diagnosis, Current  Treatment, and Prognosis:  Patient/family is realistic regarding therapy needs and expressed being hopeful for SNF placement. No questions/concerns about plan or treatment.    Emotional Assessment Appearance:  Appears stated age Attitude/Demeanor/Rapport:  Other (Appropriate) Affect (typically observed):  Accepting, Appropriate, Pleasant Orientation:  Oriented to Self, Oriented to Place, Oriented to  Time, Oriented to Situation Alcohol / Substance use:  Not Applicable Psych involvement (Current and /or in the community):  No (Comment)  Discharge Needs  Concerns to be addressed:  Care Coordination Readmission within the last 30 days:  No Current discharge risk:  None Barriers to Discharge:  Continued Medical Work up   [employer], [place] 05/07/2016, 10:15 AM

## 2016-05-07 NOTE — Care Management Note (Signed)
Case Management Note  Patient Details  Name: [name] MRN: [id number] Date of Birth: [birth year]  Subjective/Objective:                    Action/Plan: Plan is for patient to discharge to [facility] when medically ready. CM following for d/c needs.   Expected Discharge Date:                  Expected Discharge Plan:     In-House Referral:     Discharge planning Services     Post Acute Care Choice:    Choice offered to:     DME Arranged:    DME Agency:     HH Arranged:    HH Agency:     Status of Service:  In process, will continue to follow  If discussed at Long Length of Stay Meetings, dates discussed:    Additional Comments:  Pollie Friar, RN 05/07/2016, 3:55 PM

## 2016-05-07 NOTE — Progress Notes (Addendum)
No acute events Awake and alert Moving legs well Ready for D/c tomorrow

## 2016-05-08 ENCOUNTER — Encounter (HOSPITAL_COMMUNITY): Payer: Self-pay | Admitting: Neurological Surgery

## 2016-05-08 NOTE — Progress Notes (Signed)
Physical Therapy Treatment Patient Details Name: [name] MRN: [id number] DOB: 30-Dec-1940 Today's Date: 05/08/2016    History of Present Illness 75 y.o. 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 s/p Lumbar three Corpectomy  with Lumbar one to lumbar five fixation fusion with left hemilaminectomy  open reduction fracture fragment (Left)    PT Comments    Patient continues to make progress toward mobility goals and demonstrated improved activity tolerance this session. Pt does continue to demo short term memory deficits and required education on back precautions and use of brace during session. Current plan remains appropriate.   Follow Up Recommendations  SNF;Supervision/Assistance - 24 hour     Equipment Recommendations  Rolling walker with 5" wheels;3in1 (PT)    Recommendations for Other Services       Precautions / Restrictions Precautions Precautions: Back;Fall Precaution Comments: recalled 2/3 precautions Required Braces or Orthoses: Spinal Brace Spinal Brace: Thoracolumbosacral orthotic;Applied in sitting position Restrictions Weight Bearing Restrictions: No    Mobility  Bed Mobility Overal bed mobility: Needs Assistance Bed Mobility: Rolling;Sidelying to Sit Rolling: Supervision Sidelying to sit: Min guard       General bed mobility comments: cues for sequencing  Transfers Overall transfer level: Needs assistance Equipment used: Rolling walker (2 wheeled) Transfers: Sit to/from [facility] Sit to Stand: Supervision Stand pivot transfers: Min guard       General transfer comment: supervision to stand from EOB and BSC; min guard for stand pivot to [facility] with HHA; cues for safe hand placement and technique  Ambulation/Gait Ambulation/Gait assistance: Supervision Ambulation Distance (Feet): 150 Feet Assistive device: Rolling walker (2 wheeled) Gait Pattern/deviations:  Step-through pattern;Decreased stride length;Trunk flexed     General Gait Details: cues for posture; improved activity tolerance; unsteady with single UE support     Stairs            Wheelchair Mobility    Modified Rankin (Stroke Patients Only)       Balance     Sitting balance-Leahy Scale: Fair       Standing balance-Leahy Scale: Poor                      Cognition Arousal/Alertness: Awake/alert Behavior During Therapy: Anxious Overall Cognitive Status: No family/caregiver present to determine baseline cognitive functioning       Memory: Decreased recall of precautions;Decreased short-term memory         General Comments: pt continues to have poor recall of precautions and use of brace    Exercises      General Comments        Pertinent Vitals/Pain Pain Assessment: Faces Faces Pain Scale: Hurts even more Pain Location: back and L LE Pain Descriptors / Indicators: Grimacing;Guarding;Numbness;Sore Pain Intervention(s): Limited activity within patient's tolerance;Monitored during session;Premedicated before session;Repositioned    Home Living                      Prior Function            PT Goals (current goals can now be found in the care plan section) Acute Rehab PT Goals Patient Stated Goal: go home Time For Goal Achievement: 05/18/16 Progress towards PT goals: Progressing toward goals    Frequency  Min 5X/week    PT Plan Current plan remains appropriate    Co-evaluation             End of Session  Equipment Utilized During Treatment: Gait belt;Back brace Activity Tolerance: Patient tolerated treatment well Patient left: with call bell/phone within reach;in chair     Time: [id number] PT Time Calculation (min) (ACUTE ONLY): 27 min  Charges:  $Gait Training: 8-22 mins $Therapeutic Activity: 8-22 mins                    G Codes:      [place] April, PTA Pager: [phone]   05/08/2016, 3:47 PM

## 2016-05-08 NOTE — Progress Notes (Signed)
Occupational Therapy Treatment Patient Details Name: [name] MRN: [id number] DOB: 1941/01/04 Today's Date: 05/08/2016    History of present illness 75 y.o. 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 s/p Lumbar three Corpectomy  with Lumbar one to lumbar five fixation fusion with left hemilaminectomy  open reduction fracture fragment (Left)   OT comments  Pt currently with pain and return to supine at the end of session. Pt able to transfer to the bathroom this session and complete bed mobility. Pt progressing toward goals but limited by pain and nausea.    Follow Up Recommendations  SNF;Supervision/Assistance - 24 hour    Equipment Recommendations  3 in 1 bedside comode;Tub/shower bench    Recommendations for Other Services      Precautions / Restrictions Precautions Precautions: Back;Fall Precaution Comments: poor recall of precautions.  Required Braces or Orthoses: Spinal Brace Spinal Brace: Thoracolumbosacral orthotic;Applied in sitting position Restrictions Weight Bearing Restrictions: No       Mobility Bed Mobility Overal bed mobility: Needs Assistance Bed Mobility: Sit to Supine Rolling: Supervision Sidelying to sit: Supervision       General bed mobility comments: cues for safety and sequence  Transfers Overall transfer level: Needs assistance   Transfers: Sit to/from Stand Sit to Stand: Min assist         General transfer comment: pt requires (A) to power up from recliner but able to power up from 3n1 min guard    Balance Overall balance assessment: Needs assistance Sitting-balance support: Bilateral upper extremity supported;Feet supported Sitting balance-Leahy Scale: Fair     Standing balance support: Bilateral upper extremity supported;During functional activity Standing balance-Leahy Scale: Poor                     ADL Overall ADL's : Needs  assistance/impaired     Grooming: Wash/dry hands;Minimal assistance;Standing Grooming Details (indicate cue type and reason): heavy leaning on bil UE          Upper Body Dressing : Total assistance;Sitting Upper Body Dressing Details (indicate cue type and reason): total (A) to don TLSO and doff   Lower Body Dressing Details (indicate cue type and reason): pt was in too much pain to educate on AE at this time.  Toilet Transfer: Minimal Interior and spatial designer Details (indicate cue type and reason): cues for safety and hand placement Toileting- Clothing Manipulation and Hygiene: Total assistance Toileting - Clothing Manipulation Details (indicate cue type and reason): static standing for peri care     Functional mobility during ADLs: Minimal assistance General ADL Comments: Pt in chair and in pain on arrival. Pt assisted to the bathroom and then to bed. Pt nauseate once at bed level but resolved. pt return to supine. OT called RN to room to address wound dressing prior to positioning.       Vision                     Perception     Praxis      Cognition   Behavior During Therapy: [facility] for tasks assessed/performed Overall Cognitive Status: Impaired/Different from baseline Area of Impairment: Memory     Memory: Decreased short-term memory          General Comments: pt up in chair without brace and states she didnt know she was suppose to wear it.     Extremity/Trunk Assessment  Exercises     Shoulder Instructions       General Comments      Pertinent Vitals/ Pain       Pain Assessment: Faces Faces Pain Scale: Hurts worst Pain Location: back Pain Descriptors / Indicators: Operative site guarding Pain Intervention(s): Monitored during session;Repositioned  Home Living                                          Prior Functioning/Environment              Frequency Min 2X/week     Progress Toward  Goals  OT Goals(current goals can now be found in the care plan section)  Progress towards OT goals: Progressing toward goals  Acute Rehab OT Goals Patient Stated Goal: go home OT Goal Formulation: With patient Time For Goal Achievement: 05/18/16 Potential to Achieve Goals: Good ADL Goals Pt Will Perform Lower Body Bathing: with min assist;with adaptive equipment;sit to/from stand Pt Will Perform Lower Body Dressing: with min guard assist;sit to/from stand;with adaptive equipment Pt Will Transfer to Toilet: with supervision;ambulating;bedside commode Pt Will Perform Toileting - Clothing Manipulation and hygiene: with min assist;sitting/lateral leans;sit to/from stand;with adaptive equipment  Plan Discharge plan remains appropriate    Co-evaluation                 End of Session Equipment Utilized During Treatment: Gait belt;Back brace;Rolling walker   Activity Tolerance Patient tolerated treatment well   Patient Left in bed;with call bell/phone within reach;with bed alarm set;with nursing/sitter in room   Nurse Communication Mobility status;Precautions        Time: [phone] OT Time Calculation (min): 19 min  Charges: OT General Charges $OT Visit: 1 Procedure OT Treatments $Self Care/Home Management : 8-22 mins  Parke Poisson B 05/08/2016, 11:19 AM   Jeri Modena   OTR/L Pager: [phone] Office: [phone] .

## 2016-05-08 NOTE — Progress Notes (Signed)
No acute events Doing well discharge

## 2016-05-08 NOTE — Care Management Note (Signed)
Case Management Note  Patient Details  Name: [name] MRN: [id number] Date of Birth: Dec 23, 1940  Subjective/Objective:                    Action/Plan: Pt discharging to [facility] SNF today. No further needs per CM.  Expected Discharge Date:                  Expected Discharge Plan:  Skilled Nursing Facility  In-House Referral:  Clinical Social Work  Discharge planning Services  CM Consult  Post Acute Care Choice:    Choice offered to:     DME Arranged:    DME Agency:     HH Arranged:    [place] Agency:     Status of Service:  Completed, signed off  If discussed at [employer], dates discussed:    Additional Comments:  Pollie Friar, RN 05/08/2016, 1:46 PM

## 2016-05-08 NOTE — Clinical Social Work Placement (Signed)
   CLINICAL SOCIAL WORK PLACEMENT  NOTE 05/08/16 - DISCHARGED TO [facility]  Date:  05/08/2016  Patient Details  Name: [name] MRN: [id number] Date of Birth: 1940-12-20  Clinical Social Work is seeking post-discharge placement for this patient at the [place] level of care (*CSW will initial, date and re-position this form in  chart as items are completed):      Patient/family provided with [place] Work Department's list of facilities offering this level of care within the geographic area requested by the patient (or if unable, by the patient's family).      Patient/family informed of their freedom to choose among providers that offer the needed level of care, that participate in Medicare, Medicaid or managed care program needed by the patient, have an available bed and are willing to accept the patient.      Patient/family informed of [facility]'s ownership interest in [facility] and [facility], as well as of the fact that they are under no obligation to receive care at these facilities.  PASRR submitted to EDS on 05/07/16     PASRR number received on 05/07/16     Existing PASRR number confirmed on       FL2 transmitted to all facilities in geographic area requested by pt/family on 05/07/16     FL2 transmitted to all facilities within larger geographic area on 05/07/16     Patient informed that his/her managed care company has contracts with or will negotiate with certain facilities, including the following:         YES - Patient/family informed of bed offers received.  Patient chooses bed at   [facility]     Physician recommends and patient chooses bed at      Patient to be transferred to  [facility] on  05/08/16.  Patient to be transferred to facility by  ambulance     Patient family notified on  05/08/16 of transfer.  Name of family member notified:   Marvis Gaton [phone].     PHYSICIAN Please sign FL2      Additional Comment:    _______________________________________________ Sable Feil, LCSW 05/08/2016, 1:49 PM

## 2016-05-09 ENCOUNTER — Non-Acute Institutional Stay (SKILLED_NURSING_FACILITY): Payer: Medicare Other | Admitting: Internal Medicine

## 2016-05-09 ENCOUNTER — Encounter: Payer: Self-pay | Admitting: Internal Medicine

## 2016-05-09 DIAGNOSIS — S32001S Stable burst fracture of unspecified lumbar vertebra, sequela: Secondary | ICD-10-CM | POA: Diagnosis not present

## 2016-05-09 DIAGNOSIS — E785 Hyperlipidemia, unspecified: Secondary | ICD-10-CM | POA: Diagnosis not present

## 2016-05-09 DIAGNOSIS — R682 Dry mouth, unspecified: Secondary | ICD-10-CM | POA: Diagnosis not present

## 2016-05-09 DIAGNOSIS — R5381 Other malaise: Secondary | ICD-10-CM

## 2016-05-09 DIAGNOSIS — R251 Tremor, unspecified: Secondary | ICD-10-CM

## 2016-05-09 DIAGNOSIS — M792 Neuralgia and neuritis, unspecified: Secondary | ICD-10-CM

## 2016-05-09 DIAGNOSIS — D62 Acute posthemorrhagic anemia: Secondary | ICD-10-CM

## 2016-05-09 DIAGNOSIS — K5900 Constipation, unspecified: Secondary | ICD-10-CM | POA: Diagnosis not present

## 2016-05-09 DIAGNOSIS — K219 Gastro-esophageal reflux disease without esophagitis: Secondary | ICD-10-CM

## 2016-05-09 NOTE — Progress Notes (Signed)
LOCATION: [facility]  PCP: [name], MD   Code Status: Full Code  Goals of care: Advanced Directive information Advanced Directives 05/03/2016  Does patient have an advance directive? Yes  Does patient want to make changes to advanced directive? No - Patient declined  Copy of advanced directive(s) in chart? -  Would patient like information on creating an advanced directive? -       Extended Emergency Contact Information Primary Emergency Contact: Larin, Gravett Address: [place], [place] Phone: [phone] Relation: Brother   No Known Allergies  Chief Complaint  Patient presents with  . New Admit To SNF    New Admission     HPI:  Patient is a 75 y.o. female seen today for short term rehabilitation post hospital admission from 05/03/16-05/07/16 with L3 burst fracture. She underwent L3 corpectomy with L1-5 fixation and fusion. She is seen in her room today.   Review of Systems:  Constitutional: Negative for fever, chills. Energy level is fair. HENT: Negative for congestion, nasal discharge, difficulty swallowing. Positive for occasional headaches and dry mouth.  Eyes: Negative for blurred vision, double vision and discharge. Wears glasses. Respiratory: Negative for cough, shortness of breath and wheezing.   Cardiovascular: Negative for chest pain, palpitations, leg swelling.  Gastrointestinal: Negative for heartburn, nausea, vomiting, abdominal pain. Last bowel movement was this morning.  Genitourinary: Negative for dysuria and flank pain.  Musculoskeletal: Negative for fall in the facility.  Skin: Negative for itching, rash.  Neurological: Positive for occasional dizziness. Psychiatric/Behavioral: Negative for depression    Past Medical History:  Diagnosis Date  . Anxiety   . Arthritis   . Endometrial cancer ([place]) dx'd 01/2008   recurrences 08/2011; 01/2012; xrt comp 03/2012  . GERD  (gastroesophageal reflux disease)   . Hyperlipidemia   . Hypertension   . Migraines   . Radiation 10/2011   External Beam/Intracavitary  . S/P radiation therapy 01/29/12 - 03/10/12   Periaortic area/ 5600 cgy/ 28 Fractions   Past Surgical History:  Procedure Laterality Date  . ANTERIOR LATERAL LUMBAR FUSION 4 LEVELS Left 05/03/2016   Procedure: Lumbar three Corpectomy  with Lumbar one to lumbar five fixation fusion with percutaneous pedicle screws: left hemilaminectomy open reduction fracture fragment;  Surgeon: Kevan Ny Ditty, MD;  Location: [facility];  Service: Neurosurgery;  Laterality: Left;  . CHOLECYSTECTOMY    . EYE SURGERY    . LUMBAR PERCUTANEOUS PEDICLE SCREW 4 LEVEL Bilateral 05/03/2016   Procedure: LUMBAR PERCUTANEOUS PEDICLE SCREW 4 LEVEL;  Surgeon: Kevan Ny Ditty, MD;  Location: [facility];  Service: Neurosurgery;  Laterality: Bilateral;  . TOTAL ABDOMINAL HYSTERECTOMY W/ BILATERAL SALPINGOOPHORECTOMY  2009   Social History:   reports that she has never smoked. She has never used smokeless tobacco. She reports that she does not drink alcohol or use drugs.  Family History  Problem Relation Age of Onset  . Heart failure Mother   . Ulcers Father     Medications:   Medication List       Accurate as of 05/09/16  3:56 PM. Always use your most recent med list.          acetaminophen 500 MG tablet Commonly known as:  TYLENOL Take 500-1,000 mg by mouth every 8 (eight) hours as needed for mild pain or moderate pain.   calcium carbonate 500 MG chewable tablet Commonly known as:  TUMS -  dosed in mg elemental calcium Chew 1 tablet by mouth every 8 (eight) hours as needed for heartburn.   CALTRATE 600 PO Take 1 tablet by mouth 3 (three) times daily.   cholecalciferol 1000 units tablet Commonly known as:  VITAMIN D Take 1,000 Units by mouth daily.   DAILY VALUE MULTIVITAMIN PO Take 1 tablet by mouth daily.   docusate sodium 100 MG capsule Commonly  known as:  COLACE Take 1 capsule (100 mg total) by mouth 2 (two) times daily.   esomeprazole 20 MG capsule Commonly known as:  NEXIUM Take 20 mg by mouth daily as needed.   gabapentin 300 MG capsule Commonly known as:  NEURONTIN Take 1 capsule (300 mg total) by mouth 3 (three) times daily.   glucosamine-chondroitin 500-400 MG tablet Take 1 tablet by mouth daily.   HYDROcodone-acetaminophen 7.5-325 MG tablet Commonly known as:  NORCO Take 1-2 tablets by mouth every 4 (four) hours as needed for moderate pain.   methocarbamol 750 MG tablet Commonly known as:  ROBAXIN Take 1 tablet (750 mg total) by mouth every 6 (six) hours as needed for muscle spasms.   propranolol ER 80 MG 24 hr capsule Commonly known as:  INDERAL LA Take 80 mg by mouth daily.   psyllium 58.6 % powder Commonly known as:  METAMUCIL Take 1 packet by mouth daily as needed.   rosuvastatin 20 MG tablet Commonly known as:  CRESTOR Take 20 mg by mouth daily.   vitamin C 500 MG tablet Commonly known as:  ASCORBIC ACID Take 500 mg by mouth daily.   vitamin E 400 UNIT capsule Generic drug:  vitamin E Take 400 Units by mouth daily.   zinc gluconate 50 MG tablet Take 50 mg by mouth daily.       Immunizations:  There is no immunization history on file for this patient.   Physical Exam:  Vitals:   05/09/16 1506  BP: (!) 148/66  Pulse: 70  Resp: 16  Temp: 98.8 F (37.1 C)  TempSrc: Oral  SpO2: 98%  Weight: 183 lb (83 kg)  Height: 5\' 6"  (1.676 m)   Body mass index is 29.54 kg/m.  General- elderly female, overweight, in no acute distress Head- normocephalic, atraumatic Nose- no nasal discharge Throat- dry mucus membrane  Eyes- PERRLA, EOMI, no pallor, no icterus, no discharge, normal conjunctiva, normal sclera Neck- no cervical lymphadenopathy Cardiovascular- normal s1,s2, no murmur, no leg edema Respiratory- bilateral clear to auscultation, no wheeze, no rhonchi, no crackles, no use of  accessory muscles Abdomen- bowel sounds present, soft, non tender Musculoskeletal- able to move all 4 extremities, generalized weakness Neurological- alert and oriented to person, place and time Skin- warm and dry, surgical incision to her lumbar region with dressing in place Psychiatry- normal mood and affect    Labs reviewed: Basic Metabolic Panel:  Recent Labs  07/29/15 1013 04/30/16 1025 05/04/16 0309  NA  --  141 143  K  --  3.7 3.5  CL  --  106 108  CO2  --  25 25  GLUCOSE  --  105* 150*  BUN 15.6 10 7   CREATININE 0.8 0.64 0.74  CALCIUM  --  10.2 8.6*   Liver Function Tests: No results for input(s): AST, ALT, ALKPHOS, BILITOT, PROT, ALBUMIN in the last 8760 hours. No results for input(s): LIPASE, AMYLASE in the last 8760 hours. No results for input(s): AMMONIA in the last 8760 hours. CBC:  Recent Labs  04/30/16 1025 05/04/16 0309  WBC  10.0 10.0  HGB 14.3 10.0*  HCT 43.8 32.2*  MCV 95.6 99.4  PLT 245 162   Cardiac Enzymes: No results for input(s): CKTOTAL, CKMB, CKMBINDEX, TROPONINI in the last 8760 hours. BNP: Invalid input(s): POCBNP CBG: No results for input(s): GLUCAP in the last 8760 hours.  Radiological Exams: Dg Lumbar Spine 2-3 Views  Result Date: 05/03/2016 CLINICAL DATA:  Imaging from L3 corpectomy with L1 through L5 posterior fusion. EXAM: DG C-ARM GT 120 MIN; LUMBAR SPINE - 2-3 VIEW FLUOROSCOPY TIME:  If the device does not provide the exposure index: Fluoroscopy Time (in minutes and seconds):  4 minutes and 59 seconds Number of Acquired Images:  6 COMPARISON:  MRI, 05/01/2016 FINDINGS: Images show placement of an intervertebral device which spans the disc level of L2-L3 to the upper endplate of L4, crossing the fractured L3 vertebrae. Subsequent imaging shows placement of pedicle screws interconnecting rods at L1, L2, L4 and L5. The orthopedic hardware appears well-seated and aligned. There is no evidence of an operative complication. IMPRESSION:  Operative imaging during and following L3 corpectomy with spine stabilization as detailed. Electronically Signed   By: Lajean Manes M.D.   On: 05/03/2016 15:51   Ct Lumbar Spine W Contrast  Result Date: 05/01/2016 CLINICAL DATA:  Painful vertebral body compression fracture. Symptoms for 3 weeks. History of uterine and vaginal cancer. EXAM: CT LUMBAR SPINE WITH CONTRAST TECHNIQUE: Multidetector CT imaging of the lumbar spine was performed with intravenous contrast administration. Multiplanar CT image reconstructions were also generated. CONTRAST:  Isovue 300, 75 mL COMPARISON:  MRI lumbar spine 04/19/2016. FINDINGS: Segmentation: Normal. Alignment:  Normal. Vertebrae: Severe compression fracture of L3, greater than 50% loss of vertebral body height. Large retropulsed fragment of bone into the canal, as assessed on axial and sagittal imaging, measures 18 x 10 x 17 mm. Severe spinal stenosis at this level.No visible deformity of the L2 vertebral body, despite mild edema on MR. No specific features which suggest a pathologic fracture of L3. No definite involvement of the L3 pedicles. Conus medullaris: Not assessed. Paraspinal tissues: No evidence for hydronephrosis or paravertebral mass. Disc levels: L1-L2:  Unremarkable. L2-L3: Severe stenosis deriving from retropulsed bone fragment is slightly greater below the interspace, worse on the LEFT. Posterior element hypertrophy is superimposed. L3-L4: Mild stenosis related annular bulging and posterior element hypertrophy. L4-L5:  Unremarkable disc space.  Posterior element hypertrophy. L5-S1:  Unremarkable disc space.  Posterior element hypertrophy. No abnormal postcontrast enhancement is evident. Compared with prior MR, similar degree of vertebral body compression and retropulsion. IMPRESSION: Severe acute compression fracture of L3, with significant retropulsion of bony fragment into the canal. Significant spinal stenosis at this level. No specific features which  suggest a pathologic fracture are evident. Electronically Signed   By: Staci Righter M.D.   On: 05/01/2016 10:29   Mr Lumbar Spine W Wo Contrast  Result Date: 05/01/2016 CLINICAL DATA:  Low back pain for approximately 6 weeks since being on the floor fixing a sink. History of prior carcinoma and radiation therapy. Subsequent encounter. EXAM: MRI LUMBAR SPINE WITHOUT AND WITH CONTRAST TECHNIQUE: Multiplanar and multiecho pulse sequences of the lumbar spine were obtained without and with intravenous contrast. CONTRAST:  15 ml MULTIHANCE GADOBENATE DIMEGLUMINE 529 MG/ML IV SOLN COMPARISON:  MRI lumbar spine 04/19/2016. CT lumbar spine 05/01/2016. FINDINGS: Segmentation:  Unremarkable. Alignment: Retropulsed bone off of the L3 vertebral body is identified and described below. Convex right scoliosis is noted. Vertebrae: Severe compression fracture deformity of L3 with areas  of up to 90% vertebral body height loss is again seen. Marrow edema is present throughout the vertebral body. Small focus of marrow edema in the anterior aspect of L2 appears slightly improved compared to the prior examination. The L1, L2, L4, L5 vertebra and sacrum demonstrate increased T1 signal consistent with prior radiation therapy. No evidence of metastatic disease is identified. Conus medullaris: Extends to the L1 level and appears normal. Paraspinal and other soft tissues: Unremarkable. Disc levels: T10-11 is imaged in the sagittal plane only and negative. T11-12: Right paracentral protrusion slightly deforms right hemicord. The appearance is not markedly changed. T12-L1: Shallow disc bulge without central canal or foraminal stenosis. L1-2:  Negative. L2-3: Large retropulsed fragment off the superior endplate of L3 centrally and slightly to the left is again seen and results in moderate to moderately severe central canal stenosis and some narrowing in the left lateral recess. No nerve root compression is identified. The appearance is  unchanged. L3-4: Shallow disc bulge is seen there is facet arthropathy. The central canal and foramina are open. L4-5: Facet degenerative change and a shallow disc bulge without central canal or foraminal stenosis, stable in appearance. L5-S1:  Negative. IMPRESSION: Severe, subacute compression fracture of L3. Retropulsed bone off the posterior margin of L3 causing moderate to moderately severe central canal stenosis and some narrowing in the left lateral recess is unchanged. Although the fracture cannot be definitively characterized, it has an appearance most compatible with a senile osteoporotic or posttraumatic injury. No evidence of metastatic disease is seen. Some improvement in marrow edema in the anterior, inferior aspect of L2 consistent with resolving contusion or microfracture. Post radiation changes in marrow of the lumbar spine. Electronically Signed   By: Inge Rise M.D.   On: 05/01/2016 11:41   Dg C-arm Gt 120 Min  Result Date: 05/03/2016 CLINICAL DATA:  Imaging from L3 corpectomy with L1 through L5 posterior fusion. EXAM: DG C-ARM GT 120 MIN; LUMBAR SPINE - 2-3 VIEW FLUOROSCOPY TIME:  If the device does not provide the exposure index: Fluoroscopy Time (in minutes and seconds):  4 minutes and 59 seconds Number of Acquired Images:  6 COMPARISON:  MRI, 05/01/2016 FINDINGS: Images show placement of an intervertebral device which spans the disc level of L2-L3 to the upper endplate of L4, crossing the fractured L3 vertebrae. Subsequent imaging shows placement of pedicle screws interconnecting rods at L1, L2, L4 and L5. The orthopedic hardware appears well-seated and aligned. There is no evidence of an operative complication. IMPRESSION: Operative imaging during and following L3 corpectomy with spine stabilization as detailed. Electronically Signed   By: Lajean Manes M.D.   On: 05/03/2016 15:51    Assessment/Plan  Physical deconditioning Will have her work with physical therapy and  occupational therapy team to help with gait training and muscle strengthening exercises.fall precautions. Skin care. Encourage to be out of bed.   L3 burst fracture S/p surgical repair. Has neurosurgery follow up. Continue norco 7.5-325 mg 1-2 tab q4h prn pain. Get PMR referral. Back precautions. Continue robaxin 750 mg q6h prn muscle spasm. Continue vit D and calcium. Will have patient work with PT/OT as tolerated to regain strength and restore function.  Fall precautions are in place.  Blood loss anemia Post op, check cbc  Tremors Continue propranolol 80 mg daily  Hyperlipidemia Stable, continue crestor  gerd Stable, continue esomeprazole  Neuropathic pain Continue gabapentin 300 mg tid, fall precautions  Constipation Cont colace 100 mg bid  Dry mouth Add biotene mouth  rinse every 6h prn and monitor    Goals of care: short term rehabilitation   Labs/tests ordered: cbc, bmp 05/10/16  Family/ staff Communication: reviewed care plan with patient and nursing supervisor    Blanchie Serve, MD Internal Medicine [place],  [address] Cell Phone (Monday-Friday 8 am - 5 pm): [phone] On Call: [phone] and follow prompts after 5 pm and on weekends Office Phone: [phone] Office Fax: [phone]

## 2016-05-10 LAB — CBC AND DIFFERENTIAL
HEMATOCRIT: 30 % — AB (ref 36–46)
HEMOGLOBIN: 9.6 g/dL — AB (ref 12.0–16.0)
NEUTROS ABS: 8 /uL
Platelets: 244 10*3/uL (ref 150–399)
WBC: 10.1 10*3/mL

## 2016-05-10 LAB — BASIC METABOLIC PANEL
BUN: 12 mg/dL (ref 4–21)
Creatinine: 0.5 mg/dL (ref 0.5–1.1)
Glucose: 94 mg/dL
Potassium: 4.6 mmol/L (ref 3.4–5.3)
Sodium: 139 mmol/L (ref 137–147)

## 2016-05-24 ENCOUNTER — Encounter: Payer: Self-pay | Admitting: Adult Health

## 2016-05-24 ENCOUNTER — Non-Acute Institutional Stay (SKILLED_NURSING_FACILITY): Payer: Medicare Other | Admitting: Adult Health

## 2016-05-24 DIAGNOSIS — D62 Acute posthemorrhagic anemia: Secondary | ICD-10-CM

## 2016-05-24 DIAGNOSIS — S32001S Stable burst fracture of unspecified lumbar vertebra, sequela: Secondary | ICD-10-CM | POA: Diagnosis not present

## 2016-05-24 DIAGNOSIS — R251 Tremor, unspecified: Secondary | ICD-10-CM | POA: Diagnosis not present

## 2016-05-24 DIAGNOSIS — E785 Hyperlipidemia, unspecified: Secondary | ICD-10-CM | POA: Diagnosis not present

## 2016-05-24 DIAGNOSIS — R5381 Other malaise: Secondary | ICD-10-CM | POA: Diagnosis not present

## 2016-05-24 DIAGNOSIS — K219 Gastro-esophageal reflux disease without esophagitis: Secondary | ICD-10-CM

## 2016-05-24 DIAGNOSIS — M792 Neuralgia and neuritis, unspecified: Secondary | ICD-10-CM

## 2016-05-24 DIAGNOSIS — K5900 Constipation, unspecified: Secondary | ICD-10-CM

## 2016-05-24 NOTE — Progress Notes (Signed)
Patient ID: [name], female   DOB: [birth year], 75 y.o.   MRN: [id number]    DATE:  05/24/2016   MRN:  [id number]  BIRTHDAY: 02-07-1941  Facility:  Nursing Home Location:  [place] Room Number: 1103-A  LEVEL OF CARE:  SNF (31)  Contact Information    Name Relation Home Work [place] [phone]         Code Status History    Date Active Date Inactive Code Status Order ID Comments User Context   05/03/2016  6:48 PM 05/08/2016  8:57 PM Full Code [id number]  Kevan Ny Ditty, MD Inpatient       Chief Complaint  Patient presents with  . Discharge Note    HISTORY OF PRESENT ILLNESS:  This is a 75 year old female who is for discharge home with Home health PT, OT, CNA and Nursing. DME:  Bedside commode and rolling walker.  She has been admitted to [facility] on 05/08/16 from [facility] with L3 burst fracture for which she had L3 corpectomy with L1-5 fixation and fusion.  Patient was admitted to this facility for short-term rehabilitation after the patient's recent hospitalization.  Patient has completed SNF rehabilitation and therapy has cleared the patient for discharge.   PAST MEDICAL HISTORY:  Past Medical History:  Diagnosis Date  . Anxiety   . Arthritis   . Endometrial cancer ([place]) dx'd 01/2008   recurrences 08/2011; 01/2012; xrt comp 03/2012  . GERD (gastroesophageal reflux disease)   . Hyperlipidemia   . Hypertension   . Migraines   . Radiation 10/2011   External Beam/Intracavitary  . S/P radiation therapy 01/29/12 - 03/10/12   Periaortic area/ 5600 cgy/ 28 Fractions     CURRENT MEDICATIONS: Reviewed  Patient's Medications  New Prescriptions   No medications on file  Previous Medications   ACETAMINOPHEN (TYLENOL) 500 MG TABLET    Take 500-1,000 mg by mouth every 8 (eight) hours as needed for mild pain or moderate pain.   ATORVASTATIN (LIPITOR) 80 MG TABLET    Take 80 mg by mouth daily at 6  PM.   CALCIUM CARBONATE (CALTRATE 600 PO)    Take 1 tablet by mouth 3 (three) times daily.    CALCIUM CARBONATE (TUMS - DOSED IN MG ELEMENTAL CALCIUM) 500 MG CHEWABLE TABLET    Chew 1 tablet by mouth every 8 (eight) hours as needed for heartburn.    CHOLECALCIFEROL (VITAMIN D) 1000 UNITS TABLET    Take 1,000 Units by mouth daily.     DOCUSATE SODIUM (COLACE) 100 MG CAPSULE    Take 1 capsule (100 mg total) by mouth 2 (two) times daily.   ESOMEPRAZOLE (NEXIUM) 20 MG CAPSULE    Take 20 mg by mouth daily.    GABAPENTIN (NEURONTIN) 300 MG CAPSULE    Take 1 capsule (300 mg total) by mouth 3 (three) times daily.   GLUCOSAMINE-CHONDROITIN 500-400 MG TABLET    Take 1 tablet by mouth daily.   HYDROCODONE-ACETAMINOPHEN (NORCO) 7.5-325 MG TABLET    Take 1-2 tablets by mouth every 4 (four) hours as needed for moderate pain.   MENTHOL-CETYLPYRIDINIUM (CEPACOL) 3 MG LOZENGE    Take 1 lozenge by mouth every 4 (four) hours as needed for sore throat.   METHOCARBAMOL (ROBAXIN) 750 MG TABLET    Take 1 tablet (750 mg total) by mouth every 6 (six) hours as needed for muscle spasms.   MULTIPLE VITAMIN (DAILY VALUE MULTIVITAMIN  PO)    Take 1 tablet by mouth daily.   PROPRANOLOL ER (INDERAL LA) 80 MG 24 HR CAPSULE    Take 80 mg by mouth daily.    PSYLLIUM (METAMUCIL) 58.6 % POWDER    Take 1 packet by mouth daily as needed.    VITAMIN C (ASCORBIC ACID) 500 MG TABLET    Take 500 mg by mouth daily.     VITAMIN E (VITAMIN E) 400 UNIT CAPSULE    Take 400 Units by mouth daily.   ZINC GLUCONATE 50 MG TABLET    Take 50 mg by mouth daily.    Modified Medications   No medications on file  Discontinued Medications   ROSUVASTATIN (CRESTOR) 20 MG TABLET    Take 20 mg by mouth daily.     No Known Allergies   REVIEW OF SYSTEMS:  GENERAL: no change in appetite, no fatigue, no weight changes, no fever, chills or weakness EYES: Denies change in vision, dry eyes, eye pain, itching or discharge EARS: Denies change in hearing,  ringing in ears, or earache NOSE: Denies nasal congestion or epistaxis MOUTH and THROAT: Denies oral discomfort, gingival pain or bleeding, pain from teeth or hoarseness   RESPIRATORY: no cough, SOB, DOE, wheezing, hemoptysis CARDIAC: no chest pain, edema or palpitations GI: no abdominal pain, diarrhea, heart burn, nausea or vomiting, +constipation GU: Denies dysuria, frequency, hematuria, incontinence, or discharge PSYCHIATRIC: Denies feeling of depression or anxiety. No report of hallucinations, insomnia, paranoia, or agitation    PHYSICAL EXAMINATION  GENERAL APPEARANCE: Well nourished. In no acute distress. Normal body habitus SKIN:  2 surgical incisions on midline lower back and 1 incision on left lateral side covered with honeycomb dressing, dry and no erythema HEAD: Normal in size and contour. No evidence of trauma EYES: Lids open and close normally. No blepharitis, entropion or ectropion. PERRL. Conjunctivae are clear and sclerae are white. Lenses are without opacity EARS: Pinnae are normal. Patient hears normal voice tunes of the examiner MOUTH and THROAT: Lips are without lesions. Oral mucosa is moist and without lesions. Tongue is normal in shape, size, and color and without lesions NECK: supple, trachea midline, no neck masses, no thyroid tenderness, no thyromegaly LYMPHATICS: no LAN in the neck, no supraclavicular LAN RESPIRATORY: breathing is even & unlabored, BS CTAB CARDIAC: RRR, no murmur,no extra heart sounds, no edema GI: abdomen soft, normal BS, no masses, no tenderness, no hepatomegaly, no splenomegaly EXTREMITIES:  Able to move X 4 extremities PSYCHIATRIC: Alert and oriented X 3. Affect and behavior are appropriate  LABS/RADIOLOGY: Labs reviewed: Basic Metabolic Panel:  Recent Labs  04/30/16 1025 05/04/16 0309 05/10/16  NA 141 143 139  K 3.7 3.5 4.6  CL 106 108  --   CO2 25 25  --   GLUCOSE 105* 150*  --   BUN 10 7 12   CREATININE 0.64 0.74 0.5  CALCIUM  10.2 8.6*  --     CBC:  Recent Labs  04/30/16 1025 05/04/16 0309 05/10/16  WBC 10.0 10.0 10.1  NEUTROABS  --   --  8  HGB 14.3 10.0* 9.6*  HCT 43.8 32.2* 30*  MCV 95.6 99.4  --   PLT 245 162 244     Dg Lumbar Spine 2-3 Views  Result Date: 05/03/2016 CLINICAL DATA:  Imaging from L3 corpectomy with L1 through L5 posterior fusion. EXAM: DG C-ARM GT 120 MIN; LUMBAR SPINE - 2-3 VIEW FLUOROSCOPY TIME:  If the device does not provide the exposure index:  Fluoroscopy Time (in minutes and seconds):  4 minutes and 59 seconds Number of Acquired Images:  6 COMPARISON:  MRI, 05/01/2016 FINDINGS: Images show placement of an intervertebral device which spans the disc level of L2-L3 to the upper endplate of L4, crossing the fractured L3 vertebrae. Subsequent imaging shows placement of pedicle screws interconnecting rods at L1, L2, L4 and L5. The orthopedic hardware appears well-seated and aligned. There is no evidence of an operative complication. IMPRESSION: Operative imaging during and following L3 corpectomy with spine stabilization as detailed. Electronically Signed   By: Lajean Manes M.D.   On: 05/03/2016 15:51   Ct Lumbar Spine W Contrast  Result Date: 05/01/2016 CLINICAL DATA:  Painful vertebral body compression fracture. Symptoms for 3 weeks. History of uterine and vaginal cancer. EXAM: CT LUMBAR SPINE WITH CONTRAST TECHNIQUE: Multidetector CT imaging of the lumbar spine was performed with intravenous contrast administration. Multiplanar CT image reconstructions were also generated. CONTRAST:  Isovue 300, 75 mL COMPARISON:  MRI lumbar spine 04/19/2016. FINDINGS: Segmentation: Normal. Alignment:  Normal. Vertebrae: Severe compression fracture of L3, greater than 50% loss of vertebral body height. Large retropulsed fragment of bone into the canal, as assessed on axial and sagittal imaging, measures 18 x 10 x 17 mm. Severe spinal stenosis at this level.No visible deformity of the L2 vertebral body,  despite mild edema on MR. No specific features which suggest a pathologic fracture of L3. No definite involvement of the L3 pedicles. Conus medullaris: Not assessed. Paraspinal tissues: No evidence for hydronephrosis or paravertebral mass. Disc levels: L1-L2:  Unremarkable. L2-L3: Severe stenosis deriving from retropulsed bone fragment is slightly greater below the interspace, worse on the LEFT. Posterior element hypertrophy is superimposed. L3-L4: Mild stenosis related annular bulging and posterior element hypertrophy. L4-L5:  Unremarkable disc space.  Posterior element hypertrophy. L5-S1:  Unremarkable disc space.  Posterior element hypertrophy. No abnormal postcontrast enhancement is evident. Compared with prior MR, similar degree of vertebral body compression and retropulsion. IMPRESSION: Severe acute compression fracture of L3, with significant retropulsion of bony fragment into the canal. Significant spinal stenosis at this level. No specific features which suggest a pathologic fracture are evident. Electronically Signed   By: Staci Righter M.D.   On: 05/01/2016 10:29   Mr Lumbar Spine W Wo Contrast  Result Date: 05/01/2016 CLINICAL DATA:  Low back pain for approximately 6 weeks since being on the floor fixing a sink. History of prior carcinoma and radiation therapy. Subsequent encounter. EXAM: MRI LUMBAR SPINE WITHOUT AND WITH CONTRAST TECHNIQUE: Multiplanar and multiecho pulse sequences of the lumbar spine were obtained without and with intravenous contrast. CONTRAST:  15 ml MULTIHANCE GADOBENATE DIMEGLUMINE 529 MG/ML IV SOLN COMPARISON:  MRI lumbar spine 04/19/2016. CT lumbar spine 05/01/2016. FINDINGS: Segmentation:  Unremarkable. Alignment: Retropulsed bone off of the L3 vertebral body is identified and described below. Convex right scoliosis is noted. Vertebrae: Severe compression fracture deformity of L3 with areas of up to 90% vertebral body height loss is again seen. Marrow edema is present  throughout the vertebral body. Small focus of marrow edema in the anterior aspect of L2 appears slightly improved compared to the prior examination. The L1, L2, L4, L5 vertebra and sacrum demonstrate increased T1 signal consistent with prior radiation therapy. No evidence of metastatic disease is identified. Conus medullaris: Extends to the L1 level and appears normal. Paraspinal and other soft tissues: Unremarkable. Disc levels: T10-11 is imaged in the sagittal plane only and negative. T11-12: Right paracentral protrusion slightly deforms right hemicord.  The appearance is not markedly changed. T12-L1: Shallow disc bulge without central canal or foraminal stenosis. L1-2:  Negative. L2-3: Large retropulsed fragment off the superior endplate of L3 centrally and slightly to the left is again seen and results in moderate to moderately severe central canal stenosis and some narrowing in the left lateral recess. No nerve root compression is identified. The appearance is unchanged. L3-4: Shallow disc bulge is seen there is facet arthropathy. The central canal and foramina are open. L4-5: Facet degenerative change and a shallow disc bulge without central canal or foraminal stenosis, stable in appearance. L5-S1:  Negative. IMPRESSION: Severe, subacute compression fracture of L3. Retropulsed bone off the posterior margin of L3 causing moderate to moderately severe central canal stenosis and some narrowing in the left lateral recess is unchanged. Although the fracture cannot be definitively characterized, it has an appearance most compatible with a senile osteoporotic or posttraumatic injury. No evidence of metastatic disease is seen. Some improvement in marrow edema in the anterior, inferior aspect of L2 consistent with resolving contusion or microfracture. Post radiation changes in marrow of the lumbar spine. Electronically Signed   By: Inge Rise M.D.   On: 05/01/2016 11:41   Dg C-arm Gt 120 Min  Result Date:  05/03/2016 CLINICAL DATA:  Imaging from L3 corpectomy with L1 through L5 posterior fusion. EXAM: DG C-ARM GT 120 MIN; LUMBAR SPINE - 2-3 VIEW FLUOROSCOPY TIME:  If the device does not provide the exposure index: Fluoroscopy Time (in minutes and seconds):  4 minutes and 59 seconds Number of Acquired Images:  6 COMPARISON:  MRI, 05/01/2016 FINDINGS: Images show placement of an intervertebral device which spans the disc level of L2-L3 to the upper endplate of L4, crossing the fractured L3 vertebrae. Subsequent imaging shows placement of pedicle screws interconnecting rods at L1, L2, L4 and L5. The orthopedic hardware appears well-seated and aligned. There is no evidence of an operative complication. IMPRESSION: Operative imaging during and following L3 corpectomy with spine stabilization as detailed. Electronically Signed   By: Lajean Manes M.D.   On: 05/03/2016 15:51    ASSESSMENT/PLAN:  Physical deconditioning - for Home health PT, OT, CNA and Nursing, for therapeutic strengthening exercises; fall precaution  L3 burst fracture S/P surgical repair - follow-up with neurosurgeon; continue Norco 7.5/325 mg 1-2 tabs PO Q 4 H PRN, and Tylenol ES 500 mg 1 tab PO Q 8 hours PRN for pain; Robaxin 750 mg 1 tab by mouth every 6 hours when necessary for muscle spasm; keep surgical site clean and dry; for Home health PT, OT, CNA and Nursing for therapeutic strengthening exercises  Tremors - continue Inderal LA 80 mg 1 capsule by mouth daily  Acute blood loss anemia - start FeSO4 325 mg 1 tab PO daily; CBC in 2 weeks; follow-up with PCP Lab Results  Component Value Date   HGB 9.6 (A) 05/10/2016   Hyperlipidemia - continue Lipitor 80 mg 1 tab PO Q HS   Neuropathy - continue Gabapentin 300 mg 1 capsule PO TID  GERD - continue Nexium 24 HR 1 capsule PO daily  Constipation - discontinue Colace; start Senna-S 8.6-50 mg 2 tabs PO BID       I have filled out patient's discharge paperwork and written  prescriptions.  Patient will receive home health PT, OT, Nursing and CNA.  DME provided:  Bedside commode and Rolling walker  Total discharge time: Greater than 30 minutes Greater than 50% was spent in counseling and coordination of care  with the patient.   Discharge time involved coordination of the discharge process with social worker, nursing staff and therapy department. Medical justification for home health services/DME verified.     Durenda Age, NP [employer] [phone]

## 2016-07-10 ENCOUNTER — Other Ambulatory Visit: Payer: Self-pay | Admitting: Gynecologic Oncology

## 2016-07-10 DIAGNOSIS — C541 Malignant neoplasm of endometrium: Secondary | ICD-10-CM

## 2016-07-11 ENCOUNTER — Telehealth: Payer: Self-pay | Admitting: *Deleted

## 2016-07-11 NOTE — Telephone Encounter (Signed)
Notified patient of scheduled appointment on 11/16  @ 12:00 for labs and  on 11/20 @  9:30  For CT scan. Pt will pick up prep kit and directions for CT scan on 11/16 when she comes for labs.Pt agreed with time and date of appts

## 2016-07-19 ENCOUNTER — Other Ambulatory Visit (HOSPITAL_BASED_OUTPATIENT_CLINIC_OR_DEPARTMENT_OTHER): Payer: Medicare Other

## 2016-07-19 DIAGNOSIS — C541 Malignant neoplasm of endometrium: Secondary | ICD-10-CM | POA: Diagnosis not present

## 2016-07-19 LAB — BASIC METABOLIC PANEL
ANION GAP: 9 meq/L (ref 3–11)
BUN: 10.1 mg/dL (ref 7.0–26.0)
CO2: 25 mEq/L (ref 22–29)
Calcium: 9.5 mg/dL (ref 8.4–10.4)
Chloride: 106 mEq/L (ref 98–109)
Creatinine: 0.7 mg/dL (ref 0.6–1.1)
EGFR: 86 mL/min/{1.73_m2} — AB (ref >90)
GLUCOSE: 91 mg/dL (ref 70–140)
POTASSIUM: 4.2 meq/L (ref 3.5–5.1)
Sodium: 140 mEq/L (ref 136–145)

## 2016-07-23 ENCOUNTER — Encounter: Payer: Self-pay | Admitting: Gynecology

## 2016-07-23 ENCOUNTER — Ambulatory Visit (HOSPITAL_BASED_OUTPATIENT_CLINIC_OR_DEPARTMENT_OTHER)
Admission: RE | Admit: 2016-07-23 | Discharge: 2016-07-23 | Disposition: A | Discharge status: Home / Self Care | Payer: Medicare Other | Source: Ambulatory Visit | Attending: Gynecologic Oncology | Admitting: Gynecologic Oncology

## 2016-07-23 ENCOUNTER — Ambulatory Visit: Payer: Medicare Other | Attending: Gynecology | Admitting: Gynecology

## 2016-07-23 ENCOUNTER — Telehealth: Payer: Self-pay | Admitting: Gynecologic Oncology

## 2016-07-23 ENCOUNTER — Other Ambulatory Visit (HOSPITAL_BASED_OUTPATIENT_CLINIC_OR_DEPARTMENT_OTHER): Payer: Medicare Other

## 2016-07-23 ENCOUNTER — Other Ambulatory Visit: Payer: Self-pay

## 2016-07-23 ENCOUNTER — Ambulatory Visit (HOSPITAL_COMMUNITY)
Admission: RE | Admit: 2016-07-23 | Discharge: 2016-07-23 | Disposition: A | Discharge status: Home / Self Care | Payer: Medicare Other | Source: Ambulatory Visit | Attending: Gynecologic Oncology | Admitting: Gynecologic Oncology

## 2016-07-23 ENCOUNTER — Other Ambulatory Visit: Payer: Self-pay | Admitting: Gynecologic Oncology

## 2016-07-23 ENCOUNTER — Telehealth: Payer: Self-pay

## 2016-07-23 ENCOUNTER — Encounter (HOSPITAL_COMMUNITY): Payer: Self-pay

## 2016-07-23 VITALS — BP 134/60 | HR 60 | Temp 97.7°F | Resp 18 | Ht 63.0 in | Wt 161.0 lb

## 2016-07-23 DIAGNOSIS — I2699 Other pulmonary embolism without acute cor pulmonale: Secondary | ICD-10-CM

## 2016-07-23 DIAGNOSIS — K760 Fatty (change of) liver, not elsewhere classified: Secondary | ICD-10-CM | POA: Insufficient documentation

## 2016-07-23 DIAGNOSIS — Z9889 Other specified postprocedural states: Secondary | ICD-10-CM | POA: Insufficient documentation

## 2016-07-23 DIAGNOSIS — M1990 Unspecified osteoarthritis, unspecified site: Secondary | ICD-10-CM | POA: Insufficient documentation

## 2016-07-23 DIAGNOSIS — I10 Essential (primary) hypertension: Secondary | ICD-10-CM | POA: Insufficient documentation

## 2016-07-23 DIAGNOSIS — E785 Hyperlipidemia, unspecified: Secondary | ICD-10-CM | POA: Insufficient documentation

## 2016-07-23 DIAGNOSIS — M4856XA Collapsed vertebra, not elsewhere classified, lumbar region, initial encounter for fracture: Secondary | ICD-10-CM | POA: Diagnosis not present

## 2016-07-23 DIAGNOSIS — Z7901 Long term (current) use of anticoagulants: Secondary | ICD-10-CM | POA: Insufficient documentation

## 2016-07-23 DIAGNOSIS — F419 Anxiety disorder, unspecified: Secondary | ICD-10-CM | POA: Diagnosis not present

## 2016-07-23 DIAGNOSIS — Z79899 Other long term (current) drug therapy: Secondary | ICD-10-CM | POA: Insufficient documentation

## 2016-07-23 DIAGNOSIS — R936 Abnormal findings on diagnostic imaging of limbs: Secondary | ICD-10-CM | POA: Insufficient documentation

## 2016-07-23 DIAGNOSIS — C541 Malignant neoplasm of endometrium: Secondary | ICD-10-CM | POA: Insufficient documentation

## 2016-07-23 DIAGNOSIS — Z9049 Acquired absence of other specified parts of digestive tract: Secondary | ICD-10-CM | POA: Insufficient documentation

## 2016-07-23 DIAGNOSIS — Z86711 Personal history of pulmonary embolism: Secondary | ICD-10-CM

## 2016-07-23 DIAGNOSIS — I700 Atherosclerosis of aorta: Secondary | ICD-10-CM | POA: Insufficient documentation

## 2016-07-23 DIAGNOSIS — K219 Gastro-esophageal reflux disease without esophagitis: Secondary | ICD-10-CM | POA: Diagnosis not present

## 2016-07-23 DIAGNOSIS — R600 Localized edema: Secondary | ICD-10-CM | POA: Diagnosis not present

## 2016-07-23 DIAGNOSIS — Z8542 Personal history of malignant neoplasm of other parts of uterus: Secondary | ICD-10-CM | POA: Insufficient documentation

## 2016-07-23 LAB — COMPREHENSIVE METABOLIC PANEL
ALBUMIN: 3.6 g/dL (ref 3.5–5.0)
ALK PHOS: 52 U/L (ref 40–150)
ALT: 12 U/L (ref 0–55)
AST: 27 U/L (ref 5–34)
Anion Gap: 9 mEq/L (ref 3–11)
BUN: 9.1 mg/dL (ref 7.0–26.0)
CALCIUM: 10.5 mg/dL — AB (ref 8.4–10.4)
CHLORIDE: 102 meq/L (ref 98–109)
CO2: 27 mEq/L (ref 22–29)
CREATININE: 0.7 mg/dL (ref 0.6–1.1)
EGFR: 86 mL/min/{1.73_m2} — ABNORMAL LOW (ref >90)
GLUCOSE: 103 mg/dL (ref 70–140)
Potassium: 4 mEq/L (ref 3.5–5.1)
Sodium: 138 mEq/L (ref 136–145)
Total Bilirubin: 0.46 mg/dL (ref 0.20–1.20)
Total Protein: 7.4 g/dL (ref 6.4–8.3)

## 2016-07-23 LAB — CBC WITH DIFFERENTIAL/PLATELET
BASO%: 0.2 % (ref 0.0–2.0)
Basophils Absolute: 0 10*3/uL (ref 0.0–0.1)
EOS%: 0.7 % (ref 0.0–7.0)
Eosinophils Absolute: 0.1 10*3/uL (ref 0.0–0.5)
HEMATOCRIT: 37.6 % (ref 34.8–46.6)
HEMOGLOBIN: 12.3 g/dL (ref 11.6–15.9)
LYMPH#: 1.2 10*3/uL (ref 0.9–3.3)
LYMPH%: 13.4 % — ABNORMAL LOW (ref 14.0–49.7)
MCH: 30.1 pg (ref 25.1–34.0)
MCHC: 32.7 g/dL (ref 31.5–36.0)
MCV: 91.9 fL (ref 79.5–101.0)
MONO#: 0.5 10*3/uL (ref 0.1–0.9)
MONO%: 5.4 % (ref 0.0–14.0)
NEUT#: 7.4 10*3/uL — ABNORMAL HIGH (ref 1.5–6.5)
NEUT%: 80.3 % — ABNORMAL HIGH (ref 38.4–76.8)
Platelets: 223 10*3/uL (ref 145–400)
RBC: 4.09 10*6/uL (ref 3.70–5.45)
RDW: 14.3 % (ref 11.2–14.5)
WBC: 9.2 10*3/uL (ref 3.9–10.3)

## 2016-07-23 MED ORDER — IOPAMIDOL (ISOVUE-300) INJECTION 61%
INTRAVENOUS | Status: AC
  Filled 2016-07-23: qty 100

## 2016-07-23 MED ORDER — IOPAMIDOL (ISOVUE-300) INJECTION 61%
100.0000 mL | Freq: Once | INTRAVENOUS | Status: AC | PRN
  Administered 2016-07-23: 100 mL via INTRAVENOUS

## 2016-07-23 MED ORDER — APIXABAN 5 MG PO TABS: 10.0000 mg | ORAL_TABLET | Freq: Two times a day (BID) | ORAL | 6 refills | Status: DC

## 2016-07-23 MED ORDER — SODIUM CHLORIDE 0.9 % IJ SOLN
INTRAMUSCULAR | Status: AC
  Filled 2016-07-23: qty 50

## 2016-07-23 MED FILL — ELIQUIS 5 MG TABLET: 5 | 25 days supply | Qty: 50 | Fill #0

## 2016-07-23 NOTE — Patient Instructions (Signed)
Plan to have a doppler at [facility] today at [place] taking Eliquis 10 mg twice a day for 7 days then change to 5 mg twice daily.  Plan to meet with the Hematologist, Dr. Burr Medico, at the [facility] on August 07, 2016 at 2:30pm arrive at 2:15pm to get checked in.     Pulmonary Embolism A pulmonary embolism (PE) is a sudden blockage or decrease of blood flow in one lung or both lungs. Most blockages come from a blood clot that travels from the legs or the pelvis to the lungs. PE is a dangerous and potentially life-threatening condition if it is not treated right away. What are the causes? A pulmonary embolism occurs most commonly when a blood clot travels from one of your veins to your lungs. Rarely, PE is caused by air, fat, amniotic fluid, or part of a tumor traveling through your veins to your lungs. What increases the risk? A PE is more likely to develop in:  People who smoke.  People who areolder, especially over 54 years of age.  People who are overweight (obese).  People who sit or lie still for a long time, such as during long-distance travel (over 4 hours), bed rest, hospitalization, or during recovery from certain medical conditions like a stroke.  People who do not engage in much physical activity (sedentary lifestyle).  People who have chronic breathing disorders.  People whohave a personal or family history of blood clots or blood clotting disease.  People whohave peripheral vascular disease (PVD), diabetes, or some types of cancer.  People who haveheart disease, especially if the person had a recent heart attack or has congestive heart failure.  People who have neurological diseases that affect the legs (leg paresis).  People who have had a traumatic injury, such as breaking a hip or leg.  People whohave recently had major or lengthy surgery, especially on the hip, knee, or abdomen.  People who have hada central line placed inside a large vein.  People who  takemedicines that contain the hormone estrogen. These include birth control pills and hormone replacement therapy.  Pregnancy or during childbirth or the postpartum period. What are the signs or symptoms? The symptoms of a PE usually start suddenly and include:  Shortness of breath while active or at rest.  Coughing or coughing up blood or blood-tinged mucus.  Chest pain that is often worse with deep breaths.  Rapid or irregular heartbeat.  Feeling light-headed or dizzy.  Fainting.  Feelinganxious.  Sweating. There may also be pain and swelling in a leg if that is where the blood clot started. These symptoms may represent a serious problem that is an emergency. Do not wait to see if the symptoms will go away. Get medical help right away. Call your local emergency services (911 in the [place] Do not drive yourself to the hospital.  How is this diagnosed? Your health care provider will take a medical history and perform a physical exam. You may also have other tests, including:  Blood tests to assess the clotting properties of your blood, assess oxygen levels in your blood, and find blood clots.  Imaging tests, such as CT, ultrasound, MRI, X-ray, and other tests to see if you have clots anywhere in your body.  An electrocardiogram (ECG) to look for heart strain from blood clots in the lungs. How is this treated? The main goals of PE treatment are:  To stop a blood clot from growing larger.  To stop new  blood clots from forming. The type of treatment that you receive depends on many factors, such as the cause of your PE, your risk for bleeding or developing more clots, and other medical conditions that you have. Sometimes, a combination of treatments is necessary. This condition may be treated with:  Medicines, including newer oral blood thinners (anticoagulants), warfarin, low molecular weight heparins, thrombolytics, or heparins.  Wearing compression stockings or using  different types of devices.  Surgery (rare) to remove the blood clot or to place a filter in your abdomen to stop the blood clot from traveling to your lungs. Treatments for a PE are often divided into immediate treatment, long-term treatment (up to 3 months after PE), and extended treatment (more than 3 months after PE). Your treatment may continue for several months. This is called maintenance therapy, and it is used to prevent the forming of new blood clots. You can work with your health care provider to choose the treatment program that is best for you. What are anticoagulants?  Anticoagulants are medicines that treat PEs. They can stop current blood clots from growing and stop new clots from forming. They cannot dissolve existing clots. Your body dissolves clots by itself over time. Anticoagulants are given by mouth, by injection, or through an IV tube. What are thrombolytics?  Thrombolytics are clot-dissolving medicines that are used to dissolve a PE. They carry a high risk of bleeding, so they tend to be used only in severe cases or if you have very low blood pressure. Follow these instructions at home: If you are taking a newer oral anticoagulant:  Take the medicine every single day at the same time each day.  Understand what foods and drugs interact with this medicine.  Understand that there are no regular blood tests required when using this medicine.  Understandthe side effects of this medicine, including excessive bruising or bleeding. Ask your health care provider or pharmacist about other possible side effects. If you are taking warfarin:  Understand how to take warfarin and know which foods can affect how warfarin works in Veterinary surgeon.  Understand that it is dangerous to taketoo much or too little warfarin. Too much warfarin increases the risk of bleeding. Too little warfarin continues to allow the risk for blood clots.  Follow your PT and INR blood testing schedule. The PT and  INR results allow your health care provider to adjust your dose of warfarin. It is very important that you have your PT and INR tested as often as told by your health care provider.  Avoid major changes in your diet, or tell your health care provider before you change your diet. Arrange a visit with a registered dietitian to answer your questions. Many foods, especially foods that are high in vitamin K, can interfere with warfarin and affect the PT and INR results. Eat a consistent amount of foods that are high in vitamin K, such as:  Spinach, kale, broccoli, cabbage, collard greens, turnip greens, Brussels sprouts, peas, cauliflower, seaweed, and parsley.  Beef liver and pork liver.  Green tea.  Soybean oil.  Tell your health care provider about any and all medicines, vitamins, and supplements that you take, including aspirin and other over-the-counter anti-inflammatory medicines. Be especially cautious with aspirin and anti-inflammatory medicines. Do not take those before you ask your health care provider if it is safe to do so. This is important because many medicines can interfere with warfarin and affect the PT and INR results.  Do not start  or stop taking any over-the-counter or prescription medicine unless your health care provider or pharmacist tells you to do so. If you take warfarin, you will also need to do these things:  Hold pressure over cuts for longer than usual.  Tell your dentist and other health care providers that you are taking warfarin before you have any procedures in which bleeding may occur.  Avoid alcohol or drink very small amounts. Tell your health care provider if you change your alcohol intake.  Do not use tobacco products, including cigarettes, chewing tobacco, and e-cigarettes. If you need help quitting, ask your health care provider.  Avoid contact sports. General instructions  Take over-the-counter and prescription medicines only as told by your health  care provider. Anticoagulant medicines can have side effects, including easy bruising and difficulty stopping bleeding. If you are prescribed an anticoagulant, you will also need to do these things:  Hold pressure over cuts for longer than usual.  Tell your dentist and other health care providers that you are taking anticoagulants before you have any procedures in which bleeding may occur.  Avoid contact sports.  Wear a medical alert bracelet or carry a medical alert card that says you have had a PE.  Ask your health care provider how soon you can go back to your normal activities. Stay active to prevent new blood clots from forming.  Make sure to exercise while traveling or when you have been sitting or standing for a long period of time. It is very important to exercise. Exercise your legs by walking or by tightening and relaxing your leg muscles often. Take frequent walks.  Wear compression stockings as told by your health care provider to help prevent more blood clots from forming.  Do not use tobacco products, including cigarettes, chewing tobacco, and e-cigarettes. If you need help quitting, ask your health care provider.  Keep all follow-up appointments with your health care provider. This is important. How is this prevented? Take these actions to decrease your risk of developing another PE:  Exercise regularly. For at least 30 minutes every day, engage in:  Activity that involves moving your arms and legs.  Activity that encourages good blood flow through your body by increasing your heart rate.  Exercise your arms and legs every hour during long-distance travel (over 4 hours). Drink plenty of water and avoid drinking alcohol while traveling.  Avoid sitting or lying in bed for long periods of time without moving your legs.  Maintain a weight that is appropriate for your height. Ask your health care provider what weight is healthy for you.  If you are a woman who is over 33  years of age, avoid unnecessary use of medicines that contain estrogen. These include birth control pills.  Do not smoke, especially if you take estrogen medicines. If you need help quitting, ask your health care provider.  If you are at very high risk for PE, wear compression stockings.  If you recently had a PE, have regularly scheduled ultrasound testing on your legs to check for new blood clots. If you are hospitalized, prevention measures may include:  Early walking after surgery, as soon as your health care provider says that it is safe.  Receiving anticoagulants to prevent blood clots. If you cannot take anticoagulants, other options may be available, such as wearing compression stockings or using different types of devices. Get help right away if:  You have new or increased pain, swelling, or redness in an arm or leg.  You have numbness or tingling in an arm or leg.  You have shortness of breath while active or at rest.  You have chest pain.  You have a rapid or irregular heartbeat.  You feel light-headed or dizzy.  You cough up blood.  You notice blood in your vomit, bowel movement, or urine.  You have a fever. These symptoms may represent a serious problem that is an emergency. Do not wait to see if the symptoms will go away. Get medical help right away. Call your local emergency services (911 in the [place] Do not drive yourself to the hospital.  This information is not intended to replace advice given to you by your health care provider. Make sure you discuss any questions you have with your health care provider. Document Released: 08/17/2000 Document Revised: 01/26/2016 Document Reviewed: 12/15/2014 Elsevier Interactive Patient Education  2017 [employer].  Apixaban oral tablets What is this medicine? APIXABAN (a PIX a ban) is an anticoagulant (blood thinner). It is used to lower the chance of stroke in people with a medical condition called atrial fibrillation. It  is also used to treat or prevent blood clots in the lungs or in the veins. COMMON BRAND NAME(S): Eliquis What should I tell my health care provider before I take this medicine? They need to know if you have any of these conditions: -bleeding disorders -bleeding in the brain -blood in your stools (black or tarry stools) or if you have blood in your vomit -history of stomach bleeding -kidney disease -liver disease -mechanical heart valve -an unusual or allergic reaction to apixaban, other medicines, foods, dyes, or preservatives -pregnant or trying to get pregnant -breast-feeding How should I use this medicine? Take this medicine by mouth with a glass of water. Follow the directions on the prescription label. You can take it with or without food. If it upsets your stomach, take it with food. Take your medicine at regular intervals. Do not take it more often than directed. Do not stop taking except on your doctor's advice. Stopping this medicine may increase your risk of a blot clot. Be sure to refill your prescription before you run out of medicine. Talk to your pediatrician regarding the use of this medicine in children. Special care may be needed. What if I miss a dose? If you miss a dose, take it as soon as you can. If it is almost time for your next dose, take only that dose. Do not take double or extra doses. What may interact with this medicine? This medicine may interact with the following: -aspirin and aspirin-like medicines -certain medicines for fungal infections like ketoconazole and itraconazole -certain medicines for seizures like carbamazepine and phenytoin -certain medicines that treat or prevent blood clots like warfarin, enoxaparin, and dalteparin -clarithromycin -NSAIDs, medicines for pain and inflammation, like ibuprofen or naproxen -rifampin -ritonavir -St. John's wort What should I watch for while using this medicine? Notify your doctor or health care professional  and seek emergency treatment if you develop breathing problems; changes in vision; chest pain; severe, sudden headache; pain, swelling, warmth in the leg; trouble speaking; sudden numbness or weakness of the face, arm, or leg. These can be signs that your condition has gotten worse. If you are going to have surgery, tell your doctor or health care professional that you are taking this medicine. Tell your health care professional that you use this medicine before you have a spinal or epidural procedure. Sometimes people who take this medicine have bleeding problems around  the spine when they have a spinal or epidural procedure. This bleeding is very rare. If you have a spinal or epidural procedure while on this medicine, call your health care professional immediately if you have back pain, numbness or tingling (especially in your legs and feet), muscle weakness, paralysis, or loss of bladder or bowel control. Avoid sports and activities that might cause injury while you are using this medicine. Severe falls or injuries can cause unseen bleeding. Be careful when using sharp tools or knives. Consider using an Copy. Take special care brushing or flossing your teeth. Report any injuries, bruising, or red spots on the skin to your doctor or health care professional. What side effects may I notice from receiving this medicine? Side effects that you should report to your doctor or health care professional as soon as possible: -allergic reactions like skin rash, itching or hives, swelling of the face, lips, or tongue -signs and symptoms of bleeding such as bloody or black, tarry stools; red or dark-brown urine; spitting up blood or brown material that looks like coffee grounds; red spots on the skin; unusual bruising or bleeding from the eye, gums, or nose Where should I keep my medicine? Keep out of the reach of children. Store at room temperature between 20 and 25 degrees C (68 and 77 degrees F). Throw  away any unused medicine after the expiration date.  2017 Elsevier/Gold Standard (2015-09-22 09:26:49)

## 2016-07-23 NOTE — Progress Notes (Signed)
Spoke with Dr. Elyse Hsu about new diagnosis of PE.  Patient stated he was her PCP.  Doppler ordered per Dr. Alycia Rossetti.  He states he will address the PE.   Spoke with Seth Bake with new patient scheduling to arrange for referral to Hematology per Dr. Alycia Rossetti.

## 2016-07-23 NOTE — Telephone Encounter (Signed)
Spoke with patient about recommendations.  She states she just received a phone call from Dr. Altheimer's office telling her to go to the ED.  She denies chest pain, dyspnea.  She states one month ago she was getting in her car and heard a pop in her left knee and it has been swelling since then.   Per Dr. Alycia Rossetti, since this is an incidental finding and she is asymptomatic, she can come to the [facility] today to see Dr. C-P.  Plan to begin Eliquis per Dr. Alycia Rossetti.  Will plan to see pt today at 1:30pm.

## 2016-07-23 NOTE — Telephone Encounter (Signed)
Informed [name] with new patient referral at [facility] that patient has confirmed appt on Dec 5 with Dr. Burr Medico.   Left message for Dr. Altheimer's assistant notifying them that the patient came to the [facility] and did not go to the ED.  She is asymptomatic from the PE.  Labs and EKG performed in the office.  Hematology appt on Dec 5.  Eliquis prior authorization complete with reference # [id number].  Patient to pick up at the [place] before heading to [facility] for her doppler.

## 2016-07-23 NOTE — Progress Notes (Signed)
VASCULAR LAB PRELIMINARY  PRELIMINARY  PRELIMINARY  PRELIMINARY  Bilateral lower extremity venous duplex completed.    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  No evidence of superficial thrombosis.  No Baker's cyst. Left - No evidence of DVT, superficial thrombosis, or Baker's cyst.  [name], RVS 07/23/2016, 4:42 PM

## 2016-07-23 NOTE — Progress Notes (Signed)
Consult Note: Gyn-Onc   [name] 75 y.o. female  Chief Complaint  Patient presents with  . endometrial cancer    Assessment :Asymptomatic pulmonary embolus detected on CT scan today. Right lower extremity edema probably secondary to a deep vein thrombosis.  Plan: Given the CT scan findings, we'll treat the patient for pulmonary embolus using Eliquis 10 mg twice a day for 7 days and then reducing to 5 mg twice a day. We will obtain an ultrasound of her right lower extremity to further evaluate her leg swelling. She'll also be seen by hematology for further management of her anticoagulants.  Interval History: Earlier today the patient had a CT scan for reassessment of her disease status. On the CT scan and asymptomatic pulmonary embolus was identified. Patient reports she is entirely asymptomatic. She specifically denies any chest pain cough or shortness of breath. She does note that she recently "twisted her knee and "getting out of a car and has had swelling in the right lower extremity below the knee ever since. She has minimal pain in that calf as well.  As noted the patient has a past history of a pulmonary embolus in 2000 after orthopedic surgery.  HPI Patient was initially diagnosed endometrial cancer in June 2009. She underwent a TAH BSO. 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 (Stage IA grade 1). Patient was followed until a nodule in the upper vagina was noted and confirmed by biopsy in September 2012 to be recurrent disease. As further staging the patient had a PET scan in October 2012 which showed hypermetabolic pelvic lymph nodes. The right external iliac node measuring 1.9 cm and a left posterior obturator node measured 0.5 cm. The patient was then treated with whole pelvis radiation therapy and vaginal brachytherapy. Followup PET scan showed resolution of the pelvic adenopathy, however, a new periaortic lymph node  was identified. Subsequently the periaortic chain was radiated. She tolerated this therapy well.   She had PET scan in November 2013 that was negative for recurrent disease.   Review of Systems:10 point review of systems is negative except as noted in interval history.   Vitals: Blood pressure 134/60, pulse 60, temperature 97.7 F (36.5 C), temperature source Oral, resp. rate 18, height 5\' 3"  (1.6 m), weight 161 lb (73 kg), SpO2 100 %.  Physical Exam: General : The patient is a healthy woman in no acute distress.  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 However the venous pattern is more prominent on the right calf and foot. Further she has 1+ edema from the knee distally in the right leg. There is a mildly positive Homans sign.     No Known Allergies  Past Medical History:  Diagnosis Date  . Anxiety   . Arthritis   . Endometrial cancer ([place]) dx'd 01/2008   recurrences 08/2011; 01/2012; xrt comp 03/2012  . GERD (gastroesophageal reflux disease)   . Hyperlipidemia   . Hypertension   . Migraines   . Radiation 10/2011   External Beam/Intracavitary  . S/P radiation  therapy 01/29/12 - 03/10/12   Periaortic area/ 5600 cgy/ 28 Fractions    Past Surgical History:  Procedure Laterality Date  . ANTERIOR LATERAL LUMBAR FUSION 4 LEVELS Left 05/03/2016   Procedure: Lumbar three Corpectomy  with Lumbar one to lumbar five fixation fusion with percutaneous pedicle screws:  left hemilaminectomy open reduction fracture fragment;  Surgeon: Kevan Ny Ditty, MD;  Location: [facility];  Service: Neurosurgery;  Laterality: Left;  . CHOLECYSTECTOMY    . EYE SURGERY    . LUMBAR PERCUTANEOUS PEDICLE SCREW 4 LEVEL Bilateral 05/03/2016   Procedure: LUMBAR PERCUTANEOUS PEDICLE SCREW 4 LEVEL;  Surgeon: Kevan Ny Ditty, MD;  Location: [facility];  Service: Neurosurgery;  Laterality: Bilateral;  . TOTAL ABDOMINAL HYSTERECTOMY W/ BILATERAL SALPINGOOPHORECTOMY  2009    Current Outpatient Prescriptions  Medication Sig Dispense Refill  . acetaminophen (TYLENOL) 500 MG tablet Take 500-1,000 mg by mouth every 8 (eight) hours as needed for mild pain or moderate pain.    Marland Kitchen atorvastatin (LIPITOR) 80 MG tablet Take 80 mg by mouth daily at 6 PM.    . Calcium Carbonate (CALTRATE 600 PO) Take 1 tablet by mouth 3 (three) times daily.     . calcium carbonate (TUMS - DOSED IN MG ELEMENTAL CALCIUM) 500 MG chewable tablet Chew 1 tablet by mouth every 8 (eight) hours as needed for heartburn.     . cholecalciferol (VITAMIN D) 1000 UNITS tablet Take 1,000 Units by mouth daily.      Marland Kitchen docusate sodium (COLACE) 100 MG capsule Take 1 capsule (100 mg total) by mouth 2 (two) times daily. 10 capsule 0  . esomeprazole (NEXIUM) 20 MG capsule Take 20 mg by mouth daily.     Marland Kitchen gabapentin (NEURONTIN) 300 MG capsule Take 1 capsule (300 mg total) by mouth 3 (three) times daily. 90 capsule 2  . glucosamine-chondroitin 500-400 MG tablet Take 1 tablet by mouth daily.    Marland Kitchen HYDROcodone-acetaminophen (NORCO) 7.5-325 MG tablet Take 1-2 tablets by mouth every 4 (four) hours as needed for moderate pain. 60 tablet 0  . methocarbamol (ROBAXIN) 750 MG tablet Take 1 tablet (750 mg total) by mouth every 6 (six) hours as needed for muscle spasms. 120 tablet 2  . Multiple Vitamin (DAILY VALUE MULTIVITAMIN PO) Take 1 tablet by mouth daily.    . propranolol ER (INDERAL LA) 80 MG 24 hr capsule Take 80 mg by mouth daily.      . psyllium (METAMUCIL) 58.6 % powder Take 1 packet by mouth daily as needed.     . vitamin C (ASCORBIC ACID) 500 MG tablet Take 500 mg by mouth daily.      . vitamin E (VITAMIN E) 400 UNIT capsule Take 400 Units by mouth daily.    Marland Kitchen zinc gluconate 50 MG tablet Take 50 mg by mouth daily.       No current facility-administered medications for this visit.    Facility-Administered Medications Ordered in Other Visits  Medication Dose Route Frequency Provider Last Rate Last Dose  . iopamidol (ISOVUE-300) 61 % injection           . sodium chloride 0.9 % injection             Social History   Social History  . Marital status: Single    Spouse name: N/A  . Number of children: N/A  . Years of education: N/A   Occupational History  . Not on file.   Social History Main Topics  . Smoking status:  Never Smoker  . Smokeless tobacco: Never Used  . Alcohol use No  . Drug use: No  . Sexual activity: No   Other Topics Concern  . Not on file   Social History Narrative  . No narrative on file    Family History  Problem Relation Age of Onset  . Heart failure Mother   . Ulcers Father       Marti Sleigh, MD 07/23/2016, 2:09 PM

## 2016-07-23 NOTE — Telephone Encounter (Signed)
Left message asking the patient to please call our office as soon as she gets this message.  Will plan to have pt be seen by her primary care provider today if possible or we can manage here.

## 2016-07-23 NOTE — Telephone Encounter (Signed)
Spoke with Yasmin-need to know based on CT Chest report today if patient needs to be seen here or go to ED for possible pulmonary emobli. Yasmin is aware that I spoke with CY for back up and patient should go to nearest ED for further treatment. If patient needs consult appt afterwards Yasmin will call the office-if it needs to be this week MW has approved for patient to be added in his 8:45am slot Wednesday or Friday-pt should be here no later than 8:30am.

## 2016-07-23 NOTE — Telephone Encounter (Signed)
Received phone call from [facility] about patient.  Positive for DVT in the right leg-popliteal to mid femoral.  Patient has been prescribed Eliquis.  Dr. Alycia Rossetti notified.

## 2016-07-24 ENCOUNTER — Telehealth: Payer: Self-pay | Admitting: Gynecologic Oncology

## 2016-07-24 LAB — PROTHROMBIN TIME (PT)
INR: 1.1 (ref 0.8–1.2)
PROTHROMBIN TIME: 11.2 s (ref 9.1–12.0)

## 2016-07-24 LAB — BRAIN NATRIURETIC PEPTIDE: BNP: 118.7 pg/mL — ABNORMAL HIGH (ref 0.0–100.0)

## 2016-07-24 LAB — APTT: aPTT: 24 s (ref 24–33)

## 2016-07-24 LAB — PROTEIN S, ANTIGEN, FREE: Protein S, Free: 127 % (ref 57–157)

## 2016-07-24 LAB — D-DIMER, QUANTITATIVE (NOT AT ARMC): D-DIMER: 5.88 mg{FEU}/L — AB (ref 0.00–0.49)

## 2016-07-24 NOTE — Telephone Encounter (Signed)
Spoke with patient. Her prior clot about 20 years ago was also in her right leg and her nodal recurrence was also on the right. She has some mild swelling, no pain and is taking her eliquis without difficulty. Spoke with Dr. Servando Snare. We discussed her duplex and that she was fairly asymptomatic. His office will call her to schedule a visit for next week. She is aware and is happy to see him. She will call with any concerns or issues. Nancy Marus

## 2016-07-24 NOTE — Telephone Encounter (Signed)
Spoke with the patient about current status.  She states she seems to be doing well and hopes the medicine is working.  She confirmed that she is taking two tablets twice daily for 7 days then to one tablet twice daily.  Hematology appt is set for Dec 5.  All questions answered.  Advised her that Dr. Alycia Rossetti would review her lab work taken yesterday this afternoon and we would contact her with any recommendations.

## 2016-07-25 ENCOUNTER — Encounter: Payer: Self-pay | Admitting: Surgery

## 2016-07-25 ENCOUNTER — Other Ambulatory Visit: Payer: Self-pay | Admitting: *Deleted

## 2016-07-25 DIAGNOSIS — I8290 Acute embolism and thrombosis of unspecified vein: Secondary | ICD-10-CM

## 2016-07-25 NOTE — Progress Notes (Signed)
   Discussed case with Dr.Gehrig and called patient as well. She will get iliocaval duplex study and be evaluated in our office next week for possible intervention.   Jamile Sivils C. Donzetta Matters, MD [facility] Office: [phone] Pager: [phone]

## 2016-07-27 ENCOUNTER — Encounter: Payer: Self-pay | Admitting: Hematology

## 2016-07-30 ENCOUNTER — Ambulatory Visit (INDEPENDENT_AMBULATORY_CARE_PROVIDER_SITE_OTHER): Payer: Medicare Other | Admitting: Surgery

## 2016-07-30 ENCOUNTER — Encounter: Payer: Self-pay | Admitting: Surgery

## 2016-07-30 ENCOUNTER — Ambulatory Visit (HOSPITAL_COMMUNITY)
Admission: RE | Admit: 2016-07-30 | Discharge: 2016-07-30 | Disposition: A | Discharge status: Home / Self Care | Payer: Medicare Other | Source: Ambulatory Visit | Attending: Surgery | Admitting: Surgery

## 2016-07-30 VITALS — BP 124/74 | HR 61 | Temp 97.2°F | Resp 18 | Ht 63.0 in | Wt 164.2 lb

## 2016-07-30 DIAGNOSIS — I82401 Acute embolism and thrombosis of unspecified deep veins of right lower extremity: Secondary | ICD-10-CM

## 2016-07-30 DIAGNOSIS — I8290 Acute embolism and thrombosis of unspecified vein: Secondary | ICD-10-CM | POA: Insufficient documentation

## 2016-07-30 NOTE — Progress Notes (Signed)
[facility]   Patient name: [name] MRN: [id number] DOB: [birth year] Sex: female  REFERRING PHYSICIAN: Dr. Alycia Rossetti  REASON FOR CONSULT: DVT  HPI: [name] is a 75 y.o. female, who is here today to discuss a recent diagnosis of a PE and DVT.  The patient states that she was going to have a CAT scan for follow-up of her cancer history, and on the scan she was found to have a pulmonary embolism.  She states that she is asymptomatic.  She denies having any shortness of breath.  The patient was also found to have a right leg DVT.  She had back surgery in August 2017.  She was somewhat immobile following her surgery.  She states that in October she was starting to drive again and she heard a pop in her knee and ever since then she has been having swelling in the right leg.  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  This was following a hairline fracture in her right leg.  She is currently on anticoagulation  The patient has a history of endometrial cancer with recurrence.  She has undergone pelvic radiation.  She takes a statin for hypercholesterolemia and is medically managed for hypertension.  Past Medical History:  Diagnosis Date  . Anxiety   . Arthritis   . DVT (deep venous thrombosis) ([place])   . Endometrial cancer ([place]) dx'd 01/2008   recurrences 08/2011; 01/2012; xrt comp 03/2012  . GERD (gastroesophageal reflux disease)   . Hyperlipidemia   . Hypertension   . Migraines   . Radiation 10/2011   External Beam/Intracavitary  . S/P radiation therapy 01/29/12 - 03/10/12   Periaortic area/ 5600 cgy/ 28 Fractions    Family History  Problem Relation Age of Onset  . Heart failure Mother   . Ulcers Father     SOCIAL HISTORY: Social History   Social History  . Marital status: Single    Spouse name: N/A  . Number of children: N/A  . Years of education: N/A   Occupational History  . Not on file.    Social History Main Topics  . Smoking status: Never Smoker  . Smokeless tobacco: Never Used  . Alcohol use No  . Drug use: No  . Sexual activity: No   Other Topics Concern  . Not on file   Social History Narrative  . No narrative on file    No Known Allergies  Current Outpatient Prescriptions  Medication Sig Dispense Refill  . acetaminophen (TYLENOL) 500 MG tablet Take 500-1,000 mg by mouth every 8 (eight) hours as needed for mild pain or moderate pain.    Marland Kitchen apixaban (ELIQUIS) 5 MG TABS tablet Take 2 tablets (10 mg total) by mouth 2 (two) times daily. 10 mg twice daily for 7 days then 5 mg twice daily 50 tablet 6  . Calcium Carbonate (CALTRATE 600 PO) Take 1 tablet by mouth 3 (three) times daily.     . calcium carbonate (TUMS - DOSED IN MG ELEMENTAL CALCIUM) 500 MG chewable tablet Chew 1 tablet by mouth every 8 (eight) hours as needed for heartburn.     . cholecalciferol (VITAMIN D) 1000 UNITS tablet Take 1,000 Units by mouth daily.      . CRESTOR 40 MG tablet     . esomeprazole (NEXIUM) 20 MG capsule Take 20 mg by mouth daily.     Marland Kitchen glucosamine-chondroitin 500-400 MG tablet Take 1 tablet by  mouth daily.    . Multiple Vitamin (DAILY VALUE MULTIVITAMIN PO) Take 1 tablet by mouth daily.    . propranolol ER (INDERAL LA) 80 MG 24 hr capsule Take 80 mg by mouth daily.     . psyllium (METAMUCIL) 58.6 % powder Take 1 packet by mouth daily as needed.     . vitamin C (ASCORBIC ACID) 500 MG tablet Take 500 mg by mouth daily.      . vitamin E (VITAMIN E) 400 UNIT capsule Take 400 Units by mouth daily.    Marland Kitchen zinc gluconate 50 MG tablet Take 50 mg by mouth daily.      Marland Kitchen atorvastatin (LIPITOR) 80 MG tablet Take 80 mg by mouth daily at 6 PM.    . docusate sodium (COLACE) 100 MG capsule Take 1 capsule (100 mg total) by mouth 2 (two) times daily. (Patient not taking: Reported on 07/30/2016) 10 capsule 0  . gabapentin (NEURONTIN) 300 MG capsule Take 1 capsule (300 mg total) by mouth 3 (three)  times daily. (Patient not taking: Reported on 07/30/2016) 90 capsule 2  . HYDROcodone-acetaminophen (NORCO) 7.5-325 MG tablet Take 1-2 tablets by mouth every 4 (four) hours as needed for moderate pain. (Patient not taking: Reported on 07/30/2016) 60 tablet 0  . methocarbamol (ROBAXIN) 750 MG tablet Take 1 tablet (750 mg total) by mouth every 6 (six) hours as needed for muscle spasms. (Patient not taking: Reported on 07/30/2016) 120 tablet 2  . risedronate (ACTONEL) 35 MG tablet      No current facility-administered medications for this visit.     REVIEW OF SYSTEMS:  [X]  denotes positive finding, [ ]  denotes negative finding Cardiac  Comments:  Chest pain or chest pressure:    Shortness of breath upon exertion:    Short of breath when lying flat:    Irregular heart rhythm:        Vascular    Pain in calf, thigh, or hip brought on by ambulation:    Pain in feet at night that wakes you up from your sleep:     Blood clot in your veins: x   Leg swelling:  x       Pulmonary    Oxygen at home:    Productive cough:     Wheezing:         Neurologic    Sudden weakness in arms or legs:     Sudden numbness in arms or legs:     Sudden onset of difficulty speaking or slurred speech:    Temporary loss of vision in one eye:     Problems with dizziness:         Gastrointestinal    Blood in stool:     Vomited blood:         Genitourinary    Burning when urinating:     Blood in urine:        Psychiatric    Major depression:         Hematologic    Bleeding problems:    Problems with blood clotting too easily:        Skin    Rashes or ulcers:        Constitutional    Fever or chills:      PHYSICAL EXAM: Vitals:   07/30/16 0903  BP: 124/74  Pulse: 61  Resp: 18  Temp: 97.2 F (36.2 C)  TempSrc: Oral  SpO2: 97%  Weight: 164 lb 3.2 oz (74.5 kg)  Height:  5\' 3"  (1.6 m)    GENERAL: The patient is a well-nourished female, in no acute distress. The vital signs are documented  above. CARDIAC: There is a regular rate and rhythm.  VASCULAR: Right leg edema PULMONARY: There is good air exchange bilaterally without wheezing or rales. MUSCULOSKELETAL: There are no major deformities or cyanosis. NEUROLOGIC: No focal weakness or paresthesias are detected. SKIN: There are no ulcers or rashes noted. PSYCHIATRIC: The patient has a normal affect.  DATA:  I have reviewed her ultrasound from the hospital which shows occlusive popliteal DVT.  The patient also had a repeat ultrasound today.  No thrombus was visualized within the inferior vena cava.  The iliac veins were not well evaluated.  There was no thrombus within the femoral vein.  There did appear to be mobile thrombus within the superficial femoral vein.  ASSESSMENT AND PLAN: Right leg DVT and PE: Based on the patient's history, I suspect that she had her DVT back in October.  This would put her outside of the time frame for an attempt at from the lysis.  In addition, her symptoms are minimal in her right leg.  I think the most appropriate treatment for her will be anticoagulation for at least 6 months, and compression stockings.  Given that this is her second DVT and PE, both of which appeared to be provoked, (surrounding a trauma or surgery), she might benefit from a hypercoagulable workup or at least anticoagulation surrounding any future invasive procedures   Annamarie Major, MD [facility] [phone] Pager [phone]

## 2016-08-07 ENCOUNTER — Encounter: Payer: Self-pay | Admitting: Hematology

## 2016-08-07 ENCOUNTER — Telehealth: Payer: Self-pay | Admitting: Hematology

## 2016-08-07 ENCOUNTER — Ambulatory Visit (HOSPITAL_BASED_OUTPATIENT_CLINIC_OR_DEPARTMENT_OTHER): Payer: Medicare Other | Admitting: Hematology

## 2016-08-07 DIAGNOSIS — K0530 Chronic periodontitis, unspecified: Secondary | ICD-10-CM

## 2016-08-07 DIAGNOSIS — I82401 Acute embolism and thrombosis of unspecified deep veins of right lower extremity: Secondary | ICD-10-CM | POA: Insufficient documentation

## 2016-08-07 DIAGNOSIS — Z8542 Personal history of malignant neoplasm of other parts of uterus: Secondary | ICD-10-CM | POA: Diagnosis not present

## 2016-08-07 DIAGNOSIS — I82411 Acute embolism and thrombosis of right femoral vein: Secondary | ICD-10-CM

## 2016-08-07 DIAGNOSIS — Z86711 Personal history of pulmonary embolism: Secondary | ICD-10-CM | POA: Insufficient documentation

## 2016-08-07 DIAGNOSIS — I2699 Other pulmonary embolism without acute cor pulmonale: Secondary | ICD-10-CM | POA: Diagnosis not present

## 2016-08-07 NOTE — Telephone Encounter (Signed)
Appointments schedule per 12/5 LOS. Patient given AVS report and calendars with future scheduled appointments. °

## 2016-08-07 NOTE — Progress Notes (Signed)
[facility]  [phone] Fax:([phone]) [place] Note   Patient Care Team: Lorne Skeens, MD as PCP - General (Endocrinology) 08/07/2016  REFERRAL PHYSICIAN: Dr. Nancy Marus   CHIEF COMPLAINTS/PURPOSE OF CONSULTATION:  Recurrent DVT and PE   HISTORY OF PRESENTING ILLNESS:  [name] 75 y.o. female is here because of her recent right DVT and PE. She was referred by her oncologist Dr. Alycia Rossetti for her anticoagulation management.   She had back surgery on 05/03/2016, she was not mobile for a few weeks, and was sent to SNF for rehab. She was finally sent home in October, able to move around, but not very physically active. In mid October, when she was trying to get into her call, she heard her right knee popped, and she developed right lower extremity swelling the next day, he was mild, no significant pain, she did not seek medical attention. 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 She was started on Eliquis, 10 mg twice daily for 7 days, then changed to 5m bid daily. She has been tolerating well, denies any episodes of bleeding or other noticeable side effects.  She had right leg DVT in 1997 after hairline fracture of right tibia, she was treated with coumadin for 6 months.   No family history of thrombophilia.   She was diagnosed with stage I endometrial cancer in June 2009, status post TAH and BSO. She subsequently had vagina and pelvic lymph node recurrence in 2012, she was treated with whole pelvic irradiation and vagina brachytherapy, post treatment PET scan was negative for residual disease. She did have periaortic lymph node recurrence, which was reviewed again in July 2013. Her subsequent  follow-up PET scan has showed no evidence of recurrence, including the most recent CT scan on 07/23/2016. She also does annual screening mammogram, no history of other malignancy.  She is single, no pregnancy. No family history of malignancy or blood clots   MEDICAL HISTORY:  Past Medical History:  Diagnosis Date  . Anxiety   . Arthritis   . DVT (deep venous thrombosis) ([place]   . [place]ometrial [place] dx[place] 01/2008   recurrences 08/2011; 01/2012; xrt comp 03/2012  . GERD (gastroesophageal reflux disease)   . Hyperlipidemia   . Hypertension   . Migraines   . Radiation 10/2011   External Beam/Intracavitary  . S/P radiation therapy 01/29/12 - 03/10/12   Periaortic area/ 5600 cgy/ 28 Fractions    SURGICAL HISTORY: Past Surgical History:  Procedure Laterality Date  . ANTERIOR LATERAL LUMBAR FUSION 4 LEVELS Left 05/03/2016   Procedure: Lumbar three Corpectomy  with Lumbar one to lumbar five fixation fusion with percutaneous pedicle screws: left hemilaminectomy open reduction fracture fragment;  Surgeon: BKevan NyDitty, MD;  [facility];  Service: Neurosurgery;  Laterality: Left;  . CHOLECYSTECTOMY    . EYE SURGERY    . LUMBAR PERCUTANEOUS PEDICLE SCREW 4 LEVEL Bilateral 05/03/2016   Procedure: LUMBAR PERCUTANEOUS PEDICLE SCREW 4 LEVEL;  Surgeon: BKevan NyDitty, MD;  [facility];  Service: Neurosurgery;  Laterality: Bilateral;  . TOTAL ABDOMINAL HYSTERECTOMY W/ BILATERAL SALPINGOOPHORECTOMY  2009    SOCIAL HISTORY: Social History   Social History  . Marital status: Single  Spouse name: N/A  . Number of children: N/A  . Years of education: N/A   Occupational History  . Not on file.   Social History Main Topics  . Smoking status: Never Smoker  . Smokeless tobacco: Never Used  . Alcohol use No  . Drug use: No  . Sexual activity: No   Other Topics Concern  . Not on file   Social History Narrative  . No narrative on file    FAMILY  HISTORY: Family History  Problem Relation Age of Onset  . Heart failure Mother   . Ulcers Father     ALLERGIES:  has No Known Allergies.  MEDICATIONS:  Current Outpatient Prescriptions  Medication Sig Dispense Refill  . acetaminophen (TYLENOL) 500 MG tablet Take 500-1,000 mg by mouth every 8 (eight) hours as needed for mild pain or moderate pain.    Marland Kitchen apixaban (ELIQUIS) 5 MG TABS tablet Take 2 tablets (10 mg total) by mouth 2 (two) times daily. 10 mg twice daily for 7 days then 5 mg twice daily 50 tablet 6  . Calcium Carbonate (CALTRATE 600 PO) Take 1 tablet by mouth 3 (three) times daily.     . calcium carbonate (TUMS - DOSED IN MG ELEMENTAL CALCIUM) 500 MG chewable tablet Chew 1 tablet by mouth every 8 (eight) hours as needed for heartburn.     . cholecalciferol (VITAMIN D) 1000 UNITS tablet Take 1,000 Units by mouth daily.      . CRESTOR 40 MG tablet 20 mg.     . esomeprazole (NEXIUM) 20 MG capsule Take 20 mg by mouth daily.     Marland Kitchen gabapentin (NEURONTIN) 300 MG capsule Take 1 capsule (300 mg total) by mouth 3 (three) times daily. 90 capsule 2  . glucosamine-chondroitin 500-400 MG tablet Take 1 tablet by mouth daily.    . Multiple Vitamin (DAILY VALUE MULTIVITAMIN PO) Take 1 tablet by mouth daily.    . propranolol ER (INDERAL LA) 80 MG 24 hr capsule Take 80 mg by mouth daily.     . psyllium (METAMUCIL) 58.6 % powder Take 1 packet by mouth daily as needed.     . risedronate (ACTONEL) 35 MG tablet     . vitamin C (ASCORBIC ACID) 500 MG tablet Take 500 mg by mouth daily.      . vitamin E (VITAMIN E) 400 UNIT capsule Take 400 Units by mouth daily.    Marland Kitchen zinc gluconate 50 MG tablet Take 50 mg by mouth daily.      Marland Kitchen docusate sodium (COLACE) 100 MG capsule Take 1 capsule (100 mg total) by mouth 2 (two) times daily. (Patient not taking: Reported on 07/30/2016) 10 capsule 0   No current facility-administered medications for this visit.     REVIEW OF SYSTEMS:   Constitutional: Denies fevers,  chills or abnormal night sweats Eyes: Denies blurriness of vision, double vision or watery eyes Ears, nose, mouth, throat, and face: Denies mucositis or sore throat Respiratory: Denies cough, dyspnea or wheezes Cardiovascular: Denies palpitation, chest discomfort or lower extremity swelling Gastrointestinal:  Denies nausea, heartburn or change in bowel habits Skin: Denies abnormal skin rashes Lymphatics: Denies new lymphadenopathy or easy bruising Neurological:Denies numbness, tingling or new weaknesses Behavioral/Psych: Mood is stable, no new changes  All other systems were reviewed with the patient and are negative.  PHYSICAL EXAMINATION: ECOG PERFORMANCE STATUS: 1 - Symptomatic but completely ambulatory  Vitals:   08/07/16 1437  BP: (!) 129/58  Pulse: 61  Resp: 17  Temp: 98.2 F (36.8 C)   Filed Weights   08/07/16 1437  Weight: 165 lb 9.6 oz (75.1 kg)    GENERAL:alert, no distress and comfortable SKIN: skin color, texture, turgor are normal, no rashes or significant lesions EYES: normal, conjunctiva are pink and non-injected, sclera clear OROPHARYNX:no exudate, no erythema and lips, buccal mucosa, and tongue normal  NECK: supple, thyroid normal size, non-tender, without nodularity LYMPH:  no palpable lymphadenopathy in the cervical, axillary or inguinal LUNGS: clear to auscultation and percussion with normal breathing effort HEART: regular rate & rhythm and no murmurs and no lower extremity edema ABDOMEN:abdomen soft, non-tender and normal bowel sounds Musculoskeletal:no cyanosis of digits and no clubbing, Surgical incisions at the lumbar spine is well healed. She wears a spinal brace.  PSYCH: alert & oriented x 3 with fluent speech NEURO: no focal motor/sensory deficits  LABORATORY DATA:  I have reviewed the data as listed Recent Results (from the past 2160 hour(s))  CBC and differential     Status: Abnormal   Collection Time: 05/10/16 12:00 AM  Result Value Ref  Range   Hemoglobin 9.6 (A) 12.0 - 16.0 g/dL   HCT 30 (A) 36 - 46 %   Neutrophils Absolute 8 /L   Platelets 244 150 - 399 K/L   WBC 10.1 96^2/XB  Basic metabolic panel     Status: None   Collection Time: 05/10/16 12:00 AM  Result Value Ref Range   Glucose 94 mg/dL   BUN 12 4 - 21 mg/dL   Creatinine 0.5 0.5 - 1.1 mg/dL   Potassium 4.6 3.4 - 5.3 mmol/L   Sodium 139 137 - 147 mmol/L  Basic metabolic panel     Status: Abnormal   Collection Time: 07/19/16 11:30 AM  Result Value Ref Range   Sodium 140 136 - 145 mEq/L   Potassium 4.2 3.5 - 5.1 mEq/L   Chloride 106 98 - 109 mEq/L   CO2 25 22 - 29 mEq/L   Glucose 91 70 - 140 mg/dl    Comment: Glucose reference range is for nonfasting patients. Fasting glucose reference range is 70- 100.   BUN 10.1 7.0 - 26.0 mg/dL   Creatinine 0.7 0.6 - 1.1 mg/dL   Calcium 9.5 8.4 - 10.4 mg/dL   Anion Gap 9 3 - 11 mEq/L   EGFR 86 (L) >90 ml/min/1.73 m2    Comment: eGFR is calculated using the CKD-EPI Creatinine Equation (2009)  APTT     Status: None   Collection Time: 07/23/16  2:55 PM  Result Value Ref Range   aPTT 24 24 - 33 sec    Comment: This test has not been validated for monitoring unfractionated heparin therapy. aPTT-based therapeutic ranges for unfractionated heparin therapy have not been established. For general guidelines on Heparin monitoring, refer to the [employer].   Prothrombin Time (PT)     Status: None   Collection Time: 07/23/16  2:55 PM  Result Value Ref Range   INR 1.1 0.8 - 1.2    Comment:                Reference interval is for non-anticoagulated patients.                Suggested INR therapeutic range for Vitamin K                antagonist therapy:                   Standard  Dose (moderate intensity                                  therapeutic range):       2.0 - 3.0                   Higher intensity therapeutic range       2.5 - 3.5    Prothrombin Time 11.2 9.1 - 12.0 sec  Protein S, Antigen,  Free*     Status: None   Collection Time: 07/23/16  2:55 PM  Result Value Ref Range   Protein S, Free 127 57 - 157 %    Comment: This test was developed and its performance characteristics determined by [facility]. It has not been cleared or approved by the Food and Drug Administration.   BNP (Brain natriuretic peptide)     Status: Abnormal   Collection Time: 07/23/16  2:55 PM  Result Value Ref Range   BNP 118.7 (H) 0.0 - 100.0 pg/mL  D-dimer, quantitative     Status: Abnormal   Collection Time: 07/23/16  2:55 PM  Result Value Ref Range   D-DIMER 5.88 (H) 0.00 - 0.49 mg/L FEU    Comment: According to the assay manufacturer's published package insert, a normal (<0.50 mg/L FEU) D-dimer result in conjunction with a non-high clinical probability assessment, excludes deep vein thrombosis (DVT) and pulmonary embolism (PE) with high sensitivity. D-dimer values increase with age and this can make VTE exclusion of an older population difficult. To address this, the [place], based on best available evidence and recent guidelines, recommends that clinicians use age-adjusted D-dimer thresholds in patients greater than 73 years of age with: a) a low probability of PE who do not meet all Pulmonary Embolism Rule Out Criteria, or b) in those with intermediate probability of PE. The formula for an age-adjusted D-dimer cut-off is "age/100". For example, a 74 year old patient would have an age-adjusted cut-off of 0.60 mg/L FEU and an 74 year old 0.80 mg/L FEU.   CBC with Differential     Status: Abnormal   Collection Time: 07/23/16  2:56 PM  Result Value Ref Range   WBC 9.2 3.9 - 10.3 10e3/uL   NEUT# 7.4 (H) 1.5 - 6.5 10e3/uL   HGB 12.3 11.6 - 15.9 g/dL   HCT 37.6 34.8 - 46.6 %   Platelets 223 145 - 400 10e3/uL   MCV 91.9 79.5 - 101.0 fL   MCH 30.1 25.1 - 34.0 pg   MCHC 32.7 31.5 - 36.0 g/dL   RBC 4.09 3.70 - 5.45 10e6/uL   RDW 14.3 11.2 - 14.5 %   lymph# 1.2 0.9 - 3.3  10e3/uL   MONO# 0.5 0.1 - 0.9 10e3/uL   Eosinophils Absolute 0.1 0.0 - 0.5 10e3/uL   Basophils Absolute 0.0 0.0 - 0.1 10e3/uL   NEUT% 80.3 (H) 38.4 - 76.8 %   LYMPH% 13.4 (L) 14.0 - 49.7 %   MONO% 5.4 0.0 - 14.0 %   EOS% 0.7 0.0 - 7.0 %   BASO% 0.2 0.0 - 2.0 %  Comprehensive metabolic panel     Status: Abnormal   Collection Time: 07/23/16  2:56 PM  Result Value Ref Range   Sodium 138 136 - 145 mEq/L   Potassium 4.0 3.5 - 5.1 mEq/L   Chloride 102 98 - 109 mEq/L   CO2 27 22 - 29 mEq/L  Glucose 103 70 - 140 mg/dl    Comment: Glucose reference range is for nonfasting patients. Fasting glucose reference range is 70- 100.   BUN 9.1 7.0 - 26.0 mg/dL   Creatinine 0.7 0.6 - 1.1 mg/dL   Total Bilirubin 0.46 0.20 - 1.20 mg/dL   Alkaline Phosphatase 52 40 - 150 U/L   AST 27 5 - 34 U/L   ALT 12 0 - 55 U/L   Total Protein 7.4 6.4 - 8.3 g/dL   Albumin 3.6 3.5 - 5.0 g/dL   Calcium 10.5 (H) 8.4 - 10.4 mg/dL   Anion Gap 9 3 - 11 mEq/L   EGFR 86 (L) >90 ml/min/1.73 m2    Comment: eGFR is calculated using the CKD-EPI Creatinine Equation (2009)    RADIOGRAPHIC STUDIES: I have personally reviewed the radiological images as listed and agreed with the findings in the report. Ct Chest W Contrast  Result Date: 07/23/2016 CLINICAL DATA:  Recurrent endometrial cancer. EXAM: CT CHEST, ABDOMEN, AND PELVIS WITH CONTRAST TECHNIQUE: Multidetector CT imaging of the chest, abdomen and pelvis was performed following the standard protocol during bolus administration of intravenous contrast. CONTRAST:  129m ISOVUE-300 IOPAMIDOL (ISOVUE-300) INJECTION 61% COMPARISON:  08/02/2015.  MR lumbar spine 05/01/2016. FINDINGS: CT CHEST FINDINGS Cardiovascular: Although this study was not optimized for the detection of pulmonary emboli, there is a filling defect in the left upper lobe pulmonary artery, at the lobar level. Minimal atherosclerotic calcification of the aorta. Heart size normal. No pericardial effusion.  Mediastinum/Nodes: No pathologically enlarged mediastinal, hilar or axillary lymph nodes. Esophagus is grossly unremarkable. Lungs/Pleura: Mild mosaic pulmonary parenchymal attenuation in the lungs is nonspecific and may be due to expiratory phase imaging. Lungs are otherwise clear. No pleural fluid. Airway is unremarkable. Musculoskeletal: No worrisome lytic or sclerotic lesions. Degenerative changes are seen in the spine. CT ABDOMEN PELVIS FINDINGS Hepatobiliary: Liver is unremarkable. Cholecystectomy. No biliary ductal dilatation. Pancreas: Negative. Spleen: Negative. Adrenals/Urinary Tract: Adrenal glands are unremarkable. Sub cm low-attenuation lesion in the right kidney is too small to characterize. Left kidney is unremarkable. Ureters are decompressed. Bladder is unremarkable. Stomach/Bowel: Stomach, small bowel, appendix and colon are unremarkable. Vascular/Lymphatic: Atherosclerotic calcification of the arterial vasculature. No pathologically enlarged lymph nodes. Reproductive: Hysterectomy.  No adnexal mass. Other: No free fluid. Mesenteries and peritoneum are unremarkable. Small left inguinal hernia contains fat. Musculoskeletal: There are new postoperative changes of L1-L5 fusion, across an L3 compression fracture. L1 compression fracture is new in the interval. IMPRESSION: 1. Although this study was not optimized for the detection of pulmonary emboli, there is a filling defect in the left upper lobe pulmonary artery, indicative of pulmonary embolus. Critical Value/emergent results were called by telephone at the time of interpretation on 07/23/2016 at 10:57 am to Dr. PImagene GurneyIN [facility], who verbally acknowledged these results. 2. No evidence of metastatic disease. 3. Hepatic steatosis. 4. New L1 compression fracture. 5.  Aortic atherosclerosis (ICD10-170.0). Electronically Signed   By: MLorin PicketM.D.   On: 07/23/2016 11:00   Ct Abdomen Pelvis W Contrast  Result Date: 07/23/2016 CLINICAL DATA:   Recurrent endometrial cancer. EXAM: CT CHEST, ABDOMEN, AND PELVIS WITH CONTRAST TECHNIQUE: Multidetector CT imaging of the chest, abdomen and pelvis was performed following the standard protocol during bolus administration of intravenous contrast. CONTRAST:  1087mISOVUE-300 IOPAMIDOL (ISOVUE-300) INJECTION 61% COMPARISON:  08/02/2015.  MR lumbar spine 05/01/2016. FINDINGS: CT CHEST FINDINGS Cardiovascular: Although this study was not optimized for the detection of pulmonary emboli, there is a  filling defect in the left upper lobe pulmonary artery, at the lobar level. Minimal atherosclerotic calcification of the aorta. Heart size normal. No pericardial effusion. Mediastinum/Nodes: No pathologically enlarged mediastinal, hilar or axillary lymph nodes. Esophagus is grossly unremarkable. Lungs/Pleura: Mild mosaic pulmonary parenchymal attenuation in the lungs is nonspecific and may be due to expiratory phase imaging. Lungs are otherwise clear. No pleural fluid. Airway is unremarkable. Musculoskeletal: No worrisome lytic or sclerotic lesions. Degenerative changes are seen in the spine. CT ABDOMEN PELVIS FINDINGS Hepatobiliary: Liver is unremarkable. Cholecystectomy. No biliary ductal dilatation. Pancreas: Negative. Spleen: Negative. Adrenals/Urinary Tract: Adrenal glands are unremarkable. Sub cm low-attenuation lesion in the right kidney is too small to characterize. Left kidney is unremarkable. Ureters are decompressed. Bladder is unremarkable. Stomach/Bowel: Stomach, small bowel, appendix and colon are unremarkable. Vascular/Lymphatic: Atherosclerotic calcification of the arterial vasculature. No pathologically enlarged lymph nodes. Reproductive: Hysterectomy.  No adnexal mass. Other: No free fluid. Mesenteries and peritoneum are unremarkable. Small left inguinal hernia contains fat. Musculoskeletal: There are new postoperative changes of L1-L5 fusion, across an L3 compression fracture. L1 compression fracture is new  in the interval. IMPRESSION: 1. Although this study was not optimized for the detection of pulmonary emboli, there is a filling defect in the left upper lobe pulmonary artery, indicative of pulmonary embolus. Critical Value/emergent results were called by telephone at the time of interpretation on 07/23/2016 at 10:57 am to Dr. Imagene Gurney IN [facility] , who verbally acknowledged these results. 2. No evidence of metastatic disease. 3. Hepatic steatosis. 4. New L1 compression fracture. 5.  Aortic atherosclerosis (ICD10-170.0). Electronically Signed   By: Lorin Picket M.D.   On: 07/23/2016 11:00    ASSESSMENT:   75 year old female with past medical history of DVT in 1997 after a hairline fracture, endometrial cancer with pelvic nodal recurrence, status post surgery and radiation, no evidence disease currently, presented with recurrent right LE DVT and  PE, which was found about 1.5 months after her lumbar spine surgery.   1. Recurrent right LE DVT and PE, provoked   Hypercoagulopathy workup We discussed about the pros and cons about testing for thrombophilia disorder. her current anticoagulation therapy will interfere with some the tests and it is not possible to interpret the test results. Taking her off the anticoagulation therapy to do the tests may precipitate another thrombotic event. I recommend her to have hypercoagulation work up one month after she completes anticoagulation therapy.   Duration of anticoagulation I think her DVT and PE was related to the immobility after her lumbar spine surgery. This is likely provoked. This is second episode of thrombosis, I recommend continue Eliquis for 6 months. I recommend her to have hypercoagulation workup after she completes 6 months of therapy. If he does have hypercoagulopathy, I would recommend continue anticoagulation indefinitely.   Anticoagulation options We discussed about various options of anticoagulation therapies including warfarin, low molecular  weight heparin such as Lovenox or newer agents such as Rivaroxaban. Some of the risks and benefits discussed including costs involved, the need for monitoring, risks of life-threatening bleeding/hospitalization, reversibility of each agent in the event of bleeding or overdose, safety profile of each drug and taking into account other social issues such as ease of administration of medications, etc. Ultimately, we have made an informed decision for the patient to continue her treatment with Eliquis  Other preventive strategy for DVT  I recommend the patient to use elastic compression stockings at 20-30 mmHg to reduce risks of chronic thrombophlebitis.  Finally, at the  end of our consultation today, I reinforced the importance of preventive strategies such as avoiding hormonal supplement, avoiding cigarette smoking, keeping up-to-date with screening programs for early cancer detection, frequent ambulation for long distance travel and aggressive DVT prophylaxis in all surgical settings.  Family counseling Another main issue we discussed today included the role of screening other family members for thrombophilia disorder. At present time, I would not recommend testing the patient's family members as it would not benefit them. If her hypercoagulaton workup turns out to be positive, and her sibling Be tested, she does not have any children   Follow up -I'll see him back in 7 months with lab one week before. I probably will take him off Eliquis after 6 months of therapy (until the end of May 2018), and complete the hypercoagulable workup 1 months after that.  All questions were answered. The patient knows to call the clinic with any problems, questions or concerns. I spent 40 minutes counseling the patient face to face. The total time spent in the appointment was 45 minutes and more than 50% was on counseling.     Truitt Merle, MD 08/07/2016 3:43 PM

## 2016-08-08 ENCOUNTER — Other Ambulatory Visit: Payer: Self-pay | Admitting: *Deleted

## 2016-08-08 ENCOUNTER — Telehealth: Payer: Self-pay | Admitting: Gynecologic Oncology

## 2016-08-08 DIAGNOSIS — I2699 Other pulmonary embolism without acute cor pulmonale: Secondary | ICD-10-CM

## 2016-08-08 MED ORDER — APIXABAN 5 MG PO TABS: 5.0000 mg | ORAL_TABLET | Freq: Two times a day (BID) | ORAL | 0 refills | Status: DC

## 2016-08-08 MED FILL — ELIQUIS 5 MG TABLET: 5 | 30 days supply | Qty: 60 | Fill #0

## 2016-08-08 NOTE — Telephone Encounter (Signed)
Patient called stating she only has 4 tablets of Eliquis left and she was told by the pharmacy that she could not get a refill until Dec 9 Saturday.  The [facility] pharmacy is closed on Saturday and she will be going out of town.  Spoke with Janifer Adie, RN for Dr. Burr Medico who said she would look into the issue.

## 2016-08-08 NOTE — Telephone Encounter (Signed)
Received call from Joylene John NP today regarding refilling Eloquis & pt having difficulty getting filled due to insurance.  Discussed with [facility] & insurance not allowing refill until Sat 08/11/16.  Called [facility] @ [phone] to explain pt needed refill due to having to take loading dose for 1st week.  Asked for change in PA.  They were only allowing her to have 50 tabs in 30 days & per 1st script needed 74 pills in 30 days.  Also asked for vacation over ride since pt going out of town this weekend & needs script before then & only has 4 tabs left. Unable to get over ride b/c pharmacy needs to call.  Called [facility] & they will give her a free month supply with new script.  Notified pt of this.  Did receive PA from [employer] via fax stating 2.5 tablets/day approved through 02/06/17 under Medicare Part D Benefit

## 2016-08-09 ENCOUNTER — Encounter: Payer: Self-pay | Admitting: Hematology

## 2016-08-09 NOTE — Progress Notes (Signed)
Received approval from [employer] for Eliquis. Called [facility](Judy) to notify of the approval.   Eliquis approved 08/08/16- 02/06/17 under Medicare part D per [facility].

## 2016-08-22 ENCOUNTER — Other Ambulatory Visit (HOSPITAL_COMMUNITY)
Admission: RE | Admit: 2016-08-22 | Discharge: 2016-08-22 | Disposition: A | Discharge status: Home / Self Care | Payer: Medicare Other | Source: Ambulatory Visit | Attending: Gynecologic Oncology | Admitting: Gynecologic Oncology

## 2016-08-22 ENCOUNTER — Other Ambulatory Visit: Payer: Self-pay | Admitting: Gynecologic Oncology

## 2016-08-22 ENCOUNTER — Encounter: Payer: Self-pay | Admitting: Gynecologic Oncology

## 2016-08-22 ENCOUNTER — Ambulatory Visit: Payer: Medicare Other | Attending: Gynecologic Oncology | Admitting: Gynecologic Oncology

## 2016-08-22 VITALS — BP 131/46 | HR 98 | Temp 97.5°F | Resp 18 | Ht 63.0 in | Wt 163.6 lb

## 2016-08-22 DIAGNOSIS — Z9889 Other specified postprocedural states: Secondary | ICD-10-CM | POA: Diagnosis not present

## 2016-08-22 DIAGNOSIS — Z8542 Personal history of malignant neoplasm of other parts of uterus: Secondary | ICD-10-CM | POA: Diagnosis not present

## 2016-08-22 DIAGNOSIS — K219 Gastro-esophageal reflux disease without esophagitis: Secondary | ICD-10-CM | POA: Insufficient documentation

## 2016-08-22 DIAGNOSIS — Z923 Personal history of irradiation: Secondary | ICD-10-CM | POA: Insufficient documentation

## 2016-08-22 DIAGNOSIS — F419 Anxiety disorder, unspecified: Secondary | ICD-10-CM | POA: Diagnosis not present

## 2016-08-22 DIAGNOSIS — M4856XA Collapsed vertebra, not elsewhere classified, lumbar region, initial encounter for fracture: Secondary | ICD-10-CM | POA: Diagnosis not present

## 2016-08-22 DIAGNOSIS — Z90710 Acquired absence of both cervix and uterus: Secondary | ICD-10-CM | POA: Insufficient documentation

## 2016-08-22 DIAGNOSIS — Z79899 Other long term (current) drug therapy: Secondary | ICD-10-CM | POA: Diagnosis not present

## 2016-08-22 DIAGNOSIS — Z9049 Acquired absence of other specified parts of digestive tract: Secondary | ICD-10-CM | POA: Diagnosis not present

## 2016-08-22 DIAGNOSIS — I700 Atherosclerosis of aorta: Secondary | ICD-10-CM | POA: Insufficient documentation

## 2016-08-22 DIAGNOSIS — Z86718 Personal history of other venous thrombosis and embolism: Secondary | ICD-10-CM

## 2016-08-22 DIAGNOSIS — I2699 Other pulmonary embolism without acute cor pulmonale: Secondary | ICD-10-CM | POA: Diagnosis not present

## 2016-08-22 DIAGNOSIS — C541 Malignant neoplasm of endometrium: Secondary | ICD-10-CM | POA: Diagnosis not present

## 2016-08-22 DIAGNOSIS — K760 Fatty (change of) liver, not elsewhere classified: Secondary | ICD-10-CM | POA: Insufficient documentation

## 2016-08-22 DIAGNOSIS — Z01411 Encounter for gynecological examination (general) (routine) with abnormal findings: Secondary | ICD-10-CM | POA: Diagnosis present

## 2016-08-22 DIAGNOSIS — I10 Essential (primary) hypertension: Secondary | ICD-10-CM | POA: Insufficient documentation

## 2016-08-22 DIAGNOSIS — E785 Hyperlipidemia, unspecified: Secondary | ICD-10-CM | POA: Diagnosis not present

## 2016-08-22 DIAGNOSIS — I82409 Acute embolism and thrombosis of unspecified deep veins of unspecified lower extremity: Secondary | ICD-10-CM

## 2016-08-22 NOTE — Patient Instructions (Signed)
Happy 5 year Anniversary!!  Follow up with GYN ONC on an as needed basis.  Follow up with your other physicians and you will need a pelvic examination once a year.

## 2016-08-22 NOTE — Progress Notes (Signed)
Consult Note: Gyn-Onc  [name] 75 y.o. female  CC:  Chief Complaint  Patient presents with  . endometrial cancer    HPI The patient returns for her scheduled followup.   Patient was initially diagnosed endometrial cancer in June 2009. She underwent a TAH BSO. 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 (Stage IA grade 1). Patient was followed until a nodule in the upper vagina was noted and confirmed by biopsy in September 2012 to be recurrent disease. As further staging the patient had a PET scan in October 2012 which showed hypermetabolic pelvic lymph nodes. The right external iliac node measuring 1.9 cm and a left posterior obturator node measured 0.5 cm. The patient was then treated with whole pelvis radiation therapy and vaginal brachytherapy. Followup PET scan showed resolution of the pelvic adenopathy, however, a new periaortic lymph node was identified. Subsequently the periaortic chain was radiated. She tolerated this therapy well. She was seen by Dr. Loletta SpecterDianah Field November 2013. She had PET scan in November that was negative for recurrent disease.   CT 07/21/14 scan revealed:  IMPRESSION: 1. Stable CT. No evidence for residual tumor or metastatic disease within the chest abdomen or pelvis.  Pap smears in June and October 2015 were both negative.  Last colonoscopy: 05/07/14: She had a tubular adenoma resected at the ileocecal valve. There is no high-grade dysplasia or malignancy identified. Last MMG: 9/15, negative  I last saw her 2/16 and Dr. Sondra Come last saw her 6/16.  CT 08/02/15: IMPRESSION: 1. Status post hysterectomy. 2. No evidence of recurrent or metastatic disease within the chest, abdomen, or pelvis. 3. Pelvic floor laxity  Interval History:  I last saw her in December of last year which time her exam and Pap smear were unremarkable. She was in her usual state of health until she was recently seen  by Dr. Fermin Schwab in November and diagnosed with a new DVT/PE and was started on Eliquis. She has been seen by both hematology as well as the vein specialist. This DVT was provoked in the setting of relative immobility after back surgery. She's overall doing quite well and has no complaints. She is wearing her compression stockings which is helping immensely with her swelling.  CT 07/23/16: IMPRESSION: 1. Although this study was not optimized for the detection of pulmonary emboli, there is a filling defect in the left upper lobe pulmonary artery, indicative of pulmonary embolus. Critical 2. No evidence of metastatic disease. 3. Hepatic steatosis. 4. New L1 compression fracture. 5.  Aortic atherosclerosis (ICD10-170.0).  Review of Systems: Constitutional:  Denies fever. Skin: No rash Cardiovascular: No chest pain, shortness of breath, or edema  Pulmonary: No cough, occ wheeze as above.  Gastro Intestinal:  No nausea, vomiting, constipation, or diarrhea reported.  Genitourinary: No frequency, urgency, or dysuria.  Denies vaginal bleeding and discharge.  Musculoskeletal: Was having lower extremity edema that is now resolved with her compression stockings. Neurologic: No weakness, numbness, or change in gait.  Psychology: No changes  Current Meds:  Outpatient Encounter Prescriptions as of 08/22/2016  Medication Sig  . acetaminophen (TYLENOL) 500 MG tablet Take 500-1,000 mg by mouth every 8 (eight) hours as needed for mild pain or moderate pain.  Marland Kitchen apixaban (ELIQUIS) 5 MG TABS tablet Take 1 tablet (5 mg total) by mouth 2 (two) times daily. 10 mg twice daily for 7 days then 5 mg twice daily  . Calcium Carbonate (CALTRATE 600 PO) Take 1 tablet by mouth  3 (three) times daily.   . calcium carbonate (TUMS - DOSED IN MG ELEMENTAL CALCIUM) 500 MG chewable tablet Chew 1 tablet by mouth every 8 (eight) hours as needed for heartburn.   . cholecalciferol (VITAMIN D) 1000 UNITS tablet Take 1,000 Units  by mouth daily.    . CRESTOR 40 MG tablet 20 mg.   . esomeprazole (NEXIUM) 20 MG capsule Take 20 mg by mouth daily.   Marland Kitchen glucosamine-chondroitin 500-400 MG tablet Take 1 tablet by mouth daily.  . Multiple Vitamin (DAILY VALUE MULTIVITAMIN PO) Take 1 tablet by mouth daily.  . propranolol ER (INDERAL LA) 80 MG 24 hr capsule Take 80 mg by mouth daily.   . psyllium (METAMUCIL) 58.6 % powder Take 1 packet by mouth daily as needed.   . risedronate (ACTONEL) 35 MG tablet   . vitamin C (ASCORBIC ACID) 500 MG tablet Take 500 mg by mouth daily.    . vitamin E (VITAMIN E) 400 UNIT capsule Take 400 Units by mouth daily.  Marland Kitchen zinc gluconate 50 MG tablet Take 50 mg by mouth daily.    Marland Kitchen docusate sodium (COLACE) 100 MG capsule Take 1 capsule (100 mg total) by mouth 2 (two) times daily. (Patient not taking: Reported on 08/22/2016)  . gabapentin (NEURONTIN) 300 MG capsule Take 1 capsule (300 mg total) by mouth 3 (three) times daily. (Patient not taking: Reported on 08/22/2016)   No facility-administered encounter medications on file as of 08/22/2016.     Allergy: No Known Allergies  Social Hx:   Social History   Social History  . Marital status: Single    Spouse name: N/A  . Number of children: N/A  . Years of education: N/A   Occupational History  . Not on file.   Social History Main Topics  . Smoking status: Never Smoker  . Smokeless tobacco: Never Used  . Alcohol use No  . Drug use: No  . Sexual activity: No   Other Topics Concern  . Not on file   Social History Narrative  . No narrative on file    Past Surgical Hx:  Past Surgical History:  Procedure Laterality Date  . ANTERIOR LATERAL LUMBAR FUSION 4 LEVELS Left 05/03/2016   Procedure: Lumbar three Corpectomy  with Lumbar one to lumbar five fixation fusion with percutaneous pedicle screws: left hemilaminectomy open reduction fracture fragment;  Surgeon: Kevan Ny Ditty, MD;  Location: [facility];  Service: Neurosurgery;   Laterality: Left;  . CHOLECYSTECTOMY    . EYE SURGERY    . LUMBAR PERCUTANEOUS PEDICLE SCREW 4 LEVEL Bilateral 05/03/2016   Procedure: LUMBAR PERCUTANEOUS PEDICLE SCREW 4 LEVEL;  Surgeon: Kevan Ny Ditty, MD;  Location: [facility];  Service: Neurosurgery;  Laterality: Bilateral;  . TOTAL ABDOMINAL HYSTERECTOMY W/ BILATERAL SALPINGOOPHORECTOMY  2009    Past Medical Hx:  Past Medical History:  Diagnosis Date  . Anxiety   . Arthritis   . DVT (deep venous thrombosis) ([place])   . Endometrial cancer ([place]) dx'd 01/2008   recurrences 08/2011; 01/2012; xrt comp 03/2012  . GERD (gastroesophageal reflux disease)   . Hyperlipidemia   . Hypertension   . Migraines   . Radiation 10/2011   External Beam/Intracavitary  . S/P radiation therapy 01/29/12 - 03/10/12   Periaortic area/ 5600 cgy/ 28 Fractions    Oncology Hx:    Endometrial cancer ([place])    Initial Diagnosis    Endometrial cancer      02/14/2008 Surgery    TAH/BSO IA  grade 1      05/17/2011 Relapse/Recurrence    + vaginal recurrence, + Pelvic node recurrence       - 07/17/2011 Radiation Therapy    Pelvic and  vaginal brachytherapy completed       Relapse/Recurrence    PA node recurrence       - 03/15/2012 Radiation Therapy          Family Hx:  Family History  Problem Relation Age of Onset  . Heart failure Mother   . Ulcers Father     Vitals:  Blood pressure (!) 131/46, pulse 98, temperature 97.5 F (36.4 C), temperature source Oral, resp. rate 18, height 5\' 3"  (1.6 m), weight 163 lb 9.6 oz (74.2 kg), SpO2 100 %.  Physical Exam:  Well-nourished well-developed female in no acute distress.  Neck: Supple, no lymphadenopathy no thyromegaly.  Lungs: Clear to auscultation bilaterally.  Cardiovascular: Regular rate and rhythm.  Abdomen: Well-healed vertical midline incision. Abdomen is protuberant. There's no fluid wave there is no obvious masses. There is some voluntary guarding. There is no appreciable hernia.    Groins: No lymphadenopathy.  Extremities: No edema.  Pelvic: Normal external female genitalia. Positive cystocele. The vaginal cuff is without lesions. + cystocoele. ThinPrep Pap was submitted without difficulty. Bimanual examination reveals no masses or nodularity. Rectal confirms + rectocoele, no nodularity or masses.  Assessment/Plan:  75 year old with history of a stage IA grade 1 endometrial carcinoma diagnosed in 2009 who suffered a recurrence of the vaginal cuff is well as para-aotic lymph nodes in 2012. By exam today she has no evidence of recurrent disease and she had negative CT in November for disease. She is currently under care for an acute DVT PE which was most likely provoked in the setting of recent surgery and immobilization. She is 5 years out from her recurrence. We will follow-up in results for Pap smear from today. If it is unremarkable, she will be released from our clinic. She knows that we'll be happy to see her in the future should the need arise.    Telford Archambeau A., MD 08/22/2016, 10:17 AM

## 2016-08-30 ENCOUNTER — Telehealth: Payer: Self-pay | Admitting: Gynecologic Oncology

## 2016-08-30 NOTE — Telephone Encounter (Signed)
Pt notified about pap results: negative.  No questions or concerns voiced. 

## 2016-09-03 LAB — CYTOLOGY - PAP: DIAGNOSIS: NEGATIVE

## 2016-09-07 MED FILL — ELIQUIS 5 MG TABLET: 5 | 30 days supply | Qty: 60 | Fill #1

## 2016-10-09 MED FILL — ELIQUIS 5 MG TABLET: 5 | 30 days supply | Qty: 60 | Fill #2

## 2016-10-30 DIAGNOSIS — K219 Gastro-esophageal reflux disease without esophagitis: Secondary | ICD-10-CM | POA: Diagnosis present

## 2016-11-09 MED FILL — ELIQUIS 5 MG TABLET: 5 | 25 days supply | Qty: 50 | Fill #3

## 2016-12-04 MED FILL — ELIQUIS 5 MG TABLET: 5 | 30 days supply | Qty: 60 | Fill #4

## 2017-01-04 MED FILL — ELIQUIS 5 MG TABLET: 5 | 30 days supply | Qty: 60 | Fill #5

## 2017-02-04 ENCOUNTER — Other Ambulatory Visit: Payer: Self-pay | Admitting: Hematology

## 2017-02-04 DIAGNOSIS — I2699 Other pulmonary embolism without acute cor pulmonale: Secondary | ICD-10-CM

## 2017-02-04 MED FILL — ELIQUIS 5 MG TABLET: 5 | 5 days supply | Qty: 10 | Fill #6

## 2017-02-06 ENCOUNTER — Other Ambulatory Visit: Payer: Self-pay | Admitting: Gynecologic Oncology

## 2017-02-06 DIAGNOSIS — I2699 Other pulmonary embolism without acute cor pulmonale: Secondary | ICD-10-CM

## 2017-02-07 ENCOUNTER — Encounter: Payer: Self-pay | Admitting: Radiation Oncology

## 2017-02-07 ENCOUNTER — Ambulatory Visit
Admission: RE | Admit: 2017-02-07 | Discharge: 2017-02-07 | Disposition: A | Discharge status: Home / Self Care | Payer: Medicare Other | Source: Ambulatory Visit | Attending: Radiation Oncology | Admitting: Radiation Oncology

## 2017-02-07 VITALS — BP 116/93 | HR 54 | Temp 98.1°F | Ht 63.0 in | Wt 170.2 lb

## 2017-02-07 DIAGNOSIS — M545 Low back pain: Secondary | ICD-10-CM | POA: Insufficient documentation

## 2017-02-07 DIAGNOSIS — C541 Malignant neoplasm of endometrium: Secondary | ICD-10-CM | POA: Insufficient documentation

## 2017-02-07 DIAGNOSIS — G8929 Other chronic pain: Secondary | ICD-10-CM | POA: Insufficient documentation

## 2017-02-07 DIAGNOSIS — Z79899 Other long term (current) drug therapy: Secondary | ICD-10-CM | POA: Diagnosis not present

## 2017-02-07 DIAGNOSIS — C549 Malignant neoplasm of corpus uteri, unspecified: Secondary | ICD-10-CM

## 2017-02-07 DIAGNOSIS — C772 Secondary and unspecified malignant neoplasm of intra-abdominal lymph nodes: Secondary | ICD-10-CM

## 2017-02-07 NOTE — Progress Notes (Signed)
Radiation Oncology         [phone]) [phone] ________________________________  Name: [name] MRN: [id number]  Date: 02/07/2017  DOB: May 06, 1941  Follow-Up Visit Note  CC: Altheimer, Legrand Como, MD  Marti Sleigh  Diagnosis:   [name] is a 76 y.o. 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  Interval Since Last Radiation:  5 years  1. External beam radiation therapy July 09, 2011 through August 16, 2011. 2. High-dose rate brachytherapy on January 19th, December 31st,     January 8th and January 15th. (2013) 3. 01/29/2012 through 03/10/2012: Periaortic area at 5600 cGy in 28 fractions  Narrative:  The patient returns today for routine follow-up. She reports chronic lower back pain. She denies bladder issues. She reports mild incontinence of her bowels. She manages this with Metamucil. She denies vaginal bleeding or discharge. She denies fatigue.  The patient had a Pap smear on 08/22/16 by Dr. Alycia Rossetti which was negative for malignancy.  ALLERGIES:  has No Known Allergies.  Meds: Current Outpatient Prescriptions  Medication Sig Dispense Refill  . acetaminophen (TYLENOL) 500 MG tablet Take 500-1,000 mg by mouth every 8 (eight) hours as needed for mild pain or moderate pain.    Marland Kitchen Alum Hydroxide-Mag Trisilicate 70-62.3 MG CHEW Chew by mouth.    Marland Kitchen apixaban (ELIQUIS) 5 MG TABS tablet Take 1 tablet (5 mg total) by mouth 2 (two) times daily. 10 mg twice daily for 7 days then 5 mg twice daily 60 tablet 0  . Calcium Carbonate (CALTRATE 600 PO) Take 1 tablet by mouth 3 (three) times daily.     . calcium carbonate (TUMS - DOSED IN MG ELEMENTAL CALCIUM) 500 MG chewable tablet Chew 1 tablet by mouth every 8 (eight) hours as needed for heartburn.     . cholecalciferol (VITAMIN D) 1000 UNITS tablet Take 1,000 Units by mouth daily.      . Cholecalciferol (VITAMIN D-1000 MAX ST) 1000 units tablet  Take by mouth.    . CRESTOR 40 MG tablet 20 mg.     . esomeprazole (NEXIUM) 20 MG capsule Take 20 mg by mouth daily.     Marland Kitchen gabapentin (NEURONTIN) 300 MG capsule Take 1 capsule (300 mg total) by mouth 3 (three) times daily. 90 capsule 2  . glucosamine-chondroitin 500-400 MG tablet Take 1 tablet by mouth daily.    . Multiple Vitamin (DAILY VALUE MULTIVITAMIN PO) Take 1 tablet by mouth daily.    . propranolol ER (INDERAL LA) 80 MG 24 hr capsule Take 80 mg by mouth daily.     . psyllium (METAMUCIL) 58.6 % powder Take 1 packet by mouth daily as needed.     . risedronate (ACTONEL) 35 MG tablet     . vitamin C (ASCORBIC ACID) 500 MG tablet Take 500 mg by mouth daily.      . vitamin E (VITAMIN E) 400 UNIT capsule Take 400 Units by mouth daily.    Marland Kitchen zinc gluconate 50 MG tablet Take 50 mg by mouth daily.      Marland Kitchen docusate sodium (COLACE) 100 MG capsule Take 1 capsule (100 mg total) by mouth 2 (two) times daily. (Patient not taking: Reported on 08/22/2016) 10 capsule 0   No current facility-administered medications for this encounter.      Physical Findings:  Vitals:   02/07/17 0917  BP: (!) 116/93  Pulse: (!) 54  Temp: 98.1 F (36.7  C)  TempSrc: Oral  SpO2: 98%  Weight: 170 lb 3.2 oz (77.2 kg)  Height: 5\' 3"  (1.6 m)   The patient is in no acute distress. Patient is alert and oriented. No palpable supraclavicular or axillary adenopathy. The lungs are clear to auscultation. The heart has regular rhythm and rate. The abdomen is soft and nontender with normal bowel sounds. No inguinal adenopathy is appreciated.  On pelvic examination the external genitalia are unremarkable. Speculum exam is performed. No obvious mucosal lesions noted on exam. Radiation changes are noted in the proximal vagina. . On bimanual and rectovaginal examination there no pelvic masses appreciated. The patient appears to have a mild cystocele.. No inguinal adenopathy. Rectal Sphincter tone normal.  Lab Findings: Lab Results    Component Value Date   WBC 9.2 07/23/2016   HGB 12.3 07/23/2016   HCT 37.6 07/23/2016   MCV 91.9 07/23/2016   PLT 223 07/23/2016    Radiographic Findings: No results found.  Impression:  No evidence of recurrence on clinical exam today.  Plan: She will follow up with radiation oncology on a prn basis since she is out 5 years since her treatment. I congratulated her and wished her well. I offered a referral to physical therapy due to mild bowel incontinence. Patient denied a referral at this time.  ____________________________________  -----------------------------------  Blair Promise, PhD, MD  This document serves as a record of services personally performed by Gery Pray, MD. It was created on his behalf by Bethann Humble, a trained medical scribe. The creation of this record is based on the scribe's personal observations and the provider's statements to them. This document has been checked and approved by the attending provider.

## 2017-02-07 NOTE — Progress Notes (Signed)
Cambri is here for follow up.  She reports having chronic lower back pain.  She denies having any bladdier issues.  She reports having incontinence of her bowels.  She takes metamucil which helps.  She denies having any vaginal bleeding or discharge.  She denies having fatigue.  BP (!) 116/93 (BP Location: Right Arm, Patient Position: Sitting)   Pulse (!) 54   Temp 98.1 F (36.7 C) (Oral)   Ht 5\' 3"  (1.6 m)   Wt 170 lb 3.2 oz (77.2 kg)   SpO2 98%   BMI 30.15 kg/m    Wt Readings from Last 3 Encounters:  02/07/17 170 lb 3.2 oz (77.2 kg)  08/22/16 163 lb 9.6 oz (74.2 kg)  08/07/16 165 lb 9.6 oz (75.1 kg)

## 2017-02-14 ENCOUNTER — Other Ambulatory Visit: Payer: Self-pay | Admitting: Gynecologic Oncology

## 2017-02-14 DIAGNOSIS — I2699 Other pulmonary embolism without acute cor pulmonale: Secondary | ICD-10-CM

## 2017-02-27 ENCOUNTER — Other Ambulatory Visit: Payer: Self-pay | Admitting: *Deleted

## 2017-02-27 DIAGNOSIS — I82401 Acute embolism and thrombosis of unspecified deep veins of right lower extremity: Secondary | ICD-10-CM

## 2017-02-27 DIAGNOSIS — K0530 Chronic periodontitis, unspecified: Secondary | ICD-10-CM

## 2017-02-28 ENCOUNTER — Other Ambulatory Visit (HOSPITAL_BASED_OUTPATIENT_CLINIC_OR_DEPARTMENT_OTHER): Payer: Medicare Other

## 2017-02-28 DIAGNOSIS — I82401 Acute embolism and thrombosis of unspecified deep veins of right lower extremity: Secondary | ICD-10-CM

## 2017-02-28 DIAGNOSIS — I82411 Acute embolism and thrombosis of right femoral vein: Secondary | ICD-10-CM | POA: Diagnosis not present

## 2017-02-28 DIAGNOSIS — K0530 Chronic periodontitis, unspecified: Secondary | ICD-10-CM

## 2017-02-28 LAB — CBC WITH DIFFERENTIAL/PLATELET
BASO%: 0.3 % (ref 0.0–2.0)
Basophils Absolute: 0 10*3/uL (ref 0.0–0.1)
EOS%: 1.3 % (ref 0.0–7.0)
Eosinophils Absolute: 0.1 10*3/uL (ref 0.0–0.5)
HCT: 39.3 % (ref 34.8–46.6)
HGB: 13.1 g/dL (ref 11.6–15.9)
LYMPH#: 1.7 10*3/uL (ref 0.9–3.3)
LYMPH%: 28.8 % (ref 14.0–49.7)
MCH: 32 pg (ref 25.1–34.0)
MCHC: 33.3 g/dL (ref 31.5–36.0)
MCV: 96.1 fL (ref 79.5–101.0)
MONO#: 0.4 10*3/uL (ref 0.1–0.9)
MONO%: 6.3 % (ref 0.0–14.0)
NEUT#: 3.8 10*3/uL (ref 1.5–6.5)
NEUT%: 63.3 % (ref 38.4–76.8)
PLATELETS: 196 10*3/uL (ref 145–400)
RBC: 4.09 10*6/uL (ref 3.70–5.45)
RDW: 13.4 % (ref 11.2–14.5)
WBC: 6.1 10*3/uL (ref 3.9–10.3)

## 2017-02-28 LAB — COMPREHENSIVE METABOLIC PANEL
ALT: 14 U/L (ref 0–55)
ANION GAP: 7 meq/L (ref 3–11)
AST: 24 U/L (ref 5–34)
Albumin: 3.7 g/dL (ref 3.5–5.0)
Alkaline Phosphatase: 35 U/L — ABNORMAL LOW (ref 40–150)
BILIRUBIN TOTAL: 0.53 mg/dL (ref 0.20–1.20)
BUN: 13.9 mg/dL (ref 7.0–26.0)
CHLORIDE: 105 meq/L (ref 98–109)
CO2: 27 meq/L (ref 22–29)
CREATININE: 0.8 mg/dL (ref 0.6–1.1)
Calcium: 10.1 mg/dL (ref 8.4–10.4)
EGFR: 74 mL/min/{1.73_m2} — ABNORMAL LOW (ref >90)
GLUCOSE: 93 mg/dL (ref 70–140)
Potassium: 4.3 mEq/L (ref 3.5–5.1)
Sodium: 140 mEq/L (ref 136–145)
Total Protein: 6.7 g/dL (ref 6.4–8.3)

## 2017-03-01 LAB — PROTHROMBIN TIME (PT)
INR: 1 (ref 0.8–1.2)
Prothrombin Time: 10.7 s (ref 9.1–12.0)

## 2017-03-01 LAB — D-DIMER, QUANTITATIVE (NOT AT ARMC): D-DIMER: 0.92 mg{FEU}/L — AB (ref 0.00–0.49)

## 2017-03-01 LAB — PROTEIN S, ANTIGEN, FREE: PROTEIN S AG FREE: 110 % (ref 57–157)

## 2017-03-01 LAB — APTT: aPTT: 23 s — ABNORMAL LOW (ref 24–33)

## 2017-03-05 NOTE — Progress Notes (Signed)
[name]  [phone] [phone]  Clinic Follow Up Note   Patient Care Team: Altheimer, Legrand Como, MD as PCP - General (Endocrinology) Nancy Marus, MD as Attending Physician (Gynecologic Oncology) Truitt Merle, MD as Consulting Physician (Hematology) 03/07/2017   CHIEF COMPLAINTS:  Recurrent DVT and PE   HISTORY OF PRESENTING ILLNESS:  [name] 76 y.o. female is here because of her recent right DVT and PE. She was referred by her oncologist Dr. Alycia Rossetti for her anticoagulation management.   She had back surgery on 05/03/2016, she was not mobile for a few weeks, and was sent to SNF for rehab. She was finally sent home in October, able to move around, but not very physically active. In mid October, when she was trying to get into her call, she heard her right knee popped, and she developed right lower extremity swelling the next day, he was mild, no significant pain, she did not seek medical attention. 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 She was started on Eliquis, 10 mg twice daily for 7 days, then changed to 60m bid daily. She has been tolerating well, denies any episodes of bleeding or other noticeable side effects.  She had right leg DVT in 1997 after hairline fracture of right tibia, she was treated with coumadin for 6 months.   No family history of thrombophilia.   She was diagnosed with stage I endometrial cancer in June 2009, status post TAH and BSO. She subsequently had vagina and pelvic lymph node recurrence in 2012, she was treated with whole pelvic irradiation and vagina brachytherapy, post treatment PET scan was negative for residual disease. She did have periaortic lymph node recurrence,  which was reviewed again in July 2013. Her subsequent follow-up PET scan has showed no evidence of recurrence, including the most recent CT scan on 07/23/2016. She also does annual screening mammogram, no history of other malignancy.  She is single, no pregnancy. No family history of malignancy or blood clots   CURRENT THERAPY: she has stopped Xeloda in May 2018 after 6 months of treatment  INTERIM HISTORY:  She presents to the clinic today for follow up and review labs from last week. She has been doing fine. She has been having back pain but has surgery last August. She is currently off  blood thinner medication. Denies any breathing problems. She has no other concerns.   MEDICAL HISTORY:  Past Medical History:  Diagnosis Date  . Anxiety   . Arthritis   . [place]   . [place] 01[place]   recurrences 08/2011; 01/2012; xrt comp 03/2012  . GERD (gastroesophageal reflux disease)   . Hyperlipidemia   . Hypertension   . Migraines   . Radiation 10/2011   External Beam/Intracavitary  . S/P radiation therapy 01/29/12 - 03/10/12   Periaortic area/ 5600 cgy/ 28 Fractions    SURGICAL HISTORY: Past Surgical History:  Procedure Laterality Date  . ANTERIOR LATERAL LUMBAR FUSION 4 LEVELS Left 05/03/2016   Procedure: Lumbar three Corpectomy  with Lumbar one to lumbar five fixation fusion with percutaneous pedicle screws: left hemilaminectomy open reduction fracture fragment;  Surgeon: BKevan NyDitty, MD;  [facility];  Service: Neurosurgery;  Laterality: Left;  .  CHOLECYSTECTOMY    . EYE SURGERY    . LUMBAR PERCUTANEOUS PEDICLE SCREW 4 LEVEL Bilateral 05/03/2016   Procedure: LUMBAR PERCUTANEOUS PEDICLE SCREW 4 LEVEL;  Surgeon: Kevan Ny Ditty, MD;  Location: [facility];  Service: Neurosurgery;  Laterality: Bilateral;  . TOTAL ABDOMINAL HYSTERECTOMY W/ BILATERAL SALPINGOOPHORECTOMY  2009    SOCIAL HISTORY: Social History   Social  History  . Marital status: Single    Spouse name: N/A  . Number of children: N/A  . Years of education: N/A   Occupational History  . Not on file.   Social History Main Topics  . Smoking status: Never Smoker  . Smokeless tobacco: Never Used  . Alcohol use No  . Drug use: No  . Sexual activity: No   Other Topics Concern  . Not on file   Social History Narrative  . No narrative on file    FAMILY HISTORY: Family History  Problem Relation Age of Onset  . Heart failure Mother   . Ulcers Father     ALLERGIES:  has No Known Allergies.  MEDICATIONS:  Current Outpatient Prescriptions  Medication Sig Dispense Refill  . acetaminophen (TYLENOL) 500 MG tablet Take 500-1,000 mg by mouth every 8 (eight) hours as needed for mild pain or moderate pain.    Marland Kitchen Alum Hydroxide-Mag Trisilicate 16-10.9 MG CHEW Chew by mouth as needed.     Marland Kitchen apixaban (ELIQUIS) 5 MG TABS tablet Take 1 tablet (5 mg total) by mouth 2 (two) times daily. 10 mg twice daily for 7 days then 5 mg twice daily 60 tablet 0  . Calcium Carbonate (CALTRATE 600 PO) Take 1 tablet by mouth 3 (three) times daily.     . calcium carbonate (TUMS - DOSED IN MG ELEMENTAL CALCIUM) 500 MG chewable tablet Chew 1 tablet by mouth every 8 (eight) hours as needed for heartburn.     . cholecalciferol (VITAMIN D) 1000 UNITS tablet Take 1,000 Units by mouth daily.      . CRESTOR 40 MG tablet 20 mg.     . esomeprazole (NEXIUM) 20 MG capsule Take 20 mg by mouth daily as needed.     . gabapentin (NEURONTIN) 300 MG capsule Take 1 capsule (300 mg total) by mouth 3 (three) times daily. (Patient taking differently: Take 300 mg by mouth daily. ) 90 capsule 2  . glucosamine-chondroitin 500-400 MG tablet Take 1 tablet by mouth daily.    . Multiple Vitamin (DAILY VALUE MULTIVITAMIN PO) Take 1 tablet by mouth daily.    . propranolol ER (INDERAL LA) 80 MG 24 hr capsule Take 80 mg by mouth daily.     . psyllium (METAMUCIL) 58.6 % powder Take 1 packet by  mouth daily as needed.     . risedronate (ACTONEL) 35 MG tablet     . vitamin C (ASCORBIC ACID) 500 MG tablet Take 500 mg by mouth daily.      . vitamin E (VITAMIN E) 400 UNIT capsule Take 400 Units by mouth daily.    Marland Kitchen zinc gluconate 50 MG tablet Take 50 mg by mouth daily.       No current facility-administered medications for this visit.     REVIEW OF SYSTEMS:   Constitutional: Denies fevers, chills or abnormal night sweats Eyes: Denies blurriness of vision, double vision or watery eyes Ears, nose, mouth, throat, and face: Denies mucositis or sore throat Respiratory: Denies cough, dyspnea or wheezes Cardiovascular: Denies palpitation, chest discomfort or lower extremity swelling Gastrointestinal:  Denies  nausea, heartburn or change in bowel habits Skin: Denies abnormal skin rashes Lymphatics: Denies new lymphadenopathy or easy bruising Neurological:Denies numbness, tingling or new weaknesses Behavioral/Psych: Mood is stable, no new changes  MSK: (+)occasional back pain All other systems were reviewed with the patient and are negative.  PHYSICAL EXAMINATION:  ECOG PERFORMANCE STATUS: 1 - Symptomatic but completely ambulatory  Vitals:   03/07/17 1324  BP: (!) 137/57  Pulse: (!) 57  Resp: 18  Temp: 98.2 F (36.8 C)   Filed Weights   03/07/17 1324  Weight: 173 lb 12.8 oz (78.8 kg)    GENERAL:alert, no distress and comfortable SKIN: skin color, texture, turgor are normal, no rashes or significant lesions EYES: normal, conjunctiva are pink and non-injected, sclera clear OROPHARYNX:no exudate, no erythema and lips, buccal mucosa, and tongue normal  NECK: supple, thyroid normal size, non-tender, without nodularity LYMPH:  no palpable lymphadenopathy in the cervical, axillary or inguinal LUNGS: clear to auscultation and percussion with normal breathing effort HEART: regular rate & rhythm and no murmurs and no lower extremity edema ABDOMEN:abdomen soft, non-tender and normal  bowel sounds Musculoskeletal:no cyanosis of digits and no clubbing, Surgical incisions at the lumbar spine is well healed. She wears a spinal brace.  PSYCH: alert & oriented x 3 with fluent speech NEURO: no focal motor/sensory deficits  LABORATORY DATA:  I have reviewed the data as listed Recent Results (from the past 2160 hour(s))  CBC with Differential     Status: None   Collection Time: 02/28/17  1:10 PM  Result Value Ref Range   WBC 6.1 3.9 - 10.3 10e3/uL   NEUT# 3.8 1.5 - 6.5 10e3/uL   HGB 13.1 11.6 - 15.9 g/dL   HCT 39.3 34.8 - 46.6 %   Platelets 196 145 - 400 10e3/uL   MCV 96.1 79.5 - 101.0 fL   MCH 32.0 25.1 - 34.0 pg   MCHC 33.3 31.5 - 36.0 g/dL   RBC 4.09 3.70 - 5.45 10e6/uL   RDW 13.4 11.2 - 14.5 %   lymph# 1.7 0.9 - 3.3 10e3/uL   MONO# 0.4 0.1 - 0.9 10e3/uL   Eosinophils Absolute 0.1 0.0 - 0.5 10e3/uL   Basophils Absolute 0.0 0.0 - 0.1 10e3/uL   NEUT% 63.3 38.4 - 76.8 %   LYMPH% 28.8 14.0 - 49.7 %   MONO% 6.3 0.0 - 14.0 %   EOS% 1.3 0.0 - 7.0 %   BASO% 0.3 0.0 - 2.0 %  Comprehensive metabolic panel     Status: Abnormal   Collection Time: 02/28/17  1:10 PM  Result Value Ref Range   Sodium 140 136 - 145 mEq/L   Potassium 4.3 3.5 - 5.1 mEq/L   Chloride 105 98 - 109 mEq/L   CO2 27 22 - 29 mEq/L   Glucose 93 70 - 140 mg/dl    Comment: Glucose reference range is for nonfasting patients. Fasting glucose reference range is 70- 100.   BUN 13.9 7.0 - 26.0 mg/dL   Creatinine 0.8 0.6 - 1.1 mg/dL   Total Bilirubin 0.53 0.20 - 1.20 mg/dL   Alkaline Phosphatase 35 (L) 40 - 150 U/L   AST 24 5 - 34 U/L   ALT 14 0 - 55 U/L   Total Protein 6.7 6.4 - 8.3 g/dL   Albumin 3.7 3.5 - 5.0 g/dL   Calcium 10.1 8.4 - 10.4 mg/dL   Anion Gap 7 3 - 11 mEq/L   EGFR 74 (L) >90 ml/min/1.73 m2    Comment:  eGFR is calculated using the CKD-EPI Creatinine Equation (2009)  Protein S, Antigen, Free*     Status: None   Collection Time: 02/28/17  1:10 PM  Result Value Ref Range   Protein S,  Free 110 57 - 157 %    Comment: This test was developed and its performance characteristics determined by [facility]. It has not been cleared or approved by the Food and Drug Administration.   APTT     Status: Abnormal   Collection Time: 02/28/17  1:10 PM  Result Value Ref Range   aPTT 23 (L) 24 - 33 sec    Comment: This test has not been validated for monitoring unfractionated heparin therapy. aPTT-based therapeutic ranges for unfractionated heparin therapy have not been established. For general guidelines on Heparin monitoring, refer to the [employer].   D-dimer, quantitative     Status: Abnormal   Collection Time: 02/28/17  1:10 PM  Result Value Ref Range   D-DIMER 0.92 (H) 0.00 - 0.49 mg/L FEU    Comment: According to the assay manufacturer's published package insert, a normal (<0.50 mg/L FEU) D-dimer result in conjunction with a non-high clinical probability assessment, excludes deep vein thrombosis (DVT) and pulmonary embolism (PE) with high sensitivity. D-dimer values increase with age and this can make VTE exclusion of an older population difficult. To address this, the [place], based on best available evidence and recent guidelines, recommends that clinicians use age-adjusted D-dimer thresholds in patients greater than 33 years of age with: a) a low probability of PE who do not meet all Pulmonary Embolism Rule Out Criteria, or b) in those with intermediate probability of PE. The formula for an age-adjusted D-dimer cut-off is "age/100". For example, a 75 year old patient would have an age-adjusted cut-off of 0.60 mg/L FEU and an 75 year old 0.80 mg/L FEU.   Prothrombin Time (PT)     Status: None   Collection Time: 02/28/17  1:10 PM  Result Value Ref Range   INR 1.0 0.8 - 1.2    Comment:                Reference interval is for non-anticoagulated patients.                Suggested INR therapeutic range for Vitamin K                 antagonist therapy:                   Standard Dose (moderate intensity                                  therapeutic range):       2.0 - 3.0                   Higher intensity therapeutic range       2.5 - 3.5    Prothrombin Time 10.7 9.1 - 12.0 sec    RADIOGRAPHIC STUDIES: I have personally reviewed the radiological images as listed and agreed with the findings in the report. No results found.  ASSESSMENT:   76 year old female with past medical history of DVT in 1997 after a hairline fracture, endometrial cancer with pelvic nodal recurrence, status post surgery and radiation, no evidence disease currently, presented with recurrent right LE DVT and  PE, which was found about 1.5 months after her lumbar spine  surgery.    1. Recurrent right LE DVT and PE, provoked  -Her DVT and PE felt was related to her spine surgery -She has completed 6 months of anticoagulation with Xeloda, tolerated well. -She is off anticoagulation now. I did review the lab results, she still has a mildly elevated d-dimer, hypercoagulation panel was negative except the factor V Leyden mutation and prothrombin gene mutation results are still pending.  -I do not recommend prolonged anticoagulation, if the above gene mutation test were negative. -She'll follow-up with her primary care physician. I'll see her as needed in the future. -We again reviewed the strategy to prevent thrombosis. I recommend her to take baby aspirin, be physically active, do not smoke or use hormonal therapy. -I'll see her as needed in the future.   All questions were answered. The patient knows to call the clinic with any problems, questions or concerns. I spent 15 minutes counseling the patient face to face. The total time spent in the appointment was 20 minutes and more than 50% was on counseling.  This document serves as a record of services personally performed by Truitt Merle, MD. It was created on her behalf by Brandt Loosen, a trained medical  scribe. The creation of this record is based on the scribe's personal observations and the provider's statements to them. This document has been checked and approved by the attending provider.     Truitt Merle, MD 03/07/2017

## 2017-03-07 ENCOUNTER — Telehealth: Payer: Self-pay | Admitting: Hematology

## 2017-03-07 ENCOUNTER — Other Ambulatory Visit: Payer: Medicare Other

## 2017-03-07 ENCOUNTER — Ambulatory Visit: Payer: Medicare Other

## 2017-03-07 ENCOUNTER — Ambulatory Visit (HOSPITAL_BASED_OUTPATIENT_CLINIC_OR_DEPARTMENT_OTHER): Payer: Medicare Other | Admitting: Hematology

## 2017-03-07 VITALS — BP 137/57 | HR 57 | Temp 98.2°F | Resp 18 | Ht 63.0 in | Wt 173.8 lb

## 2017-03-07 DIAGNOSIS — I2699 Other pulmonary embolism without acute cor pulmonale: Secondary | ICD-10-CM

## 2017-03-07 DIAGNOSIS — I82401 Acute embolism and thrombosis of unspecified deep veins of right lower extremity: Secondary | ICD-10-CM | POA: Diagnosis not present

## 2017-03-07 DIAGNOSIS — M545 Low back pain: Secondary | ICD-10-CM | POA: Diagnosis not present

## 2017-03-07 NOTE — Telephone Encounter (Signed)
Scheduled appt per 7/5 los - f/u as needed - gave patient AVS

## 2017-03-08 LAB — LUPUS ANTICOAGULANT PANEL
PTT-LA: 28.6 s (ref 0.0–51.9)
dRVVT: 32.8 s (ref 0.0–47.0)

## 2017-03-08 LAB — ANTITHROMBIN III: ANTITHROMBIN ACTIVITY: 79 % (ref 75–135)

## 2017-03-08 LAB — PROTEIN S ACTIVITY: Protein S-Functional: 82 % (ref 63–140)

## 2017-03-08 LAB — CARDIOLIPIN ANTIBODIES, IGG, IGM, IGA
Anticardiolipin Ab,IgA,Qn: <9 APL U/mL (ref 0–11)
Anticardiolipin Ab,IgG,Qn: <9 GPL U/mL (ref 0–14)
Anticardiolipin Ab,IgM,Qn: <9 MPL U/mL (ref 0–12)

## 2017-03-08 LAB — PROTEIN C ACTIVITY: PROTEIN C ACTIVITY: 106 % (ref 73–180)

## 2017-03-08 LAB — PROTEIN S, TOTAL: Protein S, Total: 82 % (ref 60–150)

## 2017-03-09 ENCOUNTER — Encounter: Payer: Self-pay | Admitting: Hematology

## 2017-03-09 LAB — BETA-2-GLYCOPROTEIN I ABS, IGG/M/A
Beta-2 Glyco 1 IgA: <9 GPI IgA units (ref 0–25)
Beta-2 Glycoprotein I Ab, IgG: <9 GPI IgG units (ref 0–20)

## 2017-03-09 LAB — PROTEIN C, TOTAL: PROTEIN C ANTIGEN: 103 % (ref 60–150)

## 2017-03-11 LAB — FACTOR 5 LEIDEN

## 2017-03-13 LAB — PROTHROMBIN GENE MUTATION

## 2017-04-01 ENCOUNTER — Telehealth: Payer: Self-pay | Admitting: Hematology

## 2017-04-01 NOTE — Telephone Encounter (Signed)
I called pt and left a message on her home phone regarding her negative hypercoagulopathy workup. I'll see her as needed in the future, she knows to call me if she has questions.  Truitt Merle  04/01/2017

## 2018-10-29 ENCOUNTER — Other Ambulatory Visit: Payer: Self-pay

## 2018-10-30 ENCOUNTER — Other Ambulatory Visit: Payer: Self-pay

## 2018-10-30 MED ORDER — FUROSEMIDE 20 MG PO TABS: 20.0000 mg | ORAL_TABLET | Freq: Every day | ORAL | 1 refills | Status: DC

## 2018-11-17 ENCOUNTER — Encounter: Payer: Self-pay | Admitting: Cardiology

## 2018-11-17 DIAGNOSIS — Z0189 Encounter for other specified special examinations: Secondary | ICD-10-CM | POA: Insufficient documentation

## 2019-01-13 ENCOUNTER — Other Ambulatory Visit: Payer: Self-pay | Admitting: Cardiology

## 2019-01-13 NOTE — Telephone Encounter (Signed)
Please fill

## 2019-01-19 ENCOUNTER — Other Ambulatory Visit: Payer: Self-pay | Admitting: Cardiology

## 2019-01-19 NOTE — Telephone Encounter (Signed)
Please fill

## 2019-06-01 ENCOUNTER — Other Ambulatory Visit: Payer: Self-pay | Admitting: Cardiology

## 2019-09-24 NOTE — Progress Notes (Signed)
Follow up visit  Subjective:   [name], female    DOB: [birth year], 79 y.o.   MRN: [id number]   HPI  Chief Complaint  Patient presents with  . Follow-up    60 year    79 year old Caucasian female with history of endometrial cancer, currently in remission, recurrent history of DVT in 1997 and 2017, history of PE in 2017.  Patient has been taking lasix with resolution of leg edema. She denies chest pain, shortness of breath, palpitations, leg edema, orthopnea, PND, TIA/syncope. She received her first dose of COVID vaccine yesterday.   Current Outpatient Medications on File Prior to Visit  Medication Sig Dispense Refill  . acetaminophen (TYLENOL) 500 MG tablet Take 500-1,000 mg by mouth every 8 (eight) hours as needed for mild pain or moderate pain.    Marland Kitchen Alum Hydroxide-Mag Trisilicate [id number] MG CHEW Chew by mouth as needed.     . Calcium Carbonate (CALTRATE 600 PO) Take 1 tablet by mouth 3 (three) times daily.     . calcium carbonate (TUMS - DOSED IN MG ELEMENTAL CALCIUM) 500 MG chewable tablet Chew 1 tablet by mouth every 8 (eight) hours as needed for heartburn.     . cholecalciferol (VITAMIN D) 1000 UNITS tablet Take 1,000 Units by mouth daily.      . CRESTOR 40 MG tablet 20 mg.     . ELIQUIS 2.5 MG TABS tablet TAKE 1 TABLET BY MOUTH  TWICE A DAY 180 tablet 2  . esomeprazole (NEXIUM) 20 MG capsule Take 20 mg by mouth daily as needed.     . furosemide (LASIX) 20 MG tablet TAKE 1 TABLET BY MOUTH ONCE DAILY 90 tablet 3  . gabapentin (NEURONTIN) 300 MG capsule Take 1 capsule (300 mg total) by mouth 3 (three) times daily. (Patient taking differently: Take 300 mg by mouth daily. ) 90 capsule 2  . glucosamine-chondroitin 500-400 MG tablet Take 1 tablet by mouth daily.    . Multiple Vitamin (DAILY VALUE MULTIVITAMIN PO) Take 1 tablet by mouth daily.    . propranolol ER (INDERAL LA) 80 MG 24 hr capsule Take 80 mg by mouth daily.     . psyllium (METAMUCIL) 58.6 % powder Take 1 packet  by mouth daily as needed.     . risedronate (ACTONEL) 35 MG tablet     . vitamin C (ASCORBIC ACID) 500 MG tablet Take 500 mg by mouth daily.      . vitamin E (VITAMIN E) 400 UNIT capsule Take 400 Units by mouth daily.    Marland Kitchen zinc gluconate 50 MG tablet Take 50 mg by mouth daily.       No current facility-administered medications on file prior to visit.    Cardiovascular & other pertient studies:   EKG 09/25/2019: Sinus bradycardia 49 bpm.  Echocardiogram 08/20/2017: Left ventricle cavity is normal in size. Mild concentric hypertrophy of the left ventricle. Normal global wall motion. Doppler evidence of grade I (impaired) diastolic dysfunction, normal LAP. Calculated EF 64%. No significant valvular abnormality. No evidence of pulmonary hypertension.  Recent labs: No labs available.   Review of Systems  Cardiovascular: Negative for chest pain, dyspnea on exertion, leg swelling, palpitations and syncope.        Vitals:   09/25/19 0926  BP: (!) 142/73  Pulse: (!) 55  Resp: 18  Temp: (!) 96.5 F (35.8 C)  SpO2: 96%     Body mass index is 32.29 kg/m. Filed Weights   09/25/19 [phone]  Weight: 182 lb 4.8 oz (82.7 kg)     Objective:   Physical Exam  Constitutional: She appears well-developed and well-nourished.  Neck: No JVD present.  Cardiovascular: Normal rate, regular rhythm, normal heart sounds and intact distal pulses.  No murmur heard. Pulmonary/Chest: Effort normal and breath sounds normal. She has no wheezes. She has no rales.  Musculoskeletal:        General: No edema.  Nursing note and vitals reviewed.         Assessment & Recommendations:   79 year old Caucasian female with history of endometrial cancer, currently in remission, recurrent history of DVT in 1997 and 2017, history of PE in 2017  HFpEF: Resolved. Take lasix only as needed for leg edema.  History of PE: Low-dose Eliquis 2.5 mg twice daily.  Hypertension: She has been on propranolol  for a long time, initially started for migraine but continued for hypertension. This explains her asymptomatic sinus bradycardia. Okay to continue.  Hyperlipidemia: Continue Crestor  I have asked the patient to send [place] the labs that will be performed by Dr. Elyse Hsu later this year.  I congratulated her on getting COVID vaccine.   I will see her back in 1 year.   Nigel Mormon, MD [facility] Pager: [phone] Office: [phone]

## 2019-09-25 ENCOUNTER — Other Ambulatory Visit: Payer: Self-pay

## 2019-09-25 ENCOUNTER — Encounter: Payer: Self-pay | Admitting: Cardiology

## 2019-09-25 ENCOUNTER — Ambulatory Visit (INDEPENDENT_AMBULATORY_CARE_PROVIDER_SITE_OTHER): Payer: Medicare Other | Admitting: Cardiology

## 2019-09-25 VITALS — BP 142/73 | HR 55 | Temp 96.5°F | Resp 18 | Ht 63.0 in | Wt 182.3 lb

## 2019-09-25 DIAGNOSIS — R001 Bradycardia, unspecified: Secondary | ICD-10-CM

## 2019-09-25 DIAGNOSIS — I10 Essential (primary) hypertension: Secondary | ICD-10-CM | POA: Diagnosis not present

## 2019-09-25 DIAGNOSIS — E782 Mixed hyperlipidemia: Secondary | ICD-10-CM | POA: Diagnosis not present

## 2019-11-01 ENCOUNTER — Other Ambulatory Visit: Payer: Self-pay | Admitting: Cardiology

## 2020-01-11 ENCOUNTER — Other Ambulatory Visit (HOSPITAL_COMMUNITY): Payer: Self-pay | Admitting: Obstetrics and Gynecology

## 2020-01-11 ENCOUNTER — Other Ambulatory Visit: Payer: Self-pay | Admitting: Obstetrics and Gynecology

## 2020-01-11 DIAGNOSIS — Z8542 Personal history of malignant neoplasm of other parts of uterus: Secondary | ICD-10-CM

## 2020-01-11 DIAGNOSIS — R1030 Lower abdominal pain, unspecified: Secondary | ICD-10-CM

## 2020-01-15 ENCOUNTER — Ambulatory Visit (HOSPITAL_COMMUNITY)
Admission: RE | Admit: 2020-01-15 | Discharge: 2020-01-15 | Disposition: A | Discharge status: Home / Self Care | Payer: Medicare Other | Source: Ambulatory Visit | Attending: Obstetrics and Gynecology | Admitting: Obstetrics and Gynecology

## 2020-01-15 ENCOUNTER — Other Ambulatory Visit: Payer: Self-pay

## 2020-01-15 DIAGNOSIS — R1030 Lower abdominal pain, unspecified: Secondary | ICD-10-CM | POA: Diagnosis not present

## 2020-01-15 DIAGNOSIS — Z8542 Personal history of malignant neoplasm of other parts of uterus: Secondary | ICD-10-CM | POA: Diagnosis present

## 2020-01-15 MED ORDER — SODIUM CHLORIDE (PF) 0.9 % IJ SOLN
INTRAMUSCULAR | Status: AC
  Filled 2020-01-15: qty 50

## 2020-01-15 MED ORDER — IOHEXOL 300 MG/ML  SOLN
100.0000 mL | Freq: Once | INTRAMUSCULAR | Status: AC | PRN
  Administered 2020-01-15: 100 mL via INTRAVENOUS

## 2020-02-17 ENCOUNTER — Encounter: Payer: Self-pay | Admitting: Gynecologic Oncology

## 2020-02-17 NOTE — Progress Notes (Signed)
GYNECOLOGIC ONCOLOGY NEW PATIENT CONSULTATION   Patient Name: [name]  Patient Age: 79 y.o. Date of Service: 02/18/20 Referring Provider: Dr. Ulanda Edison  Primary Care Provider: Altheimer, Legrand Como, MD Consulting Provider: Jeral Pinch, MD   Assessment/Plan:  Postmenopausal patient with a history of uterine cancer last treated for recurrence in 2013 who has now been NED for approximately 8 years with recent onset of pelvic pain that seems likely secondary to pelvic organ prolapse/cystocele.  Reviewed recent CT findings with the patient in detail as well as her exam today.  From a uterine cancer history standpoint, she is NED.  Her symptoms seem most likely to be related to her bladder.  She notes some relief of symptoms when she empties her bladder.  I discussed with her timed voids every 2-3 hours to hopefully prevent her pressure sensation from increasing.  Given her pelvic organ prolapse and cystocele, I also think it would be worth having her see a urogynecologist or urologist to discuss treatment options.  It may be hard to fit her for a pessary given her shortened vaginal length after surgery and radiation, but I discussed topical estrogen use several times a week as this may make pessary fitting and placement more feasible.  While her CT scan also showed diverticulosis and a new lumbar back hernia, given the description of her symptoms, I think her pressure and pain is unrelated to these findings.  She may ultimately need to see someone for her hernia in the future.  In terms of her fecal incontinence, we discussed using fiber and bulking agents more regularly as I suspect this may help decrease her incontinence and number of accidents.  She has been taking Metamucil but not regularly.  I encourage this and gave her handout about high-fiber foods.  She has some vulvar erythema although no discrete lesions and no loss of architecture or changes to the skin otherwise.  This does not  appear to be lichen sclerosis.  She is already working on avoiding soaps, lotions and anything that could be causing an allergy or dermatitis.  I discussed that estrogen therapy may also help with her vulvar erythema and symptoms.  A copy of this note was sent to the patient's referring provider.   50 minutes of total time was spent for this patient encounter, including preparation, face-to-face counseling with the patient and coordination of care, and documentation of the encounter.   Jeral Pinch, MD  Division of Gynecologic Oncology  Department of Obstetrics and Gynecology  [facility]  ___________________________________________  Chief Complaint: Chief Complaint  Patient presents with  . Abnormal pelvic exam    New Patient    History of Present Illness:  [name] is a 79 y.o. y.o. female who is seen in consultation at the request of Dr. Ulanda Edison for an evaluation of abdominal pain in the setting of a remote history of uterine cancer.   The patient reports having pelvic pain starting in April.  Between April 18 and 24 she felt pressure on her bladder.  She thought she had a urinary tract infection but was seen and worked up for this and was not found to be infected.  The pain is intermittent and was initially daily, occasionally preventing her from sleeping at night.  Now, overall the pelvic pressure is better on some days and she has some days where she does not feel any symptoms.  She notes that the pressure is worse when she is sitting.  Emptying  her bladder provide some relief.  She denies any urinary incontinence, hematuria, or frequency. CT showed no evidence of recurrent cancer but shows diverticulosis and new lumbar hernia.   She denies any vaginal bleeding or discharge.  She reports having a good appetite without any nausea, emesis, early satiety.  She endorses some burning intermittently on her vulva that she thinks is related to the pressure  sensation she feels on her bladder.  She endorses a history of fecal incontinence as well as fecal urgency.  She has 1-3 well-formed daily bowel movements and intermittently uses Metamucil.  She notes having had at least several accidents in the recent past where she has had fecal incontinence and soiled her clothing.  She was diagnosed with stage IA grade 1 (with superficial MI, no LVSI) endometrial cancer 02/2008. Underwent TAH/BSO. She had a vaginal and pelvic lymph node recurrence in 2012 treated with pelvic radiation and vaginal brachytherapy. Post treatment PET scan was negative for residual disease. She did have para-aortic lymph node recurrence, treated with local radiation in 2013. Her subsequent follow-up PET scan has showed no evidence of recurrence, including the most recent CT scan on 07/23/2016. Most recent pap test with Dr. Alycia Rossetti in mid 2017 was negative. Last speculum exam in 02/2017 with Dr. Sondra Come was negative for any masses or lesions.   Adjuvant therapy: 1. External beam radiation therapy July 09, 2011 through August 16, 2011. 2. High-dose rate brachytherapy on January 19th, December 31st,     January 8th and January 15th. (2013) 3. 01/29/2012 through 03/10/2012: Periaortic area at 5600 cGy in 28 fractions  Treatment History: Oncology History  Endometrial cancer [facility])   Initial Diagnosis   Endometrial cancer   02/14/2008 Surgery   TAH/BSO IA grade 1   05/17/2011 Relapse/Recurrence   + vaginal recurrence, + Pelvic node recurrence    - 07/17/2011 Radiation Therapy   Pelvic and  vaginal brachytherapy completed    Relapse/Recurrence   PA node recurrence    - 03/15/2012 Radiation Therapy      PAST MEDICAL HISTORY:  Past Medical History:  Diagnosis Date  . Anxiety   . Arthritis   . DVT (deep venous thrombosis) ([place])   . Endometrial cancer ([place]) dx'd 01/2008   recurrences 08/2011; 01/2012; xrt comp 03/2012  . GERD (gastroesophageal reflux disease)   .  Hyperlipidemia   . Hypertension   . Migraines   . Radiation 10/2011   External Beam/Intracavitary  . S/P radiation therapy 01/29/12 - 03/10/12   Periaortic area/ 5600 cgy/ 28 Fractions     PAST SURGICAL HISTORY:  Past Surgical History:  Procedure Laterality Date  . ANTERIOR LATERAL LUMBAR FUSION 4 LEVELS Left 05/03/2016   Procedure: Lumbar three Corpectomy  with Lumbar one to lumbar five fixation fusion with percutaneous pedicle screws: left hemilaminectomy open reduction fracture fragment;  Surgeon: Kevan Ny Ditty, MD;  Location: [place] [facility];  Service: Neurosurgery;  Laterality: Left;  . CHOLECYSTECTOMY    . EYE SURGERY    . LUMBAR PERCUTANEOUS PEDICLE SCREW 4 LEVEL Bilateral 05/03/2016   Procedure: LUMBAR PERCUTANEOUS PEDICLE SCREW 4 LEVEL;  Surgeon: Kevan Ny Ditty, MD;  Location: [place] [facility];  Service: Neurosurgery;  Laterality: Bilateral;  . TOTAL ABDOMINAL HYSTERECTOMY W/ BILATERAL SALPINGOOPHORECTOMY  2009   No LMP recorded. Patient has had a hysterectomy.  Age at menarche: 2  Age at menopause: 61 Hx of HRT: denies Hx of STDs: no Last pap: 10/2019, normal History of abnormal pap smears:  no  SCREENING STUDIES:  Last mammogram: 2020  Last colonoscopy: 05/2019 Last bone mineral density: 2018  MEDICATIONS: Outpatient Encounter Medications as of 02/18/2020  Medication Sig  . acetaminophen (TYLENOL) 500 MG tablet Take 500-1,000 mg by mouth every 8 (eight) hours as needed for mild pain or moderate pain.  . Calcium Carbonate (CALTRATE 600 PO) Take 1 tablet by mouth 3 (three) times daily.   . calcium carbonate (TUMS - DOSED IN MG ELEMENTAL CALCIUM) 500 MG chewable tablet Chew 1 tablet by mouth every 8 (eight) hours as needed for heartburn.   . cholecalciferol (VITAMIN D) 1000 UNITS tablet Take 500 Units by mouth daily.   . CRESTOR 40 MG tablet 20 mg.   . ELIQUIS 2.5 MG TABS tablet TAKE 1 TABLET BY MOUTH  TWICE DAILY  . furosemide (LASIX) 20 MG tablet TAKE 1 TABLET BY  MOUTH ONCE DAILY  . Multiple Vitamin (DAILY VALUE MULTIVITAMIN PO) Take 1 tablet by mouth daily.  . propranolol ER (INDERAL LA) 80 MG 24 hr capsule Take 80 mg by mouth daily.   . psyllium (METAMUCIL) 58.6 % powder Take 1 packet by mouth daily as needed.   . risedronate (ACTONEL) 35 MG tablet   . vitamin C (ASCORBIC ACID) 500 MG tablet Take 500 mg by mouth daily.    Derrill Memo ON 02/19/2020] estradiol (ESTRACE VAGINAL) 0.1 MG/GM vaginal cream Place 1 Applicatorful vaginally 3 (three) times a week.  . [DISCONTINUED] vitamin E (VITAMIN E) 400 UNIT capsule Take 400 Units by mouth daily. (Patient not taking: Reported on 02/18/2020)  . [DISCONTINUED] zinc gluconate 50 MG tablet Take 50 mg by mouth daily.   (Patient not taking: Reported on 02/18/2020)   No facility-administered encounter medications on file as of 02/18/2020.    ALLERGIES:  Not on File   FAMILY HISTORY:  Family History  Problem Relation Age of Onset  . Heart failure Mother   . Ulcers Father      SOCIAL HISTORY:    Social Connections:   . Frequency of Communication with Friends and Family:   . Frequency of Social Gatherings with Friends and Family:   . Attends Religious Services:   . Active Member of Clubs or Organizations:   . Attends Archivist Meetings:   Marland Kitchen Marital Status:     REVIEW OF SYSTEMS:  Pertinent positives as per HPI Denies appetite changes, fevers, chills, fatigue, unexplained weight changes. Denies hearing loss, neck lumps or masses, mouth sores, ringing in ears or voice changes. Denies cough or wheezing.  Denies shortness of breath. Denies chest pain or palpitations. Denies leg swelling. Denies abdominal distention, pain, blood in stools, constipation, diarrhea, nausea, vomiting, or early satiety. Denies pain with intercourse, dysuria, frequency, hematuria or incontinence. Denies hot flashes, vaginal bleeding or vaginal discharge.   Denies joint pain, or muscle pain/cramps. Denies itching, rash,  or wounds. Denies dizziness, headaches, numbness or seizures. Denies swollen lymph nodes or glands, denies easy bruising or bleeding. Denies anxiety, depression, confusion, or decreased concentration.  Physical Exam:  Vital Signs for this encounter:  Blood pressure (!) 121/54, pulse (!) 50, temperature 98.5 F (36.9 C), temperature source Oral, resp. rate 16, height 5\' 3"  (1.6 m), weight 176 lb 6 oz (80 kg), SpO2 97 %. Body mass index is 31.24 kg/m. General: Alert, oriented, no acute distress.  HEENT: Normocephalic, atraumatic. Sclera anicteric.  Chest: Clear to auscultation bilaterally. No wheezes, rhonchi, or rales. Cardiovascular: Regular rate and rhythm, no murmurs, rubs, or gallops.  Abdomen:  Obese. Normoactive bowel sounds. Soft, nondistended, nontender to palpation. No masses or hepatosplenomegaly appreciated. No palpable fluid wave. Well healed incision. Radiation tattoos noted. Back: no pain with palpation of lower back. Extremities: Grossly normal range of motion. Warm, well perfused. No edema bilaterally.  Skin: No rashes or lesions.  Lymphatics: No cervical, supraclavicular, or inguinal adenopathy.  GU:  Normal external female genitalia although mildly erythematous diffusely over vulva. No loss of architecture noted.  No lesions. No discharge or bleeding.             Bladder/urethra:  No lesions or masses, bladder and vaginal apex prolapse approximately 1cm past the introitus.             Vagina: atrophic, no lesions, shortened, changes c/w prior radiation.             Cervix/uterus/adenxa: surgically absent.  LABORATORY AND RADIOLOGIC DATA:  Outside medical records were reviewed to synthesize the above history, along with the history and physical obtained during the visit.   CT A/P on 01/15/20: IMPRESSION: 1. New left lumbar abdominal wall hernia containing a loop of descending colon. No evidence of bowel obstruction or strangulation. 2. No evidence of recurrent or  metastatic carcinoma. 3. Colonic diverticulosis, without radiographic evidence of diverticulitis.

## 2020-02-18 ENCOUNTER — Encounter: Payer: Self-pay | Admitting: Gynecologic Oncology

## 2020-02-18 ENCOUNTER — Other Ambulatory Visit: Payer: Self-pay

## 2020-02-18 ENCOUNTER — Inpatient Hospital Stay: Payer: Medicare Other | Attending: Gynecologic Oncology | Admitting: Gynecologic Oncology

## 2020-02-18 VITALS — BP 121/54 | HR 50 | Temp 98.5°F | Resp 16 | Ht 63.0 in | Wt 176.4 lb

## 2020-02-18 DIAGNOSIS — C541 Malignant neoplasm of endometrium: Secondary | ICD-10-CM

## 2020-02-18 DIAGNOSIS — N8110 Cystocele, unspecified: Secondary | ICD-10-CM | POA: Diagnosis not present

## 2020-02-18 DIAGNOSIS — Z923 Personal history of irradiation: Secondary | ICD-10-CM | POA: Insufficient documentation

## 2020-02-18 DIAGNOSIS — Z90722 Acquired absence of ovaries, bilateral: Secondary | ICD-10-CM | POA: Diagnosis not present

## 2020-02-18 DIAGNOSIS — N952 Postmenopausal atrophic vaginitis: Secondary | ICD-10-CM | POA: Diagnosis not present

## 2020-02-18 DIAGNOSIS — Z8542 Personal history of malignant neoplasm of other parts of uterus: Secondary | ICD-10-CM | POA: Insufficient documentation

## 2020-02-18 DIAGNOSIS — R1030 Lower abdominal pain, unspecified: Secondary | ICD-10-CM | POA: Insufficient documentation

## 2020-02-18 DIAGNOSIS — Z08 Encounter for follow-up examination after completed treatment for malignant neoplasm: Secondary | ICD-10-CM | POA: Insufficient documentation

## 2020-02-18 DIAGNOSIS — Z01411 Encounter for gynecological examination (general) (routine) with abnormal findings: Secondary | ICD-10-CM | POA: Insufficient documentation

## 2020-02-18 DIAGNOSIS — R152 Fecal urgency: Secondary | ICD-10-CM | POA: Insufficient documentation

## 2020-02-18 DIAGNOSIS — Z90710 Acquired absence of both cervix and uterus: Secondary | ICD-10-CM | POA: Insufficient documentation

## 2020-02-18 MED ORDER — ESTRADIOL 0.1 MG/GM VA CREA
1.0000 | TOPICAL_CREAM | VAGINAL | 6 refills | Status: DC
Start: 2020-02-19 — End: 2023-12-16

## 2020-02-18 NOTE — Patient Instructions (Addendum)
Overall your exam is reassuring today.  You do have evidence of prolapse both of your bladder and the top of the vagina.  You may be a candidate for a pessary but your vaginal length is shortened and you have some changes related to radiation.  Would like to start vaginal estrogen to see if that helps with any of your urinary symptoms and your external burning symptoms.  In terms of your bowels, I would recommend taking Metamucil regularly and eating high-fiber diet.  I think your accidents may decrease in frequency if you are able to bulk your stools up a little bit more.    High-Fiber Diet  Fiber, also called dietary fiber, is a type of carbohydrate that is found in fruits, vegetables, whole grains, and beans. A high-fiber diet can have many health benefits. Your health care provider may recommend a high-fiber diet to help:  Prevent constipation. Fiber can make your bowel movements more regular.  Lower your cholesterol.  Relieve the following conditions: ? Swelling of veins in the anus (hemorrhoids). ? Swelling and irritation (inflammation) of specific areas of the digestive tract (uncomplicated diverticulosis). ? A problem of the large intestine (colon) that sometimes causes pain and diarrhea (irritable bowel syndrome, IBS).  Prevent overeating as part of a weight-loss plan.  Prevent heart disease, type 2 diabetes, and certain cancers. What is my plan? The recommended daily fiber intake in grams (g) includes:  38 g for men age 51 or younger.  30 g for men over age 68.  38 g for women age 35 or younger.  21 g for women over age 3. You can get the recommended daily intake of dietary fiber by:  Eating a variety of fruits, vegetables, grains, and beans.  Taking a fiber supplement, if it is not possible to get enough fiber through your diet. What do I need to know about a high-fiber diet?  It is better to get fiber through food sources rather than from fiber supplements. There  is not a lot of research about how effective supplements are.  Always check the fiber content on the nutrition facts label of any prepackaged food. Look for foods that contain 5 g of fiber or more per serving.  Talk with a diet and nutrition specialist (dietitian) if you have questions about specific foods that are recommended or not recommended for your medical condition, especially if those foods are not listed below.  Gradually increase how much fiber you consume. If you increase your intake of dietary fiber too quickly, you may have bloating, cramping, or gas.  Drink plenty of water. Water helps you to digest fiber. What are tips for following this plan?  Eat a wide variety of high-fiber foods.  Make sure that half of the grains that you eat each day are whole grains.  Eat breads and cereals that are made with whole-grain flour instead of refined flour or white flour.  Eat brown rice, bulgur wheat, or millet instead of white rice.  Start the day with a breakfast that is high in fiber, such as a cereal that contains 5 g of fiber or more per serving.  Use beans in place of meat in soups, salads, and pasta dishes.  Eat high-fiber snacks, such as berries, raw vegetables, nuts, and popcorn.  Choose whole fruits and vegetables instead of processed forms like juice or sauce. What foods can I eat?  Fruits Berries. Pears. Apples. Oranges. Avocado. Prunes and raisins. Dried figs. Vegetables Sweet potatoes. Spinach. Kale.  Artichokes. Cabbage. Broccoli. Cauliflower. Green peas. Carrots. Squash. Grains Whole-grain breads. Multigrain cereal. Oats and oatmeal. Brown rice. Barley. Bulgur wheat. [place]. Quinoa. Bran muffins. Popcorn. Rye wafer crackers. Meats and other proteins Navy, kidney, and pinto beans. Soybeans. Split peas. Lentils. Nuts and seeds. Dairy Fiber-fortified yogurt. Beverages Fiber-fortified soy milk. Fiber-fortified orange juice. Other foods Fiber bars. The items  listed above may not be a complete list of recommended foods and beverages. Contact a dietitian for more options. What foods are not recommended? Fruits Fruit juice. Cooked, strained fruit. Vegetables Fried potatoes. Canned vegetables. Well-cooked vegetables. Grains White bread. Pasta made with refined flour. White rice. Meats and other proteins Fatty cuts of meat. Fried chicken or fried fish. Dairy Milk. Yogurt. Cream cheese. Sour cream. Fats and oils Butters. Beverages Soft drinks. Other foods Cakes and pastries. The items listed above may not be a complete list of foods and beverages to avoid. Contact a dietitian for more information. Summary  Fiber is a type of carbohydrate. It is found in fruits, vegetables, whole grains, and beans.  There are many health benefits of eating a high-fiber diet, such as preventing constipation, lowering blood cholesterol, helping with weight loss, and reducing your risk of heart disease, diabetes, and certain cancers.  Gradually increase your intake of fiber. Increasing too fast can result in cramping, bloating, and gas. Drink plenty of water while you increase your fiber.  The best sources of fiber include whole fruits and vegetables, whole grains, nuts, seeds, and beans. This information is not intended to replace advice given to you by your health care provider. Make sure you discuss any questions you have with your health care provider. Document Revised: 06/24/2017 Document Reviewed: 06/24/2017 Elsevier Patient Education  [place].   [facility] Health Content in Foods  See the following list for the dietary fiber content of some common foods. High-fiber foods High-fiber foods contain 4 grams or more (4g or more) of fiber per serving. They include:  Artichoke (fresh) -- 1 medium has 10.3g of fiber.  Baked beans, plain or vegetarian (canned) --  cup has 5.2g of fiber.  Blackberries or raspberries (fresh) --  cup has 4g of fiber.  Bran  cereal --  cup has 8.6g of fiber.  Bulgur (cooked) --  cup has 4g of fiber.  Kidney beans (canned) --  cup has 6.8g of fiber.  Lentils (cooked) --  cup has 7.8g of fiber.  Pear (fresh) -- 1 medium has 5.1g of fiber.  Peas (frozen) --  cup has 4.4g of fiber.  Pinto beans (canned) --  cup has 5.5g of fiber.  Pinto beans (dried and cooked) --  cup has 7.7g of fiber.  Potato with skin (baked) -- 1 medium has 4.4g of fiber.  Quinoa (cooked) --  cup has 5g of fiber.  Soybeans (canned, frozen, or fresh) --  cup has 5.1g of fiber. Moderate-fiber foods Moderate-fiber foods contain 1-4 grams (1-4g) of fiber per serving. They include:  Almonds -- 1 oz. has 3.5g of fiber.  Apple with skin -- 1 medium has 3.3g of fiber.  Applesauce, sweetened --  cup has 1.5g of fiber.  Bagel, plain -- one 4-inch (10-cm) bagel has 2g of fiber.  Banana -- 1 medium has 3.1g of fiber.  Broccoli (cooked) --  cup has 2.5g of fiber.  Carrots (cooked) --  cup has 2.3g of fiber.  Corn (canned or frozen) --  cup has 2.1g of fiber.  Corn tortilla -- one 6-inch (15-cm) tortilla has  1.5g of fiber.  Green beans (canned) --  cup has 2g of fiber.  Instant oatmeal --  cup has about 2g of fiber.  Long-grain brown rice (cooked) -- 1 cup has 3.5g of fiber.  Macaroni, enriched (cooked) -- 1 cup has 2.5g of fiber.  Melon -- 1 cup has 1.4g of fiber.  Multigrain cereal --  cup has about 2-4g of fiber.  Orange -- 1 small has 3.1g of fiber.  Potatoes, mashed --  cup has 1.6g of fiber.  Raisins -- 1/4 cup has 1.6g of fiber.  Squash --  cup has 2.9g of fiber.  Sunflower seeds --  cup has 1.1g of fiber.  Tomato -- 1 medium has 1.5g of fiber.  Vegetable or soy patty -- 1 has 3.4g of fiber.  Whole-wheat bread -- 1 slice has 2g of fiber.  Whole-wheat spaghetti --  cup has 3.2g of fiber. Low-fiber foods Low-fiber foods contain less than 1 gram (less than 1g) of fiber per serving. They  include:  Egg -- 1 large.  Flour tortilla -- one 6-inch (15-cm) tortilla.  Fruit juice --  cup.  Lettuce -- 1 cup.  Meat, poultry, or fish -- 1 oz.  Milk -- 1 cup.  Spinach (raw) -- 1 cup.  White bread -- 1 slice.  White rice --  cup.  Yogurt --  cup. Actual amounts of fiber in foods may be different depending on processing. Talk with your dietitian about how much fiber you need in your diet. This information is not intended to replace advice given to you by your health care provider. Make sure you discuss any questions you have with your health care provider. Document Revised: 04/12/2016 Document Reviewed: 10/13/2015 Elsevier Patient Education  [place].

## 2020-02-19 ENCOUNTER — Telehealth: Payer: Self-pay | Admitting: *Deleted

## 2020-02-19 NOTE — Telephone Encounter (Signed)
Called Dr Maryland Pink office for a referral, scheduled an appt for 9/15 at 1:30pm. They will send her a new patient package in the mail. Called and left the patient a message to call the office

## 2020-02-19 NOTE — Telephone Encounter (Signed)
Patient called back and was given the information for the referral appt date/time/address and phone number

## 2020-09-05 ENCOUNTER — Telehealth: Payer: Self-pay | Admitting: Cardiology

## 2020-09-23 ENCOUNTER — Ambulatory Visit: Payer: Medicare Other | Admitting: Cardiology

## 2020-10-24 ENCOUNTER — Encounter: Payer: Self-pay | Admitting: Cardiology

## 2020-10-24 ENCOUNTER — Other Ambulatory Visit: Payer: Self-pay

## 2020-10-24 ENCOUNTER — Ambulatory Visit: Payer: Medicare Other | Admitting: Cardiology

## 2020-10-24 VITALS — BP 137/66 | HR 62 | Temp 97.7°F | Resp 16 | Ht 63.0 in | Wt 179.0 lb

## 2020-10-24 DIAGNOSIS — I10 Essential (primary) hypertension: Secondary | ICD-10-CM

## 2020-10-24 DIAGNOSIS — E782 Mixed hyperlipidemia: Secondary | ICD-10-CM

## 2020-10-24 NOTE — Progress Notes (Signed)
Follow up visit  Subjective:   [name], female    DOB: [birth year], 80 y.o.   MRN: [id number]   HPI  Chief Complaint  Patient presents with  . Hypertension  . Follow-up    1 year  . pulmonary embolism    80 year old Caucasian female with history of endometrial cancer, currently in remission, recurrent history of DVT in 1997 and 2017, history of PE in 2017.  Patient is doing well, denies chest pain, shortness of breath, palpitations, leg edema, orthopnea, PND, TIA/syncope. Sh takes lasix 2-3 times/week.    Current Outpatient Medications on File Prior to Visit  Medication Sig Dispense Refill  . acetaminophen (TYLENOL) 500 MG tablet Take 500-1,000 mg by mouth every 8 (eight) hours as needed for mild pain or moderate pain.    . Calcium Carbonate (CALTRATE 600 PO) Take 1 tablet by mouth 3 (three) times daily.     . calcium carbonate (TUMS - DOSED IN MG ELEMENTAL CALCIUM) 500 MG chewable tablet Chew 1 tablet by mouth every 8 (eight) hours as needed for heartburn.     . cholecalciferol (VITAMIN D) 1000 UNITS tablet Take 500 Units by mouth daily.     . CRESTOR 40 MG tablet 20 mg.     . ELIQUIS 2.5 MG TABS tablet TAKE 1 TABLET BY MOUTH  TWICE DAILY 180 tablet 3  . estradiol (ESTRACE VAGINAL) 0.1 MG/GM vaginal cream Place 1 Applicatorful vaginally 3 (three) times a week. 42.5 g 6  . furosemide (LASIX) 20 MG tablet TAKE 1 TABLET BY MOUTH ONCE DAILY 90 tablet 3  . Multiple Vitamin (DAILY VALUE MULTIVITAMIN PO) Take 1 tablet by mouth daily.    . propranolol ER (INDERAL LA) 80 MG 24 hr capsule Take 80 mg by mouth daily.     . psyllium (METAMUCIL) 58.6 % powder Take 1 packet by mouth daily as needed.     . risedronate (ACTONEL) 35 MG tablet     . vitamin C (ASCORBIC ACID) 500 MG tablet Take 500 mg by mouth daily.       No current facility-administered medications on file prior to visit.    Cardiovascular & other pertient studies:  EKG 10/24/2020: Sinus rhythm 57 bpm Low voltage  in precordial leads  Left axis deviation Poor R-wave progression  Echocardiogram 08/20/2017: Left ventricle cavity is normal in size. Mild concentric hypertrophy of the left ventricle. Normal global wall motion. Doppler evidence of grade I (impaired) diastolic dysfunction, normal LAP. Calculated EF 64%. No significant valvular abnormality. No evidence of pulmonary hypertension.  Recent labs: 10/30/2019: Glucose 99, BUN/Cr 12/0.76. EGFR 75. Na/K 142/3.7. Rest of the CMP normal H/H 12/38. MCV 95. Platelets 211 HbA1C 5.7% Chol 127, TG 111, HDL 54, LDL 64 TSH 4.7 normal    Review of Systems  Cardiovascular: Negative for chest pain, dyspnea on exertion, leg swelling, palpitations and syncope.        Vitals:   10/24/20 1131  BP: 137/66  Pulse: 62  Resp: 16  Temp: 97.7 F (36.5 C)  SpO2: 98%     Body mass index is 31.71 kg/m. Filed Weights   10/24/20 1131  Weight: 179 lb (81.2 kg)     Objective:   Physical Exam Vitals and nursing note reviewed.  Constitutional:      Appearance: She is well-developed.  Neck:     Vascular: No JVD.  Cardiovascular:     Rate and Rhythm: Normal rate and regular rhythm.     Pulses: Intact  distal pulses.     Heart sounds: Normal heart sounds. No murmur heard.   Pulmonary:     Effort: Pulmonary effort is normal.     Breath sounds: Normal breath sounds. No wheezing or rales.           Assessment & Recommendations:   80 year old Caucasian female with history of endometrial cancer, currently in remission, recurrent history of DVT in 1997 and 2017, history of PE in 2017  HFpEF: Resolved. Take lasix only as needed for leg edema.  H/o recurrent DVT/PE: Low-dose Eliquis 2.5 mg twice daily. Tolerating well  Hypertension: She has been on propranolol for a long time, initially started for migraine but continued for hypertension. Okay to continue.  Hyperlipidemia: Well controlled, as of 10/2019. Has upcoming labs with PCP next  month Continue Crestor  I will see her on as needed basis   Nigel Mormon, MD [facility] Pager: [phone] Office: [phone]

## 2020-12-16 ENCOUNTER — Other Ambulatory Visit: Payer: Self-pay | Admitting: Cardiology

## 2021-03-16 ENCOUNTER — Other Ambulatory Visit: Payer: Self-pay | Admitting: Cardiology

## 2022-02-19 ENCOUNTER — Other Ambulatory Visit: Payer: Self-pay | Admitting: Cardiology

## 2022-03-15 ENCOUNTER — Other Ambulatory Visit (HOSPITAL_COMMUNITY): Payer: Self-pay | Admitting: *Deleted

## 2022-03-16 ENCOUNTER — Ambulatory Visit (HOSPITAL_COMMUNITY)
Admission: RE | Admit: 2022-03-16 | Discharge: 2022-03-16 | Disposition: A | Discharge status: Home / Self Care | Payer: Medicare Other | Source: Ambulatory Visit | Attending: Internal Medicine | Admitting: Internal Medicine

## 2022-03-16 DIAGNOSIS — M810 Age-related osteoporosis without current pathological fracture: Secondary | ICD-10-CM | POA: Diagnosis present

## 2022-03-16 MED ORDER — DENOSUMAB 60 MG/ML ~~LOC~~ SOSY
PREFILLED_SYRINGE | SUBCUTANEOUS | Status: AC
  Administered 2022-03-16: 60 mg via SUBCUTANEOUS
  Filled 2022-03-16: qty 1

## 2022-03-16 MED ORDER — DENOSUMAB 60 MG/ML ~~LOC~~ SOSY: 60.0000 mg | PREFILLED_SYRINGE | Freq: Once | SUBCUTANEOUS | Status: AC

## 2022-03-27 ENCOUNTER — Other Ambulatory Visit: Payer: Self-pay

## 2022-03-27 MED ORDER — APIXABAN 2.5 MG PO TABS: 2.5000 mg | ORAL_TABLET | Freq: Two times a day (BID) | ORAL | 3 refills | Status: DC

## 2022-06-13 ENCOUNTER — Other Ambulatory Visit: Payer: Self-pay | Admitting: Cardiology

## 2022-07-20 ENCOUNTER — Encounter: Payer: Self-pay | Admitting: Cardiology

## 2022-07-20 ENCOUNTER — Ambulatory Visit: Payer: Medicare Other | Admitting: Cardiology

## 2022-07-20 VITALS — BP 120/64 | HR 61 | Resp 16 | Ht 63.0 in | Wt 173.0 lb

## 2022-07-20 DIAGNOSIS — I5033 Acute on chronic diastolic (congestive) heart failure: Secondary | ICD-10-CM

## 2022-07-20 DIAGNOSIS — I10 Essential (primary) hypertension: Secondary | ICD-10-CM

## 2022-07-20 MED ORDER — APIXABAN 2.5 MG PO TABS: 2.5000 mg | ORAL_TABLET | Freq: Two times a day (BID) | ORAL | 3 refills | Status: DC

## 2022-07-20 MED ORDER — FUROSEMIDE 20 MG PO TABS: 20.0000 mg | ORAL_TABLET | Freq: Every day | ORAL | 3 refills | Status: DC

## 2022-07-20 NOTE — Progress Notes (Signed)
Follow up visit  Subjective:   [name], female    DOB: 28-Jul-1941, 81 y.o.   MRN: [id number]   HPI  Chief Complaint  Patient presents with   Medication Management    81 year old Caucasian female with history of endometrial cancer, currently in remission, recurrent history of DVT in 1997 and 2017, history of PE in 2017.  Patient has had recent exertional dyspnea symptoms. She also has occasional leg edema.    Current Outpatient Medications:    acetaminophen (TYLENOL) 500 MG tablet, Take 500-1,000 mg by mouth every 8 (eight) hours as needed for mild pain or moderate pain., Disp: , Rfl:    apixaban (ELIQUIS) 2.5 MG TABS tablet, Take 1 tablet (2.5 mg total) by mouth 2 (two) times daily., Disp: 180 tablet, Rfl: 3   Calcium Carbonate (CALTRATE 600 PO), Take 1 tablet by mouth 3 (three) times daily., Disp: , Rfl:    calcium carbonate (TUMS - DOSED IN MG ELEMENTAL CALCIUM) 500 MG chewable tablet, Chew 1 tablet by mouth every 8 (eight) hours as needed for heartburn., Disp: , Rfl:    cholecalciferol (VITAMIN D) 1000 UNITS tablet, Take 500 Units by mouth daily., Disp: , Rfl:    CRESTOR 40 MG tablet, 20 mg. , Disp: , Rfl:    cyanocobalamin (VITAMIN B12) 1000 MCG tablet, Take 1,000 mcg by mouth daily., Disp: , Rfl:    estradiol (ESTRACE VAGINAL) 0.1 MG/GM vaginal cream, Place 1 Applicatorful vaginally 3 (three) times a week., Disp: 42.5 g, Rfl: 6   ferrous sulfate 325 (65 FE) MG EC tablet, Take 325 mg by mouth 3 (three) times daily with meals., Disp: , Rfl:    furosemide (LASIX) 20 MG tablet, TAKE 1 TABLET BY MOUTH ONCE DAILY, Disp: 90 tablet, Rfl: 3   Multiple Vitamin (DAILY VALUE MULTIVITAMIN PO), Take 1 tablet by mouth daily., Disp: , Rfl:    propranolol ER (INDERAL LA) 80 MG 24 hr capsule, Take 80 mg by mouth daily. , Disp: , Rfl:    psyllium (METAMUCIL) 58.6 % powder, Take 1 packet by mouth daily as needed. , Disp: , Rfl:    vitamin C (ASCORBIC ACID) 500 MG tablet, Take 500 mg by  mouth daily., Disp: , Rfl:    risedronate (ACTONEL) 35 MG tablet, , Disp: , Rfl:    Cardiovascular & other pertient studies:  EKG 07/20/2022: Sinus rhythm 54 bpm Leftward axis Low voltage in precordial leads  Echocardiogram 08/20/2017: Left ventricle cavity is normal in size. Mild concentric hypertrophy of the left ventricle. Normal global wall motion. Doppler evidence of grade I (impaired) diastolic dysfunction, normal LAP. Calculated EF 64%. No significant valvular abnormality. No evidence of pulmonary hypertension.  Recent labs: April-August 2023: Glucose 93, BUN/Cr 12/0.7. EGFR 69. K 4.2 HbA1C 5.6% Chol 119, TG 84, HDL 54, LDL 48 TSH 1.5 normal  10/30/2019: Glucose 99, BUN/Cr 12/0.76. EGFR 75. Na/K 142/3.7. Rest of the CMP normal H/H 12/38. MCV 95. Platelets 211 HbA1C 5.7% Chol 127, TG 111, HDL 54, LDL 64 TSH 4.7 normal    Review of Systems  Cardiovascular:  Negative for chest pain, dyspnea on exertion, leg swelling, palpitations and syncope.        Vitals:   07/20/22 1302  BP: 120/64  Pulse: 61  Resp: 16  SpO2: 97%     Body mass index is 30.65 kg/m. Filed Weights   07/20/22 1302  Weight: 173 lb (78.5 kg)     Objective:   Physical Exam Vitals and  nursing note reviewed.  Constitutional:      Appearance: She is well-developed.  Neck:     Vascular: No JVD.  Cardiovascular:     Rate and Rhythm: Normal rate and regular rhythm.     Pulses: Intact distal pulses.     Heart sounds: Normal heart sounds. No murmur heard. Pulmonary:     Effort: Pulmonary effort is normal.     Breath sounds: Normal breath sounds. No wheezing or rales.           Assessment & Recommendations:   81 year old Caucasian female with history of endometrial cancer, currently in remission, recurrent history of DVT in 1997 and 2017, history of PE in 2017  HFpEF: Acute on chronic. Recent worsening symptoms. Increase lasix to 20 mg bid. Check echocardiogram, labs.  H/o  recurrent DVT/PE: Low-dose Eliquis 2.5 mg twice daily. Tolerating well  Hypertension: She has been on propranolol for a long time, initially started for migraine but continued for hypertension. Okay to continue.  Hyperlipidemia: Well controlled, as of 10/2019. Has upcoming labs with PCP next month Continue Crestor  F/u in 4 weeks   Nigel Mormon, MD Pager: [phone] Office: [phone]

## 2022-07-24 LAB — BASIC METABOLIC PANEL
BUN/Creatinine Ratio: 15 (ref 12–28)
BUN: 13 mg/dL (ref 8–27)
CO2: 29 mmol/L (ref 20–29)
Calcium: 9.8 mg/dL (ref 8.7–10.3)
Chloride: 101 mmol/L (ref 96–106)
Creatinine, Ser: 0.84 mg/dL (ref 0.57–1.00)
Glucose: 97 mg/dL (ref 70–99)
Potassium: 4.3 mmol/L (ref 3.5–5.2)
Sodium: 142 mmol/L (ref 134–144)
eGFR: 70 mL/min/{1.73_m2} (ref >59)

## 2022-07-24 LAB — BRAIN NATRIURETIC PEPTIDE: BNP: 26.5 pg/mL (ref 0.0–100.0)

## 2022-07-31 ENCOUNTER — Ambulatory Visit: Payer: Medicare Other

## 2022-07-31 DIAGNOSIS — I5033 Acute on chronic diastolic (congestive) heart failure: Secondary | ICD-10-CM

## 2022-07-31 NOTE — Progress Notes (Signed)
Today's echocardiogram and recent bloodwork do not suggest obvious congestive heart failure. If symptoms are improved with lasix, continue the same. If not, will need to look for alternate causes of shortness of breath. Will follow up during next office visit.  Thanks MJP

## 2022-08-01 ENCOUNTER — Telehealth: Payer: Self-pay

## 2022-08-01 NOTE — Telephone Encounter (Signed)
-----   Message from [facility], MD sent at 07/31/2022  3:07 PM EST ----- Today's echocardiogram and recent bloodwork do not suggest obvious congestive heart failure. If symptoms are improved with lasix, continue the same. If not, will need to look for alternate causes of shortness of breath. Will follow up during next office visit.  Thanks MJP

## 2022-08-01 NOTE — Telephone Encounter (Signed)
Made patient aware of results. She verbalized understanding.

## 2022-08-17 ENCOUNTER — Encounter: Payer: Self-pay | Admitting: Cardiology

## 2022-08-17 ENCOUNTER — Ambulatory Visit: Payer: Medicare Other | Admitting: Cardiology

## 2022-08-17 VITALS — BP 136/71 | HR 83 | Resp 16 | Ht 63.0 in | Wt 176.0 lb

## 2022-08-17 DIAGNOSIS — E782 Mixed hyperlipidemia: Secondary | ICD-10-CM

## 2022-08-17 DIAGNOSIS — I5033 Acute on chronic diastolic (congestive) heart failure: Secondary | ICD-10-CM

## 2022-08-17 DIAGNOSIS — I10 Essential (primary) hypertension: Secondary | ICD-10-CM

## 2022-08-17 MED ORDER — FUROSEMIDE 40 MG PO TABS: 40.0000 mg | ORAL_TABLET | Freq: Every day | ORAL | 3 refills | Status: DC

## 2022-08-17 NOTE — Progress Notes (Signed)
Follow up visit  Subjective:   [name], female    DOB: Nov 16, 1940, 81 y.o.   MRN: [id number]   HPI  Chief Complaint  Patient presents with   Acute on chronic heart failure with preserved ejection frac   Follow-up    19 week    81 year old Caucasian female with history of endometrial cancer, currently in remission, recurrent history of DVT in 1997 and 2017, history of PE in 2017.  Reviewed recent test results with the patient, details below.  She still feels fatigued, her dyspnea has definitely improved with Lasix 20 mg twice daily.   Current Outpatient Medications:    acetaminophen (TYLENOL) 500 MG tablet, Take 500-1,000 mg by mouth every 8 (eight) hours as needed for mild pain or moderate pain., Disp: , Rfl:    apixaban (ELIQUIS) 2.5 MG TABS tablet, Take 1 tablet (2.5 mg total) by mouth 2 (two) times daily., Disp: 180 tablet, Rfl: 3   Calcium Carbonate (CALTRATE 600 PO), Take 1 tablet by mouth 3 (three) times daily., Disp: , Rfl:    calcium carbonate (TUMS - DOSED IN MG ELEMENTAL CALCIUM) 500 MG chewable tablet, Chew 1 tablet by mouth every 8 (eight) hours as needed for heartburn., Disp: , Rfl:    cholecalciferol (VITAMIN D) 1000 UNITS tablet, Take 500 Units by mouth daily., Disp: , Rfl:    CRESTOR 40 MG tablet, 20 mg. , Disp: , Rfl:    cyanocobalamin (VITAMIN B12) 1000 MCG tablet, Take 1,000 mcg by mouth daily., Disp: , Rfl:    estradiol (ESTRACE VAGINAL) 0.1 MG/GM vaginal cream, Place 1 Applicatorful vaginally 3 (three) times a week., Disp: 42.5 g, Rfl: 6   ferrous sulfate 325 (65 FE) MG EC tablet, Take 325 mg by mouth 3 (three) times daily with meals., Disp: , Rfl:    furosemide (LASIX) 20 MG tablet, Take 1 tablet (20 mg total) by mouth daily., Disp: 180 tablet, Rfl: 3   Multiple Vitamin (DAILY VALUE MULTIVITAMIN PO), Take 1 tablet by mouth daily., Disp: , Rfl:    propranolol ER (INDERAL LA) 80 MG 24 hr capsule, Take 80 mg by mouth daily. , Disp: , Rfl:    psyllium  (METAMUCIL) 58.6 % powder, Take 1 packet by mouth daily as needed. , Disp: , Rfl:    risedronate (ACTONEL) 35 MG tablet, , Disp: , Rfl:    vitamin C (ASCORBIC ACID) 500 MG tablet, Take 500 mg by mouth daily., Disp: , Rfl:    Cardiovascular & other pertient studies:  Echocardiogram 07/31/2022: Left ventricle cavity is normal in size and wall thickness. Normal global wall motion. Normal LV systolic function with EF 67%. Doppler evidence of grade I (impaired) diastolic dysfunction, normal LAP. Mild to moderate mitral regurgitation. Mild tricuspid regurgitation. No evidence of pulmonary hypertension. Compared to previous study in 2018, MR, TR are new findings.   EKG 07/20/2022: Sinus rhythm 54 bpm Leftward axis Low voltage in precordial leads  Echocardiogram 08/20/2017: Left ventricle cavity is normal in size. Mild concentric hypertrophy of the left ventricle. Normal global wall motion. Doppler evidence of grade I (impaired) diastolic dysfunction, normal LAP. Calculated EF 64%. No significant valvular abnormality. No evidence of pulmonary hypertension.  Recent labs: 07/23/2022: Glucose 97, BUN/Cr 13/0.84. EGFR 70. Na/K 142/4.3.  BNP 26  April-August 2023: Glucose 93, BUN/Cr 12/0.7. EGFR 69. K 4.2 HbA1C 5.6% Chol 119, TG 84, HDL 54, LDL 48 TSH 1.5 normal  10/30/2019: Glucose 99, BUN/Cr 12/0.76. EGFR 75. Na/K 142/3.7. Rest of  the CMP normal H/H 12/38. MCV 95. Platelets 211 HbA1C 5.7% Chol 127, TG 111, HDL 54, LDL 64 TSH 4.7 normal    Review of Systems  Cardiovascular:  Positive for dyspnea on exertion (Improved). Negative for chest pain, leg swelling, palpitations and syncope.        Vitals:   08/17/22 1327  BP: 136/71  Pulse: 83  Resp: 16  SpO2: 93%     Body mass index is 31.18 kg/m. Filed Weights   08/17/22 1327  Weight: 176 lb (79.8 kg)     Objective:   Physical Exam Vitals and nursing note reviewed.  Constitutional:      Appearance: She is  well-developed.  Neck:     Vascular: No JVD.  Cardiovascular:     Rate and Rhythm: Normal rate and regular rhythm.     Pulses: Intact distal pulses.     Heart sounds: Normal heart sounds. No murmur heard. Pulmonary:     Effort: Pulmonary effort is normal.     Breath sounds: Normal breath sounds. No wheezing or rales.  Musculoskeletal:     Right lower leg: No edema.     Left lower leg: No edema.           Assessment & Recommendations:   81 year old Caucasian female with history of endometrial cancer, currently in remission, recurrent history of DVT in 1997 and 2017, history of PE in 2017  HFpEF: Acute on chronic, improving. While her echocardiogram was relatively unremarkable, symptoms have improved with Lasix.  Therefore, continue the same.  Recommend Lasix 40 mg daily.  H/o recurrent DVT/PE: Low-dose Eliquis 2.5 mg twice daily. Tolerating well  Hypertension: She has been on propranolol for a long time, initially started for migraine but continued for hypertension. Okay to continue.  Hyperlipidemia: Continue Crestor  F/u in 3 months    Nigel Mormon, MD Pager: [phone] Office: [phone]

## 2022-09-06 ENCOUNTER — Other Ambulatory Visit: Payer: Self-pay | Admitting: Physician Assistant

## 2022-09-06 DIAGNOSIS — K219 Gastro-esophageal reflux disease without esophagitis: Secondary | ICD-10-CM

## 2022-09-06 DIAGNOSIS — R109 Unspecified abdominal pain: Secondary | ICD-10-CM

## 2022-09-06 DIAGNOSIS — R110 Nausea: Secondary | ICD-10-CM

## 2022-09-21 ENCOUNTER — Other Ambulatory Visit (HOSPITAL_COMMUNITY): Payer: Self-pay

## 2022-09-24 ENCOUNTER — Ambulatory Visit (HOSPITAL_COMMUNITY)
Admission: RE | Admit: 2022-09-24 | Discharge: 2022-09-24 | Disposition: A | Discharge status: Home / Self Care | Payer: Medicare Other | Source: Ambulatory Visit | Attending: Internal Medicine | Admitting: Internal Medicine

## 2022-09-24 DIAGNOSIS — M810 Age-related osteoporosis without current pathological fracture: Secondary | ICD-10-CM | POA: Diagnosis present

## 2022-09-24 MED ORDER — DENOSUMAB 60 MG/ML ~~LOC~~ SOSY
PREFILLED_SYRINGE | SUBCUTANEOUS | Status: AC
  Administered 2022-09-24: 60 mg via SUBCUTANEOUS
  Filled 2022-09-24: qty 1

## 2022-09-24 MED ORDER — DENOSUMAB 60 MG/ML ~~LOC~~ SOSY: 60.0000 mg | PREFILLED_SYRINGE | Freq: Once | SUBCUTANEOUS | Status: AC

## 2022-10-04 ENCOUNTER — Ambulatory Visit
Admission: RE | Admit: 2022-10-04 | Discharge: 2022-10-04 | Disposition: A | Discharge status: Home / Self Care | Payer: Medicare Other | Source: Ambulatory Visit | Attending: Physician Assistant | Admitting: Physician Assistant

## 2022-10-04 DIAGNOSIS — R110 Nausea: Secondary | ICD-10-CM

## 2022-10-04 DIAGNOSIS — K219 Gastro-esophageal reflux disease without esophagitis: Secondary | ICD-10-CM

## 2022-10-04 DIAGNOSIS — R109 Unspecified abdominal pain: Secondary | ICD-10-CM

## 2022-10-04 MED ORDER — IOPAMIDOL (ISOVUE-300) INJECTION 61%
100.0000 mL | Freq: Once | INTRAVENOUS | Status: AC | PRN
  Administered 2022-10-04: 100 mL via INTRAVENOUS

## 2022-10-10 ENCOUNTER — Encounter: Payer: Self-pay | Admitting: Cardiology

## 2022-11-16 ENCOUNTER — Ambulatory Visit: Payer: Medicare Other | Admitting: Cardiology

## 2022-11-16 ENCOUNTER — Encounter: Payer: Self-pay | Admitting: Cardiology

## 2022-11-16 VITALS — BP 131/75 | HR 58 | Ht 63.0 in | Wt 171.4 lb

## 2022-11-16 DIAGNOSIS — I10 Essential (primary) hypertension: Secondary | ICD-10-CM

## 2022-11-16 DIAGNOSIS — R0609 Other forms of dyspnea: Secondary | ICD-10-CM

## 2022-11-16 NOTE — Progress Notes (Signed)
Follow up visit  Subjective:   [name], female    DOB: [birth year], 82 y.o.   MRN: [id number]   HPI  Chief Complaint  Patient presents with   Acute on chronic heart failure with preserved ejection frac   Follow-up    82 year old Caucasian female with history of endometrial cancer, currently in remission, recurrent history of DVT in 1997 and 2017, history of PE in 2017.  Exertional dyspnea improved but remains. No chest pain. Leg edema resolved.    Current Outpatient Medications:    acetaminophen (TYLENOL) 500 MG tablet, Take 500-1,000 mg by mouth every 8 (eight) hours as needed for mild pain or moderate pain., Disp: , Rfl:    apixaban (ELIQUIS) 2.5 MG TABS tablet, Take 1 tablet (2.5 mg total) by mouth 2 (two) times daily., Disp: 180 tablet, Rfl: 3   Calcium Carbonate (CALTRATE 600 PO), Take 1 tablet by mouth 3 (three) times daily., Disp: , Rfl:    calcium carbonate (TUMS - DOSED IN MG ELEMENTAL CALCIUM) 500 MG chewable tablet, Chew 1 tablet by mouth every 8 (eight) hours as needed for heartburn., Disp: , Rfl:    cholecalciferol (VITAMIN D) 1000 UNITS tablet, Take 500 Units by mouth daily., Disp: , Rfl:    CRESTOR 40 MG tablet, 20 mg. , Disp: , Rfl:    cyanocobalamin (VITAMIN B12) 1000 MCG tablet, Take 1,000 mcg by mouth daily., Disp: , Rfl:    estradiol (ESTRACE VAGINAL) 0.1 MG/GM vaginal cream, Place 1 Applicatorful vaginally 3 (three) times a week., Disp: 42.5 g, Rfl: 6   ferrous sulfate 325 (65 FE) MG EC tablet, Take 325 mg by mouth 3 (three) times daily with meals., Disp: , Rfl:    furosemide (LASIX) 40 MG tablet, Take 1 tablet (40 mg total) by mouth daily., Disp: 90 tablet, Rfl: 3   Multiple Vitamin (DAILY VALUE MULTIVITAMIN PO), Take 1 tablet by mouth daily., Disp: , Rfl:    potassium chloride SA (KLOR-CON M) 20 MEQ tablet, Take 20 mEq by mouth 2 (two) times daily., Disp: , Rfl:    propranolol ER (INDERAL LA) 80 MG 24 hr capsule, Take 80 mg by mouth daily. , Disp: ,  Rfl:    psyllium (METAMUCIL) 58.6 % powder, Take 1 packet by mouth daily as needed. , Disp: , Rfl:    vitamin C (ASCORBIC ACID) 500 MG tablet, Take 500 mg by mouth daily., Disp: , Rfl:    Cardiovascular & other pertient studies:  Echocardiogram 07/31/2022: Left ventricle cavity is normal in size and wall thickness. Normal global wall motion. Normal LV systolic function with EF 67%. Doppler evidence of grade I (impaired) diastolic dysfunction, normal LAP. Mild to moderate mitral regurgitation. Mild tricuspid regurgitation. No evidence of pulmonary hypertension. Compared to previous study in 2018, MR, TR are new findings.   EKG 07/20/2022: Sinus rhythm 54 bpm Leftward axis Low voltage in precordial leads  Echocardiogram 08/20/2017: Left ventricle cavity is normal in size. Mild concentric hypertrophy of the left ventricle. Normal global wall motion. Doppler evidence of grade I (impaired) diastolic dysfunction, normal LAP. Calculated EF 64%. No significant valvular abnormality. No evidence of pulmonary hypertension.  Recent labs: 07/23/2022: Glucose 97, BUN/Cr 13/0.84. EGFR 70. Na/K 142/4.3.  BNP 26  April-August 2023: Glucose 93, BUN/Cr 12/0.7. EGFR 69. K 4.2 HbA1C 5.6% Chol 119, TG 84, HDL 54, LDL 48 TSH 1.5 normal  10/30/2019: Glucose 99, BUN/Cr 12/0.76. EGFR 75. Na/K 142/3.7. Rest of the CMP normal H/H 12/38. MCV 95.  Platelets 211 HbA1C 5.7% Chol 127, TG 111, HDL 54, LDL 64 TSH 4.7 normal    Review of Systems  Cardiovascular:  Positive for dyspnea on exertion (Improved). Negative for chest pain, leg swelling, palpitations and syncope.        Vitals:   11/16/22 1105  BP: 131/75  Pulse: (!) 58  SpO2: 95%     Body mass index is 30.36 kg/m. Filed Weights   11/16/22 1105  Weight: 171 lb 6.4 oz (77.7 kg)     Objective:   Physical Exam Vitals and nursing note reviewed.  Constitutional:      Appearance: She is well-developed.  Neck:     Vascular: No  JVD.  Cardiovascular:     Rate and Rhythm: Normal rate and regular rhythm.     Pulses: Intact distal pulses.     Heart sounds: Normal heart sounds. No murmur heard. Pulmonary:     Effort: Pulmonary effort is normal.     Breath sounds: Normal breath sounds. No wheezing or rales.  Musculoskeletal:     Right lower leg: No edema.     Left lower leg: No edema.           Assessment & Recommendations:   82 year old Caucasian female with history of endometrial cancer, currently in remission, recurrent history of DVT in 1997 and 2017, history of PE in 2017  Exertional dyspnea: Euvolemic. Continue lasix 40 mg daily. Given persistent exertional dyspnea, will check Lexiscan nuclear stress test.  H/o recurrent DVT/PE: Low-dose Eliquis 2.5 mg twice daily. Tolerating well  Hypertension: Controlled.  Hyperlipidemia: Continue Crestor  F/u after stress test    Nigel Mormon, MD Pager: [phone] Office: [phone]

## 2022-12-10 ENCOUNTER — Ambulatory Visit: Payer: Medicare Other

## 2022-12-10 DIAGNOSIS — R0609 Other forms of dyspnea: Secondary | ICD-10-CM

## 2022-12-11 NOTE — Progress Notes (Signed)
Called patient to inform her about her stress test. Patient understood.

## 2022-12-20 ENCOUNTER — Ambulatory Visit: Payer: Medicare Other | Admitting: Cardiology

## 2022-12-20 ENCOUNTER — Encounter: Payer: Self-pay | Admitting: Cardiology

## 2022-12-20 VITALS — BP 125/84 | HR 54 | Ht 63.0 in | Wt 168.0 lb

## 2022-12-20 DIAGNOSIS — I10 Essential (primary) hypertension: Secondary | ICD-10-CM

## 2022-12-20 DIAGNOSIS — R0609 Other forms of dyspnea: Secondary | ICD-10-CM

## 2022-12-20 MED ORDER — FUROSEMIDE 40 MG PO TABS: 40.0000 mg | ORAL_TABLET | ORAL | 3 refills | Status: DC | PRN

## 2022-12-20 NOTE — Progress Notes (Signed)
Follow up visit  Subjective:   [name], female    DOB: 1940-12-06, 82 y.o.   MRN: [id number]   HPI  Chief Complaint  Patient presents with   Dyspnea on exertion   Follow-up   Results    82 year old Caucasian female with history of endometrial cancer, currently in remission, recurrent history of DVT in 1997 and 2017, history of PE in 2017.  Exertional dyspnea improved but remains. No chest pain. Leg edema resolved.  Reviewed recent test results with the patient, details below.     Current Outpatient Medications:    acetaminophen (TYLENOL) 500 MG tablet, Take 500-1,000 mg by mouth every 8 (eight) hours as needed for mild pain or moderate pain., Disp: , Rfl:    apixaban (ELIQUIS) 2.5 MG TABS tablet, Take 1 tablet (2.5 mg total) by mouth 2 (two) times daily., Disp: 180 tablet, Rfl: 3   Calcium Carbonate (CALTRATE 600 PO), Take 1 tablet by mouth 3 (three) times daily., Disp: , Rfl:    calcium carbonate (TUMS - DOSED IN MG ELEMENTAL CALCIUM) 500 MG chewable tablet, Chew 1 tablet by mouth every 8 (eight) hours as needed for heartburn., Disp: , Rfl:    cholecalciferol (VITAMIN D) 1000 UNITS tablet, Take 500 Units by mouth daily., Disp: , Rfl:    CRESTOR 40 MG tablet, 20 mg. , Disp: , Rfl:    cyanocobalamin (VITAMIN B12) 1000 MCG tablet, Take 1,000 mcg by mouth daily., Disp: , Rfl:    denosumab (PROLIA) 60 MG/ML SOSY injection, Inject 60 mg into the skin every 6 (six) months., Disp: , Rfl:    estradiol (ESTRACE VAGINAL) 0.1 MG/GM vaginal cream, Place 1 Applicatorful vaginally 3 (three) times a week., Disp: 42.5 g, Rfl: 6   ferrous sulfate 325 (65 FE) MG EC tablet, Take 325 mg by mouth 3 (three) times daily with meals., Disp: , Rfl:    furosemide (LASIX) 40 MG tablet, Take 1 tablet (40 mg total) by mouth daily., Disp: 90 tablet, Rfl: 3   Multiple Vitamin (DAILY VALUE MULTIVITAMIN PO), Take 1 tablet by mouth daily., Disp: , Rfl:    potassium chloride SA (KLOR-CON M) 20 MEQ tablet,  Take 20 mEq by mouth 2 (two) times daily., Disp: , Rfl:    propranolol ER (INDERAL LA) 80 MG 24 hr capsule, Take 80 mg by mouth daily. , Disp: , Rfl:    psyllium (METAMUCIL) 58.6 % powder, Take 1 packet by mouth daily as needed. , Disp: , Rfl:    vitamin C (ASCORBIC ACID) 500 MG tablet, Take 500 mg by mouth daily., Disp: , Rfl:    Cardiovascular & other pertient studies:  Regadenoson (with Mod Bruce protocol) Nuclear stress test 12/10/2022: Myocardial perfusion is normal. Overall LV systolic function is normal without regional wall motion abnormalities. Stress LV EF: 78%.  Nondiagnostic ECG stress. The heart rate response was consistent with Adenosine. The blood pressure response was physiologic. No previous exam available for comparison.    Echocardiogram 07/31/2022: Left ventricle cavity is normal in size and wall thickness. Normal global wall motion. Normal LV systolic function with EF 67%. Doppler evidence of grade I (impaired) diastolic dysfunction, normal LAP. Mild to moderate mitral regurgitation. Mild tricuspid regurgitation. No evidence of pulmonary hypertension. Compared to previous study in 2018, MR, TR are new findings.   EKG 07/20/2022: Sinus rhythm 54 bpm Leftward axis Low voltage in precordial leads  Echocardiogram 08/20/2017: Left ventricle cavity is normal in size. Mild concentric hypertrophy of the left  ventricle. Normal global wall motion. Doppler evidence of grade I (impaired) diastolic dysfunction, normal LAP. Calculated EF 64%. No significant valvular abnormality. No evidence of pulmonary hypertension.  Recent labs: 07/23/2022: Glucose 97, BUN/Cr 13/0.84. EGFR 70. Na/K 142/4.3.  BNP 26  April-August 2023: Glucose 93, BUN/Cr 12/0.7. EGFR 69. K 4.2 HbA1C 5.6% Chol 119, TG 84, HDL 54, LDL 48 TSH 1.5 normal  10/30/2019: Glucose 99, BUN/Cr 12/0.76. EGFR 75. Na/K 142/3.7. Rest of the CMP normal H/H 12/38. MCV 95. Platelets 211 HbA1C 5.7% Chol 127, TG  111, HDL 54, LDL 64 TSH 4.7 normal    Review of Systems  Cardiovascular:  Positive for dyspnea on exertion (Improved). Negative for chest pain, leg swelling, palpitations and syncope.        Vitals:   12/20/22 1309  BP: 125/84  Pulse: (!) 54     Body mass index is 29.76 kg/m. Filed Weights   12/20/22 1309  Weight: 168 lb (76.2 kg)     Objective:   Physical Exam Vitals and nursing note reviewed.  Constitutional:      Appearance: She is well-developed.  Neck:     Vascular: No JVD.  Cardiovascular:     Rate and Rhythm: Normal rate and regular rhythm.     Pulses: Intact distal pulses.     Heart sounds: Normal heart sounds. No murmur heard. Pulmonary:     Effort: Pulmonary effort is normal.     Breath sounds: Normal breath sounds. No wheezing or rales.  Musculoskeletal:     Right lower leg: No edema.     Left lower leg: No edema.           Assessment & Recommendations:   82 year old Caucasian female with history of endometrial cancer, currently in remission, recurrent history of DVT in 1997 and 2017, history of PE in 2017  Exertional dyspnea: Euvolemic. No ischemia on stress testing. No obvious HFpEF. Okay to take lasix as needed.  H/o recurrent DVT/PE: Low-dose Eliquis 2.5 mg twice daily. Tolerating well  Hypertension: Controlled.  Hyperlipidemia: Continue Crestor  F/u in 1 year    Elder Negus, MD Pager: [phone] Office: [phone]

## 2023-03-06 ENCOUNTER — Ambulatory Visit: Payer: Self-pay | Admitting: Surgery

## 2023-03-08 ENCOUNTER — Encounter: Payer: Self-pay | Admitting: Cardiology

## 2023-03-25 ENCOUNTER — Other Ambulatory Visit (HOSPITAL_COMMUNITY): Payer: Self-pay | Admitting: *Deleted

## 2023-03-26 ENCOUNTER — Ambulatory Visit (HOSPITAL_COMMUNITY)
Admission: RE | Admit: 2023-03-26 | Discharge: 2023-03-26 | Disposition: A | Discharge status: Home / Self Care | Payer: Medicare Other | Source: Ambulatory Visit | Attending: Internal Medicine | Admitting: Internal Medicine

## 2023-03-26 DIAGNOSIS — M810 Age-related osteoporosis without current pathological fracture: Secondary | ICD-10-CM | POA: Diagnosis present

## 2023-03-26 MED ORDER — DENOSUMAB 60 MG/ML ~~LOC~~ SOSY
60.0000 mg | PREFILLED_SYRINGE | Freq: Once | SUBCUTANEOUS | Status: AC
  Administered 2023-03-26: 60 mg via SUBCUTANEOUS

## 2023-03-26 MED ORDER — DENOSUMAB 60 MG/ML ~~LOC~~ SOSY
PREFILLED_SYRINGE | SUBCUTANEOUS | Status: AC
  Filled 2023-03-26: qty 1

## 2023-08-10 ENCOUNTER — Other Ambulatory Visit: Payer: Self-pay | Admitting: Cardiology

## 2023-08-12 NOTE — Telephone Encounter (Signed)
Prescription refill request for Eliquis received. Indication:DVT Last office visit:4/24 Scr:0.94 Age: 82 Weight:76.2  KG  PRESCRIPTION REFILLED

## 2023-09-23 ENCOUNTER — Other Ambulatory Visit (HOSPITAL_COMMUNITY): Payer: Self-pay | Admitting: *Deleted

## 2023-09-26 ENCOUNTER — Ambulatory Visit (HOSPITAL_COMMUNITY)
Admission: RE | Admit: 2023-09-26 | Discharge: 2023-09-26 | Disposition: A | Discharge status: Home / Self Care | Payer: Medicare Other | Source: Ambulatory Visit | Attending: Internal Medicine | Admitting: Internal Medicine

## 2023-09-26 DIAGNOSIS — M810 Age-related osteoporosis without current pathological fracture: Secondary | ICD-10-CM | POA: Diagnosis present

## 2023-09-26 MED ORDER — DENOSUMAB 60 MG/ML ~~LOC~~ SOSY
60.0000 mg | PREFILLED_SYRINGE | Freq: Once | SUBCUTANEOUS | Status: AC
  Administered 2023-09-26: 60 mg via SUBCUTANEOUS

## 2023-09-26 MED ORDER — DENOSUMAB 60 MG/ML ~~LOC~~ SOSY
PREFILLED_SYRINGE | SUBCUTANEOUS | Status: AC
  Filled 2023-09-26: qty 1

## 2023-12-16 ENCOUNTER — Observation Stay (HOSPITAL_COMMUNITY)

## 2023-12-16 ENCOUNTER — Emergency Department (HOSPITAL_COMMUNITY)

## 2023-12-16 ENCOUNTER — Encounter (HOSPITAL_COMMUNITY): Payer: Self-pay

## 2023-12-16 ENCOUNTER — Other Ambulatory Visit: Payer: Self-pay

## 2023-12-16 ENCOUNTER — Inpatient Hospital Stay (HOSPITAL_COMMUNITY)
Admission: EM | Admit: 2023-12-16 | Discharge: 2023-12-27 | DRG: 065 | Disposition: A | Discharge status: Home Health | Attending: Internal Medicine | Admitting: Internal Medicine

## 2023-12-16 DIAGNOSIS — I639 Cerebral infarction, unspecified: Secondary | ICD-10-CM

## 2023-12-16 DIAGNOSIS — W19XXXA Unspecified fall, initial encounter: Principal | ICD-10-CM

## 2023-12-16 DIAGNOSIS — R0789 Other chest pain: Secondary | ICD-10-CM

## 2023-12-16 DIAGNOSIS — F419 Anxiety disorder, unspecified: Secondary | ICD-10-CM | POA: Diagnosis present

## 2023-12-16 DIAGNOSIS — Z923 Personal history of irradiation: Secondary | ICD-10-CM

## 2023-12-16 DIAGNOSIS — I82401 Acute embolism and thrombosis of unspecified deep veins of right lower extremity: Secondary | ICD-10-CM | POA: Diagnosis present

## 2023-12-16 DIAGNOSIS — I6340 Cerebral infarction due to embolism of unspecified cerebral artery: Principal | ICD-10-CM | POA: Diagnosis present

## 2023-12-16 DIAGNOSIS — E782 Mixed hyperlipidemia: Secondary | ICD-10-CM | POA: Diagnosis present

## 2023-12-16 DIAGNOSIS — K56609 Unspecified intestinal obstruction, unspecified as to partial versus complete obstruction: Secondary | ICD-10-CM

## 2023-12-16 DIAGNOSIS — R29818 Other symptoms and signs involving the nervous system: Secondary | ICD-10-CM | POA: Diagnosis present

## 2023-12-16 DIAGNOSIS — K560 Paralytic ileus: Secondary | ICD-10-CM | POA: Diagnosis present

## 2023-12-16 DIAGNOSIS — I951 Orthostatic hypotension: Secondary | ICD-10-CM | POA: Diagnosis present

## 2023-12-16 DIAGNOSIS — R471 Dysarthria and anarthria: Secondary | ICD-10-CM | POA: Diagnosis present

## 2023-12-16 DIAGNOSIS — K219 Gastro-esophageal reflux disease without esophagitis: Secondary | ICD-10-CM | POA: Diagnosis not present

## 2023-12-16 DIAGNOSIS — Z8542 Personal history of malignant neoplasm of other parts of uterus: Secondary | ICD-10-CM

## 2023-12-16 DIAGNOSIS — E876 Hypokalemia: Secondary | ICD-10-CM | POA: Diagnosis not present

## 2023-12-16 DIAGNOSIS — Z79899 Other long term (current) drug therapy: Secondary | ICD-10-CM

## 2023-12-16 DIAGNOSIS — Z86711 Personal history of pulmonary embolism: Secondary | ICD-10-CM | POA: Diagnosis present

## 2023-12-16 DIAGNOSIS — D72829 Elevated white blood cell count, unspecified: Secondary | ICD-10-CM | POA: Diagnosis present

## 2023-12-16 DIAGNOSIS — E872 Acidosis, unspecified: Secondary | ICD-10-CM | POA: Diagnosis not present

## 2023-12-16 DIAGNOSIS — I10 Essential (primary) hypertension: Secondary | ICD-10-CM | POA: Diagnosis present

## 2023-12-16 DIAGNOSIS — Z981 Arthrodesis status: Secondary | ICD-10-CM

## 2023-12-16 DIAGNOSIS — R079 Chest pain, unspecified: Secondary | ICD-10-CM | POA: Diagnosis present

## 2023-12-16 DIAGNOSIS — R4781 Slurred speech: Secondary | ICD-10-CM | POA: Diagnosis present

## 2023-12-16 DIAGNOSIS — R29810 Facial weakness: Secondary | ICD-10-CM

## 2023-12-16 DIAGNOSIS — Z8249 Family history of ischemic heart disease and other diseases of the circulatory system: Secondary | ICD-10-CM

## 2023-12-16 DIAGNOSIS — R001 Bradycardia, unspecified: Secondary | ICD-10-CM | POA: Diagnosis present

## 2023-12-16 DIAGNOSIS — R1310 Dysphagia, unspecified: Secondary | ICD-10-CM | POA: Diagnosis present

## 2023-12-16 DIAGNOSIS — Z9049 Acquired absence of other specified parts of digestive tract: Secondary | ICD-10-CM

## 2023-12-16 DIAGNOSIS — R29702 NIHSS score 2: Secondary | ICD-10-CM | POA: Diagnosis present

## 2023-12-16 DIAGNOSIS — R739 Hyperglycemia, unspecified: Secondary | ICD-10-CM | POA: Diagnosis present

## 2023-12-16 DIAGNOSIS — Z90722 Acquired absence of ovaries, bilateral: Secondary | ICD-10-CM

## 2023-12-16 DIAGNOSIS — I119 Hypertensive heart disease without heart failure: Secondary | ICD-10-CM | POA: Diagnosis present

## 2023-12-16 DIAGNOSIS — E860 Dehydration: Secondary | ICD-10-CM | POA: Diagnosis present

## 2023-12-16 DIAGNOSIS — Z86718 Personal history of other venous thrombosis and embolism: Secondary | ICD-10-CM

## 2023-12-16 DIAGNOSIS — Z90710 Acquired absence of both cervix and uterus: Secondary | ICD-10-CM

## 2023-12-16 DIAGNOSIS — Z7901 Long term (current) use of anticoagulants: Secondary | ICD-10-CM

## 2023-12-16 DIAGNOSIS — R000 Tachycardia, unspecified: Secondary | ICD-10-CM | POA: Diagnosis not present

## 2023-12-16 DIAGNOSIS — K3189 Other diseases of stomach and duodenum: Secondary | ICD-10-CM | POA: Diagnosis not present

## 2023-12-16 DIAGNOSIS — C549 Malignant neoplasm of corpus uteri, unspecified: Secondary | ICD-10-CM | POA: Diagnosis present

## 2023-12-16 LAB — CBC
HCT: 40 % (ref 36.0–46.0)
Hemoglobin: 13.2 g/dL (ref 12.0–15.0)
MCH: 32.4 pg (ref 26.0–34.0)
MCHC: 33 g/dL (ref 30.0–36.0)
MCV: 98 fL (ref 80.0–100.0)
Platelets: 187 10*3/uL (ref 150–400)
RBC: 4.08 MIL/uL (ref 3.87–5.11)
RDW: 13.7 % (ref 11.5–15.5)
WBC: 18.9 10*3/uL — ABNORMAL HIGH (ref 4.0–10.5)
nRBC: 0 % (ref 0.0–0.2)

## 2023-12-16 LAB — DIFFERENTIAL
Abs Immature Granulocytes: 0.14 10*3/uL — ABNORMAL HIGH (ref 0.00–0.07)
Basophils Absolute: 0 10*3/uL (ref 0.0–0.1)
Basophils Relative: 0 %
Eosinophils Absolute: 0 10*3/uL (ref 0.0–0.5)
Eosinophils Relative: 0 %
Immature Granulocytes: 1 %
Lymphocytes Relative: 4 %
Lymphs Abs: 0.7 10*3/uL (ref 0.7–4.0)
Monocytes Absolute: 1.1 10*3/uL — ABNORMAL HIGH (ref 0.1–1.0)
Monocytes Relative: 6 %
Neutro Abs: 16.8 10*3/uL — ABNORMAL HIGH (ref 1.7–7.7)
Neutrophils Relative %: 89 %

## 2023-12-16 LAB — I-STAT CHEM 8, ED
BUN: 14 mg/dL (ref 8–23)
Calcium, Ion: 0.95 mmol/L — ABNORMAL LOW (ref 1.15–1.40)
Chloride: 102 mmol/L (ref 98–111)
Creatinine, Ser: 0.8 mg/dL (ref 0.44–1.00)
Glucose, Bld: 173 mg/dL — ABNORMAL HIGH (ref 70–99)
HCT: 41 % (ref 36.0–46.0)
Hemoglobin: 13.9 g/dL (ref 12.0–15.0)
Potassium: 3.6 mmol/L (ref 3.5–5.1)
Sodium: 137 mmol/L (ref 135–145)
TCO2: 25 mmol/L (ref 22–32)

## 2023-12-16 LAB — COMPREHENSIVE METABOLIC PANEL WITH GFR
ALT: 21 U/L (ref 0–44)
AST: 32 U/L (ref 15–41)
Albumin: 3.2 g/dL — ABNORMAL LOW (ref 3.5–5.0)
Alkaline Phosphatase: 18 U/L — ABNORMAL LOW (ref 38–126)
Anion gap: 12 (ref 5–15)
BUN: 14 mg/dL (ref 8–23)
CO2: 24 mmol/L (ref 22–32)
Calcium: 8.7 mg/dL — ABNORMAL LOW (ref 8.9–10.3)
Chloride: 104 mmol/L (ref 98–111)
Creatinine, Ser: 0.84 mg/dL (ref 0.44–1.00)
GFR, Estimated: >60 mL/min (ref >60)
Glucose, Bld: 175 mg/dL — ABNORMAL HIGH (ref 70–99)
Potassium: 3.7 mmol/L (ref 3.5–5.1)
Sodium: 140 mmol/L (ref 135–145)
Total Bilirubin: 0.7 mg/dL (ref 0.0–1.2)
Total Protein: 6.4 g/dL — ABNORMAL LOW (ref 6.5–8.1)

## 2023-12-16 LAB — PROTIME-INR
INR: 1 (ref 0.8–1.2)
Prothrombin Time: 13.8 s (ref 11.4–15.2)

## 2023-12-16 LAB — APTT: aPTT: 26 s (ref 24–36)

## 2023-12-16 LAB — TROPONIN I (HIGH SENSITIVITY): Troponin I (High Sensitivity): 15 ng/L (ref <18)

## 2023-12-16 LAB — CBG MONITORING, ED: Glucose-Capillary: 169 mg/dL — ABNORMAL HIGH (ref 70–99)

## 2023-12-16 LAB — ETHANOL: Alcohol, Ethyl (B): <10 mg/dL (ref <10)

## 2023-12-16 MED ORDER — POLYETHYLENE GLYCOL 3350 17 G PO PACK: 17.0000 g | PACK | Freq: Every day | ORAL | Status: DC | PRN

## 2023-12-16 MED ORDER — ACETAMINOPHEN 325 MG PO TABS
650.0000 mg | ORAL_TABLET | Freq: Four times a day (QID) | ORAL | Status: DC | PRN
  Administered 2023-12-17 – 2023-12-19 (×6): 650 mg via ORAL
  Filled 2023-12-16 (×6): qty 2

## 2023-12-16 MED ORDER — LOPERAMIDE HCL 2 MG PO CAPS
2.0000 mg | ORAL_CAPSULE | ORAL | Status: DC | PRN
  Administered 2023-12-19: 2 mg via ORAL
  Filled 2023-12-16: qty 1

## 2023-12-16 MED ORDER — ROSUVASTATIN CALCIUM 20 MG PO TABS
20.0000 mg | ORAL_TABLET | Freq: Every day | ORAL | Status: DC
  Administered 2023-12-17 – 2023-12-27 (×8): 20 mg via ORAL
  Filled 2023-12-16 (×9): qty 1

## 2023-12-16 MED ORDER — SODIUM CHLORIDE 0.9% FLUSH
3.0000 mL | Freq: Two times a day (BID) | INTRAVENOUS | Status: DC
  Administered 2023-12-16 – 2023-12-27 (×19): 3 mL via INTRAVENOUS

## 2023-12-16 MED ORDER — SODIUM CHLORIDE 0.9 % IV SOLN: INTRAVENOUS | Status: DC

## 2023-12-16 MED ORDER — APIXABAN 2.5 MG PO TABS
2.5000 mg | ORAL_TABLET | Freq: Two times a day (BID) | ORAL | Status: DC
  Administered 2023-12-16: 2.5 mg via ORAL
  Filled 2023-12-16: qty 1

## 2023-12-16 MED ORDER — ACETAMINOPHEN 650 MG RE SUPP: 650.0000 mg | Freq: Four times a day (QID) | RECTAL | Status: DC | PRN

## 2023-12-16 MED ORDER — SODIUM CHLORIDE 0.9% FLUSH
3.0000 mL | Freq: Once | INTRAVENOUS | Status: AC
  Administered 2023-12-16: 3 mL via INTRAVENOUS

## 2023-12-16 NOTE — ED Triage Notes (Signed)
 Patient brought immediately from pov registration to charge desk upon arrival. Patient presents with dizziness, slurred speech and facial asymmetry. Patient evaluated by Dr Synetta Eves at charge desk and patient reports that she awoke with diarrhea at 0730 this morning than had syncopal event and fell off commode. Complains of chest wall pain. Patient pale on arrival, alert and oriented.  She did not notice her slurred speech or mouth droop.  LKW determined to be sometime yesterday am

## 2023-12-16 NOTE — Plan of Care (Signed)
 Discussed with Dr. Synetta Eves, concern for new facial droop in the setting of diarrhea, dizziness, leukocytosis, last known well 4/13   Not entirely clear if this is an acute stroke vs. recrudesence in the setting of an infectious process with negative head CT  Recommend MRI brain, neurology consult and stroke workup if positive for acute stroke, noting patient is already on Eliquis therefore largely medically optimized from stroke risk perspective but could benefit from vascular imaging (CTA head and neck) to confirm no critical stenosis if stroke is found.   Please do reach out if any other neurological questions/concerns arise, neurology is not actively following this patient at this time.   These are curbside recommendations based upon the information readily available in the chart on brief review as well as history and examination information provided to me by requesting provider and do not replace a full detailed consult   Baldwin Levee MD-PhD [facility] [phone] Available 7 AM to 7 PM, outside these hours please contact Neurologist on call listed on AMION   Basic Metabolic Panel: Recent Labs  Lab 12/16/23 1317 12/16/23 1322  NA 140 137  K 3.7 3.6  CL 104 102  CO2 24  --   GLUCOSE 175* 173*  BUN 14 14  CREATININE 0.84 0.80  CALCIUM 8.7*  --     CBC: Recent Labs  Lab 12/16/23 1317 12/16/23 1322  WBC 18.9*  --   NEUTROABS 16.8*  --   HGB 13.2 13.9  HCT 40.0 41.0  MCV 98.0  --   PLT 187  --     Coagulation Studies: Recent Labs    12/16/23 1317  LABPROT 13.8  INR 1.0

## 2023-12-16 NOTE — ED Provider Notes (Signed)
 [place] EMERGENCY DEPARTMENT AT [facility] Provider Note   CSN: [id number] Arrival date & time: 12/16/23  1304     History  No chief complaint on file.   [name] is a 83 y.o. female.  HPI 83 year old female history of uterine cancer, DVT, PE, anticoagulants listed, hyperlipidemia, hypertension, presents today with last known normal yesterday at church and new onset of facial droop and word slurring.  Patient states that she got up this morning and had to go to the bathroom and had diarrhea..  She felt generally weak.  She did not look in the mirror.  She reports calling her doctors office and being told to come to the ED.  She did not know or noticed that she had left facial droop or word slurring.  She has not noted any arm or leg weakness, numbness, tingling.  She reported some dizziness.     Home Medications Prior to Admission medications   Medication Sig Start Date End Date Taking? Authorizing Provider  acetaminophen (TYLENOL) 500 MG tablet Take 500-1,000 mg by mouth every 8 (eight) hours as needed for mild pain or moderate pain.   Yes [provider]  Calcium Carbonate (CALTRATE 600 PO) Take 1 tablet by mouth in the morning and at bedtime.   Yes [provider]  cholecalciferol (VITAMIN D) 1000 UNITS tablet Take 1,000 Units by mouth daily.   Yes [provider]  CRESTOR 40 MG tablet 20 mg.  06/09/16  Yes [provider]  cyanocobalamin (VITAMIN B12) 1000 MCG tablet Take 1,000 mcg by mouth daily.   Yes [provider]  denosumab (PROLIA) 60 MG/ML SOSY injection Inject 60 mg into the skin every 6 (six) months.   Yes [provider]  ELIQUIS 2.5 MG TABS tablet TAKE 1 TABLET BY MOUTH TWICE  DAILY 08/12/23  Yes Patwardhan, Manish J, MD  ferrous sulfate 325 (65 FE) MG EC tablet Take 325 mg by mouth 3 (three) times daily with meals.   Yes [provider]  furosemide (LASIX) 40 MG tablet Take 1 tablet (40 mg  total) by mouth as needed. Patient taking differently: Take 40 mg by mouth daily. 12/20/22  Yes Patwardhan, Manish J, MD  Multiple Vitamin (DAILY VALUE MULTIVITAMIN PO) Take 1 tablet by mouth daily.   Yes [provider]  potassium chloride SA (KLOR-CON M) 20 MEQ tablet Take 20 mEq by mouth 2 (two) times daily.   Yes [provider]  propranolol ER (INDERAL LA) 60 MG 24 hr capsule Take 60 mg by mouth daily. 11/29/11  Yes [provider]  psyllium (METAMUCIL) 58.6 % powder Take 1 packet by mouth daily as needed (constipation).   Yes [provider]  vitamin C (ASCORBIC ACID) 500 MG tablet Take 500 mg by mouth daily. 07/22/11  Yes [provider]      Allergies    Patient has no known allergies.    Review of Systems   Review of Systems  Physical Exam Updated Vital Signs BP (!) 124/54   Pulse (!) 58   Temp 97.8 F (36.6 C) (Oral)   Resp (!) 25   SpO2 96%  Physical Exam Vitals reviewed.  HENT:     Head: Normocephalic and atraumatic.     Comments: Left facial droop noted    Right Ear: External ear normal.     Left Ear: External ear normal.     Nose: Nose normal.     Mouth/Throat:  Pharynx: Oropharynx is clear.  Eyes:     Pupils: Pupils are equal, round, and reactive to light.  Cardiovascular:     Rate and Rhythm: Normal rate and regular rhythm.     Pulses: Normal pulses.  Pulmonary:     Effort: Pulmonary effort is normal.     Breath sounds: Normal breath sounds.  Abdominal:     General: Abdomen is flat.     Palpations: Abdomen is soft.  Musculoskeletal:        General: Normal range of motion.     Cervical back: Normal range of motion.  Skin:    General: Skin is warm and dry.     Capillary Refill: Capillary refill takes less than 2 seconds.  Neurological:     Mental Status: She is alert.  Psychiatric:        Mood and Affect: Mood normal.     ED Results / Procedures / Treatments   Labs (all labs ordered are listed, but  only abnormal results are displayed) Labs Reviewed  CBC - Abnormal; Notable for the following components:      Result Value   WBC 18.9 (*)    All other components within normal limits  DIFFERENTIAL - Abnormal; Notable for the following components:   Neutro Abs 16.8 (*)    Monocytes Absolute 1.1 (*)    Abs Immature Granulocytes 0.14 (*)    All other components within normal limits  COMPREHENSIVE METABOLIC PANEL WITH GFR - Abnormal; Notable for the following components:   Glucose, Bld 175 (*)    Calcium 8.7 (*)    Total Protein 6.4 (*)    Albumin 3.2 (*)    Alkaline Phosphatase 18 (*)    All other components within normal limits  CBG MONITORING, ED - Abnormal; Notable for the following components:   Glucose-Capillary 169 (*)    All other components within normal limits  I-STAT CHEM 8, ED - Abnormal; Notable for the following components:   Glucose, Bld 173 (*)    Calcium, Ion 0.95 (*)    All other components within normal limits  PROTIME-INR  APTT  ETHANOL  CBG MONITORING, ED  TROPONIN I (HIGH SENSITIVITY)    EKG None EKG nsr no acute abnormality Unable to read in muse Radiology CT HEAD WO CONTRAST Result Date: 12/16/2023 CLINICAL DATA:  Neuro deficit, concern for stroke. Dizziness, slurred speech, facial asymmetry. EXAM: CT HEAD WITHOUT CONTRAST TECHNIQUE: Contiguous axial images were obtained from the base of the skull through the vertex without intravenous contrast. RADIATION DOSE REDUCTION: This exam was performed according to the departmental dose-optimization program which includes automated exposure control, adjustment of the mA and/or kV according to patient size and/or use of iterative reconstruction technique. COMPARISON:  None Available. FINDINGS: Brain: No acute intracranial hemorrhage. No CT evidence of acute infarct. Nonspecific hypoattenuation in the periventricular and subcortical white matter favored to reflect chronic microvascular ischemic changes. Small remote  infarcts in the left subinsular region and within the parasagittal right frontal lobe. No edema, mass effect, or midline shift. The basilar cisterns are patent. Ventricles: Prominence of the ventricles suggesting underlying parenchymal volume loss. Vascular: Atherosclerotic calcifications of the carotid siphons. No hyperdense vessel. Skull: No acute or aggressive finding. Orbits: Orbits are symmetric. Sinuses: Mucosal thickening in the right maxillary sinus with findings suggestive of mucoperiosteal reaction. 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 Other: Mastoid air cells are clear. IMPRESSION: No CT evidence of acute intracranial abnormality. Mild chronic microvascular ischemic changes and mild parenchymal  volume loss. 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 Electronically Signed   By: Emily Filbert M.D.   On: 12/16/2023 15:54    Procedures .Critical Care  Performed by: Margarita Grizzle, MD Authorized by: Margarita Grizzle, MD   Critical care provider statement:    Critical care time (minutes):  60   Critical care end time:  12/16/2023 4:30 PM   Critical care was necessary to treat or prevent imminent or life-threatening deterioration of the following conditions:  CNS failure or compromise   Critical care was time spent personally by me on the following activities:  Development of treatment plan with patient or surrogate, discussions with consultants, evaluation of patient's response to treatment, examination of patient, ordering and review of laboratory studies, ordering and review of radiographic studies, ordering and performing treatments and interventions, pulse oximetry, re-evaluation of patient's condition and review of old charts     Medications Ordered in ED Medications  sodium chloride flush (NS) 0.9 % injection 3 mL (has no administration in time range)  rosuvastatin (CRESTOR) tablet 20 mg (has no administration in time range)  apixaban (ELIQUIS)  tablet 2.5 mg (has no administration in time range)  sodium chloride flush (NS) 0.9 % injection 3 mL (has no administration in time range)  acetaminophen (TYLENOL) tablet 650 mg (has no administration in time range)    Or  acetaminophen (TYLENOL) suppository 650 mg (has no administration in time range)  polyethylene glycol (MIRALAX / GLYCOLAX) packet 17 g (has no administration in time range)  loperamide (IMODIUM) capsule 2 mg (has no administration in time range)  0.9 %  sodium chloride infusion (has no administration in time range)    ED Course/ Medical Decision Making/ A&P Clinical Course as of 12/16/23 1630  Mon Dec 16, 2023  1558 CT reviewed interpreted no evidence of acute abnormality noted on my interpretation and radiologist interpretation concurs [DR]  1559 CBC reviewed interpreted white blood cell count elevated 18,900 otherwise within normal limits Complete metabolic panel reviewed interpreted glucose elevated at 175 otherwise within normal limits [DR]    Clinical Course User Index [DR] Margarita Grizzle, MD                                 Medical Decision Making Amount and/or Complexity of Data Reviewed Labs: ordered. Radiology: ordered.   1 facial droop left palmar drift.  Patient with last known normal yesterday at noon Differential diagnosis includes but not limited to Bell's palsy versus stroke.  Symptoms definitive on face difficult to tell if there is left arm weakness or not.  CT without acute abnormality. Discussed with neurology and advised patient will need MRI and they will consult 2 diarrhea patient with episodes of diarrhea this morning.  This may have contributed to the symptoms.  Here she is hemodynamically stable.  She has a leukocytosis of 18,000.  Abdomen is soft and nontender.  Patient will need some ongoing hydration and may need further evaluation depending upon symptomatology 3- syncopal episode this am-EKG nsr bp stable, labs normal.  Will check  cxr 4-chest pain from fall, cxr pending  Discussed with hospitalist and will see for admission       Final Clinical Impression(s) / ED Diagnoses Final diagnoses:  Fall, initial encounter  Chronic anticoagulation  Facial droop  Chest wall pain    Rx / DC Orders ED Discharge Orders  None         Auston Blush, MD 12/16/23 1630

## 2023-12-16 NOTE — H&P (Signed)
 History and Physical   [name] GMW:[id number] DOB: 1941/03/19 DOA: 12/16/2023  PCP: Windell Hasty, DO   Patient coming from: Home  Chief Complaint: Focal neurologic deficit, syncope, diarrhea  HPI: [name] is a 83 y.o. female with medical history significant of hypertension, hyperlipidemia, GERD, uterine cancer, PE, DVT presenting with focal neurologic deficits.  Patient was last normal at church yesterday as far as we can tell.  She got up this morning and had not noticed her deficits nor looked in the mirror.  She reports that this morning she had multiple episodes of diarrhea described asmixed solid and watery but more watery later.  She was feeling generally weak and little bit dizzy and ultimately had a syncopal event and struck her chest (likely on the tub). Did have loss of consciousness.  She called her doctor following this and they recommended evaluation at the emergency department and then she called her friend to bring her to the emergency department.  When her friend came to pick her up she noted that patient had slurred speech and facial droop which was persistent on arrival to the ED.  Patient reports chest pain from fall.  Patient denies fevers, chills, shortness of breath, abdominal pain, constipation, nausea, vomiting.  ED Course: Vital signs in the ED notable for heart rate in the 50s, blood pressure in the 120s-140s systolic, respiratory rate in the 20s.  Lab workup included CMP with glucose 175, calcium 8.7, protein 6.4, albumin 3.2.  CBC showed leukocytosis to 18.9.  PT, PTT, INR within normal limits.  Ethanol level negative.  CT head showed no acute abnormality but did demonstrate remote infarcts.  Chest x-ray pending.  Patient had no further diarrhea in the ED thus far.  Neurology consulted recommended MRI and to consult and plan for CVA workup if positive.  Review of Systems: As per HPI otherwise all other systems reviewed and are negative.  Past Medical  History:  Diagnosis Date   Anxiety    Arthritis    DVT (deep venous thrombosis) (HCC)    Endometrial cancer (HCC) dx'd 01/2008   recurrences 08/2011; 01/2012; xrt comp 03/2012   GERD (gastroesophageal reflux disease)    Hyperlipidemia    Hypertension    Migraines    Radiation 10/2011   External Beam/Intracavitary   S/P radiation therapy 01/29/12 - 03/10/12   Periaortic area/ 5600 cgy/ 28 Fractions    Past Surgical History:  Procedure Laterality Date   ANTERIOR LATERAL LUMBAR FUSION 4 LEVELS Left 05/03/2016   Procedure: Lumbar three Corpectomy  with Lumbar one to lumbar five fixation fusion with percutaneous pedicle screws: left hemilaminectomy open reduction fracture fragment;  Surgeon: Raelene Bullocks Ditty, MD;  Location: [facility];  Service: Neurosurgery;  Laterality: Left;   CHOLECYSTECTOMY     EYE SURGERY     LUMBAR PERCUTANEOUS PEDICLE SCREW 4 LEVEL Bilateral 05/03/2016   Procedure: LUMBAR PERCUTANEOUS PEDICLE SCREW 4 LEVEL;  Surgeon: Raelene Bullocks Ditty, MD;  Location: [facility];  Service: Neurosurgery;  Laterality: Bilateral;   TOTAL ABDOMINAL HYSTERECTOMY W/ BILATERAL SALPINGOOPHORECTOMY  2009    Social History  reports that she has never smoked. She has never used smokeless tobacco. She reports that she does not drink alcohol and does not use drugs.  No Known Allergies  Family History  Problem Relation Age of Onset   Heart failure Mother    Ulcers Father   Reviewed on admission  Prior to Admission medications   Medication Sig Start Date  End Date Taking? Authorizing Provider  acetaminophen (TYLENOL) 500 MG tablet Take 500-1,000 mg by mouth every 8 (eight) hours as needed for mild pain or moderate pain.   Yes [provider]  Calcium Carbonate (CALTRATE 600 PO) Take 1 tablet by mouth in the morning and at bedtime.   Yes [provider]  cholecalciferol (VITAMIN D) 1000 UNITS tablet Take 1,000 Units by mouth daily.   Yes [provider]   CRESTOR 40 MG tablet 20 mg.  06/09/16  Yes [provider]  cyanocobalamin (VITAMIN B12) 1000 MCG tablet Take 1,000 mcg by mouth daily.   Yes [provider]  denosumab (PROLIA) 60 MG/ML SOSY injection Inject 60 mg into the skin every 6 (six) months.   Yes [provider]  ELIQUIS 2.5 MG TABS tablet TAKE 1 TABLET BY MOUTH TWICE  DAILY 08/12/23  Yes Patwardhan, Manish J, MD  ferrous sulfate 325 (65 FE) MG EC tablet Take 325 mg by mouth 3 (three) times daily with meals.   Yes [provider]  furosemide (LASIX) 40 MG tablet Take 1 tablet (40 mg total) by mouth as needed. Patient taking differently: Take 40 mg by mouth daily. 12/20/22  Yes Patwardhan, Manish J, MD  Multiple Vitamin (DAILY VALUE MULTIVITAMIN PO) Take 1 tablet by mouth daily.   Yes [provider]  potassium chloride SA (KLOR-CON M) 20 MEQ tablet Take 20 mEq by mouth 2 (two) times daily.   Yes [provider]  propranolol ER (INDERAL LA) 60 MG 24 hr capsule Take 60 mg by mouth daily. 11/29/11  Yes [provider]  psyllium (METAMUCIL) 58.6 % powder Take 1 packet by mouth daily as needed (constipation).   Yes [provider]  vitamin C (ASCORBIC ACID) 500 MG tablet Take 500 mg by mouth daily. 07/22/11  Yes [provider]    Physical Exam: Vitals:   12/16/23 1430 12/16/23 1500 12/16/23 1530 12/16/23 1600  BP: 123/88 126/69 (!) 127/46 (!) 124/54  Pulse: (!) 57 (!) 55 (!) 58 (!) 58  Resp: (!) 25 (!) 22 (!) 21 (!) 25  Temp:      TempSrc:      SpO2: 100% 95% 96% 96%    Physical Exam Constitutional:      General: She is not in acute distress.    Appearance: Normal appearance.  HENT:     Head: Normocephalic and atraumatic.     Mouth/Throat:     Mouth: Mucous membranes are moist.     Pharynx: Oropharynx is clear.  Eyes:     Extraocular Movements: Extraocular movements intact.     Pupils: Pupils are equal, round, and reactive to light.   Cardiovascular:     Rate and Rhythm: Normal rate and regular rhythm.     Pulses: Normal pulses.     Heart sounds: Normal heart sounds.  Pulmonary:     Effort: Pulmonary effort is normal. No respiratory distress.     Breath sounds: Normal breath sounds.  Abdominal:     General: Bowel sounds are normal. There is no distension.     Palpations: Abdomen is soft.     Tenderness: There is no abdominal tenderness.  Musculoskeletal:        General: No swelling or deformity.  Skin:    General: Skin is warm and dry.  Neurological:     Comments: Mental Status: Patient is awake, alert, oriented No signs of aphasia or neglect Cranial Nerves: II: Pupils equal, round, and reactive  to light.   III,IV, VI: EOMI without ptosis or diploplia.  V: Facial sensation is symmetric to light touch. VII: Mild left facial droop, mild slurred speech VIII: hearing is intact to voice X: Uvula elevates XI: Shoulder shrug is symmetric. XII: Left deviation of tongue with protrusion Motor: Good effort thorughout, at Least 5/5 bilateral UE, 5/5 bilateral lower extremitiy  Sensory: Sensation is grossly intact bilateral UEs & LEs Cerebellar: Finger-Nose intact bilalat    Labs on Admission: I have personally reviewed following labs and imaging studies  CBC: Recent Labs  Lab 12/16/23 1317 12/16/23 1322  WBC 18.9*  --   NEUTROABS 16.8*  --   HGB 13.2 13.9  HCT 40.0 41.0  MCV 98.0  --   PLT 187  --     Basic Metabolic Panel: Recent Labs  Lab 12/16/23 1317 12/16/23 1322  NA 140 137  K 3.7 3.6  CL 104 102  CO2 24  --   GLUCOSE 175* 173*  BUN 14 14  CREATININE 0.84 0.80  CALCIUM 8.7*  --     GFR: CrCl cannot be calculated (Unknown ideal weight.).  Liver Function Tests: Recent Labs  Lab 12/16/23 1317  AST 32  ALT 21  ALKPHOS 18*  BILITOT 0.7  PROT 6.4*  ALBUMIN 3.2*    Urine analysis: No results found for: "COLORURINE", "APPEARANCEUR", "LABSPEC", "PHURINE", "GLUCOSEU", "HGBUR",  "BILIRUBINUR", "KETONESUR", "PROTEINUR", "UROBILINOGEN", "NITRITE", "LEUKOCYTESUR"  Radiological Exams on Admission: CT HEAD WO CONTRAST Result Date: 12/16/2023 CLINICAL DATA:  Neuro deficit, concern for stroke. Dizziness, slurred speech, facial asymmetry. EXAM: CT HEAD WITHOUT CONTRAST TECHNIQUE: Contiguous axial images were obtained from the base of the skull through the vertex without intravenous contrast. RADIATION DOSE REDUCTION: This exam was performed according to the departmental dose-optimization program which includes automated exposure control, adjustment of the mA and/or kV according to patient size and/or use of iterative reconstruction technique. COMPARISON:  None Available. FINDINGS: Brain: No acute intracranial hemorrhage. No CT evidence of acute infarct. Nonspecific hypoattenuation in the periventricular and subcortical white matter favored to reflect chronic microvascular ischemic changes. 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 No edema, mass effect, or midline shift. The basilar cisterns are patent. Ventricles: Prominence of the ventricles suggesting underlying parenchymal volume loss. Vascular: Atherosclerotic calcifications of the carotid siphons. No hyperdense vessel. Skull: No acute or aggressive finding. Orbits: Orbits are symmetric. Sinuses: Mucosal thickening in the right maxillary sinus with findings suggestive of mucoperiosteal reaction. 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 Other: Mastoid air cells are clear. IMPRESSION: No CT evidence of acute intracranial abnormality. Mild chronic microvascular ischemic changes and mild parenchymal volume loss. 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 Electronically Signed   By: Denny Flack M.D.   On: 12/16/2023 15:54   EKG: Independently reviewed.  Sinus rhythm 57 bpm.  Nonspecific T wave changes.  Assessment/Plan Principal Problem:    Focal neurological deficit Active Problems:   Malignant neoplasm of corpus uteri, except isthmus (HCC)   Right leg DVT (HCC)   Hx of pulmonary embolus   Essential hypertension   Mixed hyperlipidemia   Focal neurologic deficits > Presenting with new onset facial droop and word slurring.  Patient had not noticed it initially but friend did and it is persistent in the ED. > No history of prior stroke.  CT head showed no acute normality but did show some remote infarcts which may have been asymptomatic. >  Neurology consulted and recommended MRI brain and to reconsult if positive for acute stroke. - Appreciate neurology's input, will consult as needed - Monitor on telemetry - MRI brain - Stroke swallow screen - Permissive hypertension for now  Diarrhea > Patient with several episodes of diarrhea this morning.  None in the ED. > Does have leukocytosis as below but that may be reactive.  No abdominal pain. - IV fluids - As needed Imodium - Further intervention pending WBC trend and clinical course  Leukocytosis > WBCs 18.9 in the ED. > Suspicious for reactive.  Did have a fall/syncope where she struck her chest. > Though GI illness is in the differential, only had symptoms this morning. - Trend fever curve and WBC - Supportive care  Syncope - On telemetry - Orthostatic vital signs - Echocardiogram  Hypertension - Holding propranolol in the setting of bradycardia and permissive hypertension - Holding Lasix  Hyperlipidemia - Continue rosuvastatin  History of PE and DVT - Continue Eliquis  History of uterine cancer - Noted  DVT prophylaxis: Eliquis Code Status:   Full Family Communication:  None on admission, was accompanied to ED by friend who is contact person.   Disposition Plan:   Patient is from:  Home  Anticipated DC to:  Home  Anticipated DC date:  1 to  2 days  Anticipated DC barriers: None  Consults called:  None (EDP discussed with neurology, but not  formally consulted) Admission status:  Observation, telemetry  Severity of Illness: The appropriate patient status for this patient is OBSERVATION. Observation status is judged to be reasonable and necessary in order to provide the required intensity of service to ensure the patient's safety. The patient's presenting symptoms, physical exam findings, and initial radiographic and laboratory data in the context of their medical condition is felt to place them at decreased risk for further clinical deterioration. Furthermore, it is anticipated that the patient will be medically stable for discharge from the hospital within 2 midnights of admission.   Johnetta Nab MD [facility]  How to contact the [facility] Attending or Consulting provider 7A - 7P or covering provider during after hours 7P -7A, for this patient?   Check the care team in [facility] and look for a) attending/consulting [facility] provider listed and b) the [facility] team listed Log into [email or web address] and use [facility] universal password to access. If you do not have the password, please contact the hospital operator. Locate the [facility] provider you are looking for under [facility] and page to a number that you can be directly reached. If you still have difficulty reaching the provider, please page the [facility] (Director on Call) for the Hospitalists listed on amion for assistance.  12/16/2023, 4:29 PM

## 2023-12-17 ENCOUNTER — Observation Stay (HOSPITAL_BASED_OUTPATIENT_CLINIC_OR_DEPARTMENT_OTHER)

## 2023-12-17 ENCOUNTER — Observation Stay (HOSPITAL_COMMUNITY)

## 2023-12-17 DIAGNOSIS — I639 Cerebral infarction, unspecified: Secondary | ICD-10-CM | POA: Diagnosis not present

## 2023-12-17 DIAGNOSIS — R55 Syncope and collapse: Secondary | ICD-10-CM | POA: Diagnosis not present

## 2023-12-17 DIAGNOSIS — R29818 Other symptoms and signs involving the nervous system: Secondary | ICD-10-CM | POA: Diagnosis not present

## 2023-12-17 LAB — COMPREHENSIVE METABOLIC PANEL WITH GFR
ALT: 19 U/L (ref 0–44)
AST: 32 U/L (ref 15–41)
Albumin: 3 g/dL — ABNORMAL LOW (ref 3.5–5.0)
Alkaline Phosphatase: 20 U/L — ABNORMAL LOW (ref 38–126)
Anion gap: 12 (ref 5–15)
BUN: 11 mg/dL (ref 8–23)
CO2: 29 mmol/L (ref 22–32)
Calcium: 8.4 mg/dL — ABNORMAL LOW (ref 8.9–10.3)
Chloride: 98 mmol/L (ref 98–111)
Creatinine, Ser: 0.95 mg/dL (ref 0.44–1.00)
GFR, Estimated: 60 mL/min — ABNORMAL LOW (ref >60)
Glucose, Bld: 128 mg/dL — ABNORMAL HIGH (ref 70–99)
Potassium: 3 mmol/L — ABNORMAL LOW (ref 3.5–5.1)
Sodium: 139 mmol/L (ref 135–145)
Total Bilirubin: 1.2 mg/dL (ref 0.0–1.2)
Total Protein: 6.2 g/dL — ABNORMAL LOW (ref 6.5–8.1)

## 2023-12-17 LAB — URINALYSIS, COMPLETE (UACMP) WITH MICROSCOPIC
Bacteria, UA: NONE SEEN
Bilirubin Urine: NEGATIVE
Glucose, UA: NEGATIVE mg/dL
Hgb urine dipstick: NEGATIVE
Ketones, ur: NEGATIVE mg/dL
Leukocytes,Ua: NEGATIVE
Nitrite: NEGATIVE
Protein, ur: NEGATIVE mg/dL
Specific Gravity, Urine: >1.046 — ABNORMAL HIGH (ref 1.005–1.030)
pH: 8 (ref 5.0–8.0)

## 2023-12-17 LAB — CBC
HCT: 37 % (ref 36.0–46.0)
Hemoglobin: 12.7 g/dL (ref 12.0–15.0)
MCH: 32.2 pg (ref 26.0–34.0)
MCHC: 34.3 g/dL (ref 30.0–36.0)
MCV: 93.7 fL (ref 80.0–100.0)
Platelets: 176 10*3/uL (ref 150–400)
RBC: 3.95 MIL/uL (ref 3.87–5.11)
RDW: 13.7 % (ref 11.5–15.5)
WBC: 15.6 10*3/uL — ABNORMAL HIGH (ref 4.0–10.5)
nRBC: 0 % (ref 0.0–0.2)

## 2023-12-17 LAB — ECHOCARDIOGRAM COMPLETE BUBBLE STUDY
Area-P 1/2: 3.65 cm2
S' Lateral: 2.9 cm

## 2023-12-17 LAB — TROPONIN I (HIGH SENSITIVITY): Troponin I (High Sensitivity): 14 ng/L (ref <18)

## 2023-12-17 MED ORDER — POTASSIUM CHLORIDE CRYS ER 20 MEQ PO TBCR
40.0000 meq | EXTENDED_RELEASE_TABLET | ORAL | Status: AC
  Administered 2023-12-17 (×2): 40 meq via ORAL
  Filled 2023-12-17 (×2): qty 2

## 2023-12-17 MED ORDER — APIXABAN 5 MG PO TABS
5.0000 mg | ORAL_TABLET | Freq: Two times a day (BID) | ORAL | Status: DC
  Administered 2023-12-17 – 2023-12-19 (×5): 5 mg via ORAL
  Filled 2023-12-17 (×5): qty 1

## 2023-12-17 MED ORDER — IOHEXOL 350 MG/ML SOLN
75.0000 mL | Freq: Once | INTRAVENOUS | Status: AC | PRN
  Administered 2023-12-17: 75 mL via INTRAVENOUS

## 2023-12-17 MED ORDER — FENTANYL CITRATE PF 50 MCG/ML IJ SOSY
6.2500 ug | PREFILLED_SYRINGE | Freq: Once | INTRAMUSCULAR | Status: AC
  Administered 2023-12-17: 6.5 ug via INTRAVENOUS
  Filled 2023-12-17: qty 1

## 2023-12-17 MED ORDER — STROKE: EARLY STAGES OF RECOVERY BOOK
Freq: Once | Status: AC
  Filled 2023-12-17: qty 1

## 2023-12-17 NOTE — Consult Note (Addendum)
 NEUROLOGY CONSULT NOTE   Date of service: December 17, 2023 Patient Name: CHRISTINE MORTON MRN:  [id number] DOB:  07/30/1941 Chief Complaint: "Syncopal episode, dizzy diarrhea" Requesting Provider: Haydee Lipa, MD  History of Present Illness  BUNNY KLEIST is a 83 y.o. female with hx of anxiety, DVT/PE on Eliquis at 2.5 mg, GERD, hypertension, hyperlipidemia, migraines, history of endometrial cancer presents to [facility] ED for evaluation of slurred speech and facial droop, as well as a syncopal episode with diarrhea. 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  Neurology consulted  Per patient she tells me yesterday she was having lots of diarrhea and while on the commode she passed out losing consciousness and hitting her chest on the tub.  She denies any weakness, numbness or tingling, facial droop, or vision changes  LKW: Sunday Modified rankin score: 1-No significant post stroke disability and can perform usual duties with stroke symptoms IV Thrombolysis:  No outside window EVT:  No LVO  NIHSS components Score: Comment  1a Level of Conscious 0[x] 1[] 2[] 3[]     1b LOC Questions 0[x] 1[] 2[]      1c LOC Commands 0[x] 1[] 2[]      2 Best Gaze 0[x] 1[] 2[]      3 Visual 0[x] 1[] 2[] 3[]     4 Facial Palsy 0[] 1[x] 2[] 3[]     5a Motor Arm - left 0[x] 1[] 2[] 3[] 4[] UN[]   5b Motor Arm - Right 0[x] 1[] 2[] 3[] 4[] UN[]   6a Motor Leg - Left 0[x] 1[] 2[] 3[] 4[] UN[]   6b Motor Leg - Right 0[x] 1[] 2[] 3[] 4[] UN[]   7 Limb Ataxia 0[x] 1[] 2[] 3[] UN[]    8 Sensory 0[x] 1[] 2[] UN[]     9 Best Language 0[x] 1[] 2[] 3[]     10 Dysarthria 0[] 1[x] 2[] UN[]     11  Extinct. and Inattention 0[x]  1[]  2[]       TOTAL: 2      ROS  Comprehensive ROS performed and pertinent positives documented in HPI   Past History   Past Medical History:  Diagnosis Date   Anxiety    Arthritis    DVT (deep venous thrombosis) (HCC)    Endometrial cancer (HCC) dx'd 01/2008    recurrences 08/2011; 01/2012; xrt comp 03/2012   GERD (gastroesophageal reflux disease)    Hyperlipidemia    Hypertension    Migraines    Radiation 10/2011   External Beam/Intracavitary   S/P radiation therapy 01/29/12 - 03/10/12   Periaortic area/ 5600 cgy/ 28 Fractions    Past Surgical History:  Procedure Laterality Date   ANTERIOR LATERAL LUMBAR FUSION 4 LEVELS Left 05/03/2016   Procedure: Lumbar three Corpectomy  with Lumbar one to lumbar five fixation fusion with percutaneous pedicle screws: left hemilaminectomy open reduction fracture fragment;  Surgeon: Raelene Bullocks Ditty, MD;  Location: [facility];  Service: Neurosurgery;  Laterality: Left;   CHOLECYSTECTOMY     EYE SURGERY     LUMBAR PERCUTANEOUS PEDICLE SCREW 4 LEVEL Bilateral 05/03/2016   Procedure: LUMBAR PERCUTANEOUS PEDICLE SCREW 4 LEVEL;  Surgeon: Raelene Bullocks Ditty, MD;  Location: [facility];  Service: Neurosurgery;  Laterality: Bilateral;   TOTAL ABDOMINAL HYSTERECTOMY W/ BILATERAL SALPINGOOPHORECTOMY  2009    Family History: Family History  Problem Relation Age of Onset   Heart failure Mother    Ulcers Father     Social History  reports that she has never smoked. She has never used smokeless tobacco. She reports that she does not drink alcohol and does not use drugs.  No Known Allergies  Medications   Current Facility-Administered Medications:    0.9 %  sodium chloride infusion, , Intravenous, Continuous, Synetta Fail, MD, Last Rate: 75 mL/hr at 12/17/23 1200, Infusion Verify at 12/17/23 1200   acetaminophen (TYLENOL) tablet 650 mg, 650 mg, Oral, Q6H PRN, 650 mg at 12/17/23 0612 **OR** acetaminophen (TYLENOL) suppository 650 mg, 650 mg, Rectal, Q6H PRN, Synetta Fail, MD   apixaban Everlene Balls) tablet 5 mg, 5 mg, Oral, BID, Azucena Fallen, MD, 5 mg at 12/17/23 0919   loperamide (IMODIUM) capsule 2 mg, 2 mg, Oral, PRN, Synetta Fail, MD   polyethylene glycol (MIRALAX / GLYCOLAX) packet  17 g, 17 g, Oral, Daily PRN, Synetta Fail, MD   rosuvastatin (CRESTOR) tablet 20 mg, 20 mg, Oral, Daily, Synetta Fail, MD, 20 mg at 12/17/23 0919   sodium chloride flush (NS) 0.9 % injection 3 mL, 3 mL, Intravenous, Q12H, Synetta Fail, MD, 3 mL at 12/17/23 0916  Vitals   Vitals:   12/17/23 0416 12/17/23 0455 12/17/23 0820 12/17/23 1153  BP:  (!) 152/95 (!) 126/53 (!) 127/53  Pulse:  66 63 65  Resp:  18 20 18   Temp:  (!) 97.4 F (36.3 C) 98.1 F (36.7 C) 98.5 F (36.9 C)  TempSrc:  Oral Oral Oral  SpO2:  96% 95% 96%  Weight: 73.5 kg     Height: 5\' 4"  (1.626 m)       Body mass index is 27.81 kg/m.  Physical Exam   Constitutional: Appears well-developed and well-nourished.  Psych: Affect appropriate to situation.  Eyes: No scleral injection.  HENT: No OP obstruction.  Head: Normocephalic.  Cardiovascular: Normal rate and regular rhythm.  Respiratory: Effort normal, non-labored breathing.  GI: Soft.  No distension. There is no tenderness.  Skin: WDI.   Neurologic Examination   Mental Status -  Level of arousal and orientation to time, place, and person were intact. Language including expression, naming, repetition, comprehension was assessed and found intact.  Mild dysarthria Attention span and concentration were normal. Recent and remote memory were intact. Fund of Knowledge was assessed and was intact.  Cranial Nerves II - XII - II - Visual field intact OU. III, IV, VI - Extraocular movements intact. V - Facial sensation intact bilaterally. VII  left facial droop VIII - Hearing & vestibular intact bilaterally. X - Palate elevates symmetrically. XI - Chin turning & shoulder shrug intact bilaterally. XII - Tongue protrusion intact.  Motor Strength - The patient's strength was normal in all extremities and pronator drift was absent.  Bulk was normal and fasciculations were absent.   Motor Tone - Muscle tone was assessed at the neck and appendages  and was normal. Sensory - Light touch, temperature/pinprick were assessed and were symmetrical.   Coordination - The patient had normal movements in the hands and feet with no ataxia or dysmetria.  Tremor was absent. Gait and Station - deferred.  Labs/Imaging/Neurodiagnostic studies   CBC:  Recent Labs  Lab 2024-01-09 1317 Jan 09, 2024 1322 12/17/23 0519  WBC 18.9*  --  15.6*  NEUTROABS 16.8*  --   --   HGB 13.2 13.9 12.7  HCT 40.0 41.0 37.0  MCV 98.0  --  93.7  PLT 187  --  176   Basic Metabolic Panel:  Lab Results  Component  Value Date   NA 139 12/17/2023   K 3.0 (L) 12/17/2023   CO2 29 12/17/2023   GLUCOSE 128 (H) 12/17/2023   BUN 11 12/17/2023   CREATININE 0.95 12/17/2023   CALCIUM 8.4 (L) 12/17/2023   GFRNONAA 60 (L) 12/17/2023   GFRAA >60 05/04/2016   Lipid Panel: No results found for: "LDLCALC" HgbA1c: No results found for: "HGBA1C" Urine Drug Screen: No results found for: "LABOPIA", "COCAINSCRNUR", "LABBENZ", "AMPHETMU", "THCU", "LABBARB"  Alcohol Level     Component Value Date/Time   ETH <10 12/16/2023 1317   INR  Lab Results  Component Value Date   INR 1.0 12/16/2023   APTT  Lab Results  Component Value Date   APTT 26 12/16/2023   AED levels: No results found for: "PHENYTOIN", "ZONISAMIDE", "LAMOTRIGINE", "LEVETIRACETA"  CT Head without contrast(Personally reviewed): No acute process. Mild chronic microvascular ischemic changes and mild parenchymal volume loss. 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  CT angio Head and Neck with contrast(Personally reviewed): Ordered  MRI Brain(Personally reviewed): Punctate acute bilateral parietal infarcts, mild chronic small vessel ischemic changes and cerebral atrophy   ASSESSMENT   NAYOMI TABRON is a 82 y.o. 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  RECOMMENDATIONS  - HgbA1c, fasting lipid panel - Frequent neuro checks - Echocardiogram - CTA head and neck - Continue Eliquis.  Will increase Eliquis to 5 mg twice daily - Risk factor modification - Telemetry monitoring - PT consult, OT consult, Speech consult - Stroke team to follow ______________________________________________________________________    Signed, Mathews Argyle, NP [facility]  ATTENDING NOTE: I reviewed above note and agree with the assessment and plan. Pt was seen and examined.   83 year old female with history of hypertension, hyperlipidemia, uterine cancer, DVT 1997, recurrent DVT and PE 2017 on Eliquis 2.5 twice daily presented for diarrhea and generalized weakness with dizziness and syncope.  On waking up, patient was found to have slurred speech and a mild left facial droop.  Patient also complained of chest pain due to trauma on the chest during fall with syncope.  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  MRI showed 3 punctate watershed infarcts at bilateral MCA/PCA and right MCA/ACA.  CT head and neck pending.  2D echo pending, LDL and A1c pending.  UA negative.  Leukocytosis WBC 18.9--15.6.  On exam, patient sister at bedside.  Patient sitting in chair, denies any facial droop and stated slurred speech much better and she felt back to baseline.  Etiology patient stroke could be due to diarrhea and dehydration induced brain perfusion reduction.  On IV fluid, recommend to avoid low BP.  Educated patient on hydration.  Patient diarrhea has resolved.  Discussed with pharmacy, increase Eliquis to 5 mg twice daily regular dosing.  Continue statin.  PT OT recommend outpatient.  For detailed assessment and plan, please refer to above as I have made changes wherever appropriate.   Marvel Plan, MD PhD Stroke  Neurology 12/17/2023 7:03 PM

## 2023-12-17 NOTE — Progress Notes (Addendum)
 PROGRESS NOTE    [name]  DGU:[id number] DOB: 1941/05/19 DOA: 12/16/2023 PCP: Windell Hasty, DO   Brief Narrative:  [name] is a 83 y.o. female with medical history significant of hypertension, hyperlipidemia, GERD, uterine cancer, PE, DVT presenting with focal neurologic deficits.    Assessment & Plan:   Principal Problem:   Focal neurological deficit Active Problems:   Malignant neoplasm of corpus uteri, except isthmus (HCC)   Right leg DVT (HCC)   Hx of pulmonary embolus   Essential hypertension   Mixed hyperlipidemia   Gastroesophageal reflux disease without esophagitis   Acute CVA, acute punctate bilateral parietal cortical infarcts, POA Dysphagia -Facial droop and slurred speech at intake, improving - Image confirms acute punctate infarcts, neurology consulted -PT OT speech per protocol - Already on full dose anticoagulation as below with Eliquis   Diarrhea, resolved Leukocytosis, reactive -Unspecified, resolved -No indication for antibiotics at this time -If recurrent loose stool will send for sample testing -White count downtrending appropriately, likely reactive  Syncope, likely vagal -Noted while using the restroom - Orthostatic vital signs pending - Echocardiogram pending   Hypokalemia  - Secondary to diarrhea, repleted, follow repeat labs  Hypertension, currently hypotensive - Holding propranolol in the setting of bradycardia and permissive hypertension - Holding Lasix   Hyperlipidemia - Continue rosuvastatin   History of PE and DVT - Continue Eliquis   History of uterine cancer - Noted   DVT prophylaxis: apixaban (ELIQUIS) tablet 2.5 mg Start: 12/16/23 2200 apixaban (ELIQUIS) tablet 2.5 mg  Code Status:   Code Status: Full Code Family Communication: None present  Status is: Inpatient  Dispo: The patient is from: Home              Anticipated d/c is to: To be determined              Anticipated d/c date is: 24 to 48  hours              Patient currently not medically stable for discharge  Consultants:  Neurology  Procedures:  None  Antimicrobials:  None  Subjective: No acute issues or events overnight denies nausea vomiting diarrhea constipation any fever chills or chest pain  Objective: Vitals:   12/17/23 0300 12/17/23 0400 12/17/23 0416 12/17/23 0455  BP: (!) 127/55 (!) 153/79  (!) 152/95  Pulse: 71 69  66  Resp: (!) 21 (!) 24  18  Temp: 98.3 F (36.8 C)   (!) 97.4 F (36.3 C)  TempSrc: Oral   Oral  SpO2: 98% 95%  96%  Weight:   73.5 kg   Height:   5\' 4"  (1.626 m)     Intake/Output Summary (Last 24 hours) at 12/17/2023 0740 Last data filed at 12/17/2023 0601 Gross per 24 hour  Intake 611.84 ml  Output --  Net 611.84 ml   Filed Weights   12/17/23 0416  Weight: 73.5 kg    Examination:  General:  Pleasantly resting in bed, No acute distress. HEENT:  Normocephalic atraumatic.  Sclerae nonicteric, noninjected.  Extraocular movements intact bilaterally. Neck:  Without mass or deformity.  Trachea is midline. Lungs:  Clear to auscultate bilaterally without rhonchi, wheeze, or rales. Heart:  Regular rate and rhythm.  Without murmurs, rubs, or gallops. Abdomen:  Soft, nontender, nondistended.  Without guarding or rebound. Extremities: Without cyanosis, clubbing, edema, or obvious deformity. Skin:  Warm and dry, no erythema.  Data Reviewed: I have personally reviewed following labs and imaging studies  CBC:  Recent Labs  Lab 12/16/23 1317 12/16/23 1322 12/17/23 0519  WBC 18.9*  --  15.6*  NEUTROABS 16.8*  --   --   HGB 13.2 13.9 12.7  HCT 40.0 41.0 37.0  MCV 98.0  --  93.7  PLT 187  --  176   Basic Metabolic Panel: Recent Labs  Lab 12/16/23 1317 12/16/23 1322 12/17/23 0519  NA 140 137 139  K 3.7 3.6 3.0*  CL 104 102 98  CO2 24  --  29  GLUCOSE 175* 173* 128*  BUN 14 14 11   CREATININE 0.84 0.80 0.95  CALCIUM 8.7*  --  8.4*   GFR: Estimated Creatinine  Clearance: 44.8 mL/min (by C-G formula based on SCr of 0.95 mg/dL). Liver Function Tests: Recent Labs  Lab 12/16/23 1317 12/17/23 0519  AST 32 32  ALT 21 19  ALKPHOS 18* 20*  BILITOT 0.7 1.2  PROT 6.4* 6.2*  ALBUMIN 3.2* 3.0*    Coagulation Profile: Recent Labs  Lab 12/16/23 1317  INR 1.0   CBG: Recent Labs  Lab 12/16/23 1315  GLUCAP 169*   No results found for this or any previous visit (from the past 240 hours).       Radiology Studies: MR BRAIN WO CONTRAST Result Date: 12/16/2023 CLINICAL DATA:  Neuro deficit, acute, stroke suspected. EXAM: MRI HEAD WITHOUT CONTRAST TECHNIQUE: Multiplanar, multiecho pulse sequences of the brain and surrounding structures were obtained without intravenous contrast. COMPARISON:  Head CT 12/16/2023 FINDINGS: Brain: There are single punctate acute cortical infarcts in both parietal lobes. No intracranial hemorrhage, mass, midline shift, or extra-axial fluid collection is identified. T2 hyperintensities in the cerebral white matter bilaterally are nonspecific but compatible with mild chronic small vessel ischemic disease. There is mild-to-moderate cerebral atrophy. Vascular: Major intracranial vascular flow voids are preserved. Skull and upper cervical spine: Unremarkable bone marrow signal. Sinuses/Orbits: Bilateral cataract extraction. Mild mucosal thickening in the right maxillary sinus. No significant mastoid fluid. Other: None. IMPRESSION: 1. Punctate acute bilateral parietal cortical infarcts. 2. Mild chronic small vessel ischemic disease and cerebral atrophy. Electronically Signed   By: Sebastian Ache M.D.   On: 12/16/2023 21:07   DG Chest 2 View Result Date: 12/16/2023 CLINICAL DATA:  Chest pain. EXAM: CHEST - 2 VIEW COMPARISON:  06/27/2017. FINDINGS: Low lung volume. Bilateral lung fields are clear. No acute consolidation or lung collapse. There is mild prominence of interstitial markings, which is nonspecific. No frank pulmonary edema.  Bilateral costophrenic angles are clear. Normal cardio-mediastinal silhouette. No acute osseous abnormalities. The soft tissues are within normal limits. IMPRESSION: No active cardiopulmonary disease. Electronically Signed   By: Jules Schick M.D.   On: 12/16/2023 17:03   CT HEAD WO CONTRAST Result Date: 12/16/2023 CLINICAL DATA:  Neuro deficit, concern for stroke. Dizziness, slurred speech, facial asymmetry. EXAM: CT HEAD WITHOUT CONTRAST TECHNIQUE: Contiguous axial images were obtained from the base of the skull through the vertex without intravenous contrast. RADIATION DOSE REDUCTION: This exam was performed according to the departmental dose-optimization program which includes automated exposure control, adjustment of the mA and/or kV according to patient size and/or use of iterative reconstruction technique. COMPARISON:  None Available. FINDINGS: Brain: No acute intracranial hemorrhage. No CT evidence of acute infarct. Nonspecific hypoattenuation in the periventricular and subcortical white matter favored to reflect chronic microvascular ischemic changes. 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 No edema, mass effect, or midline shift. The basilar cisterns are patent. Ventricles: Prominence of  the ventricles suggesting underlying parenchymal volume loss. Vascular: Atherosclerotic calcifications of the carotid siphons. No hyperdense vessel. Skull: No acute or aggressive finding. Orbits: Orbits are symmetric. Sinuses: Mucosal thickening in the right maxillary sinus with findings suggestive of mucoperiosteal reaction. 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 Other: Mastoid air cells are clear. IMPRESSION: No CT evidence of acute intracranial abnormality. Mild chronic microvascular ischemic changes and mild parenchymal volume loss. 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 Electronically Signed   By: Denny Flack M.D.   On: 12/16/2023 15:54   Scheduled Meds:  apixaban  2.5 mg Oral BID   rosuvastatin  20 mg Oral Daily   sodium chloride flush  3 mL Intravenous Q12H   Continuous Infusions:  sodium chloride 75 mL/hr at 12/17/23 1610     LOS: 0 days   Time spent:  Haydee Lipa, DO [facility]  If 7PM-7AM, please contact night-coverage [email or web address]  12/17/2023, 7:40 AM

## 2023-12-17 NOTE — Care Management Obs Status (Signed)
 MEDICARE OBSERVATION STATUS NOTIFICATION   Patient Details  Name: [name] MRN: [id number] Date of Birth: 1941/01/25   Medicare Observation Status Notification Given:  Yes MOON/OBS letter signed and given   Wynonia Hedges 12/17/2023, 10:56 AM

## 2023-12-17 NOTE — Progress Notes (Signed)
 Patient arrived in the unit accompanied by ED nursing tech, transferred to bed.

## 2023-12-17 NOTE — Plan of Care (Signed)
  Problem: Education: Goal: Knowledge of General Education information will improve Description: Including pain rating scale, medication(s)/side effects and non-pharmacologic comfort measures Outcome: Progressing   Problem: Education: Goal: Knowledge of disease or condition will improve Outcome: Progressing Goal: Knowledge of secondary prevention will improve (MUST DOCUMENT ALL) Outcome: Progressing Goal: Knowledge of patient specific risk factors will improve (DELETE if not current risk factor) Outcome: Progressing   Problem: Ischemic Stroke/TIA Tissue Perfusion: Goal: Complications of ischemic stroke/TIA will be minimized Outcome: Progressing   Problem: Coping: Goal: Will verbalize positive feelings about self Outcome: Progressing Goal: Will identify appropriate support needs Outcome: Progressing   Problem: Health Behavior/Discharge Planning: Goal: Ability to manage health-related needs will improve Outcome: Progressing Goal: Goals will be collaboratively established with patient/family Outcome: Progressing   Problem: Self-Care: Goal: Ability to participate in self-care as condition permits will improve Outcome: Progressing Goal: Verbalization of feelings and concerns over difficulty with self-care will improve Outcome: Progressing Goal: Ability to communicate needs accurately will improve Outcome: Progressing   Problem: Nutrition: Goal: Risk of aspiration will decrease Outcome: Progressing Goal: Dietary intake will improve Outcome: Progressing

## 2023-12-17 NOTE — Plan of Care (Signed)
 Problem: Education: Goal: Knowledge of General Education information will improve Description: Including pain rating scale, medication(s)/side effects and non-pharmacologic comfort measures 12/17/2023 1545 by Paulla Bossier, RN Outcome: Progressing 12/17/2023 1544 by Paulla Bossier, RN Outcome: Progressing   Problem: Health Behavior/Discharge Planning: Goal: Ability to manage health-related needs will improve 12/17/2023 1545 by Paulla Bossier, RN Outcome: Progressing 12/17/2023 1544 by Paulla Bossier, RN Outcome: Progressing   Problem: Clinical Measurements: Goal: Ability to maintain clinical measurements within normal limits will improve 12/17/2023 1545 by Paulla Bossier, RN Outcome: Progressing 12/17/2023 1544 by Paulla Bossier, RN Outcome: Progressing Goal: Will remain free from infection 12/17/2023 1545 by Paulla Bossier, RN Outcome: Progressing 12/17/2023 1544 by Paulla Bossier, RN Outcome: Progressing Goal: Diagnostic test results will improve 12/17/2023 1545 by Paulla Bossier, RN Outcome: Progressing 12/17/2023 1544 by Paulla Bossier, RN Outcome: Progressing Goal: Respiratory complications will improve 12/17/2023 1545 by Paulla Bossier, RN Outcome: Progressing 12/17/2023 1544 by Paulla Bossier, RN Outcome: Progressing Goal: Cardiovascular complication will be avoided 12/17/2023 1545 by Paulla Bossier, RN Outcome: Progressing 12/17/2023 1544 by Paulla Bossier, RN Outcome: Progressing   Problem: Activity: Goal: Risk for activity intolerance will decrease 12/17/2023 1545 by Paulla Bossier, RN Outcome: Progressing 12/17/2023 1544 by Paulla Bossier, RN Outcome: Progressing   Problem: Coping: Goal: Level of anxiety will decrease 12/17/2023 1545 by Paulla Bossier, RN Outcome: Progressing 12/17/2023 1544 by Paulla Bossier, RN Outcome: Progressing   Problem: Nutrition: Goal: Adequate nutrition will be maintained 12/17/2023 1545 by Paulla Bossier, RN Outcome:  Progressing 12/17/2023 1544 by Paulla Bossier, RN Outcome: Progressing   Problem: Elimination: Goal: Will not experience complications related to bowel motility 12/17/2023 1545 by Paulla Bossier, RN Outcome: Progressing 12/17/2023 1544 by Paulla Bossier, RN Outcome: Progressing Goal: Will not experience complications related to urinary retention 12/17/2023 1545 by Paulla Bossier, RN Outcome: Progressing 12/17/2023 1544 by Paulla Bossier, RN Outcome: Progressing   Problem: Pain Managment: Goal: General experience of comfort will improve and/or be controlled 12/17/2023 1545 by Paulla Bossier, RN Outcome: Progressing 12/17/2023 1544 by Paulla Bossier, RN Outcome: Progressing   Problem: Safety: Goal: Ability to remain free from injury will improve 12/17/2023 1545 by Paulla Bossier, RN Outcome: Progressing 12/17/2023 1544 by Paulla Bossier, RN Outcome: Progressing   Problem: Skin Integrity: Goal: Risk for impaired skin integrity will decrease 12/17/2023 1545 by Paulla Bossier, RN Outcome: Progressing 12/17/2023 1544 by Paulla Bossier, RN Outcome: Progressing   Problem: Education: Goal: Knowledge of disease or condition will improve Outcome: Progressing Goal: Knowledge of secondary prevention will improve (MUST DOCUMENT ALL) Outcome: Progressing Goal: Knowledge of patient specific risk factors will improve (DELETE if not current risk factor) Outcome: Progressing   Problem: Ischemic Stroke/TIA Tissue Perfusion: Goal: Complications of ischemic stroke/TIA will be minimized Outcome: Progressing   Problem: Coping: Goal: Will verbalize positive feelings about self Outcome: Progressing Goal: Will identify appropriate support needs Outcome: Progressing   Problem: Health Behavior/Discharge Planning: Goal: Ability to manage health-related needs will improve Outcome: Progressing Goal: Goals will be collaboratively established with patient/family Outcome: Progressing    Problem: Self-Care: Goal: Ability to participate in self-care as condition permits will improve Outcome: Progressing Goal: Verbalization of feelings and concerns over difficulty with self-care will improve Outcome: Progressing Goal: Ability to communicate needs accurately will improve Outcome: Progressing   Problem: Nutrition: Goal: Risk of aspiration will decrease Outcome: Progressing  Goal: Dietary intake will improve Outcome: Progressing

## 2023-12-17 NOTE — TOC Initial Note (Signed)
 Transition of Care [facility]) - Initial/Assessment Note    Patient Details  Name: [name] MRN: [id number] Date of Birth: Dec 09, 1940  Transition of Care [facility]) CM/SW Contact:    Kermit Balo, RN Phone Number: 12/17/2023, 3:27 PM  Clinical Narrative:                  Pt is from home alone. She says she may have some friends that can check  on her.  She was driving self. Pt can call insurance company to see about scheduled rides as needed. She manages her own medications and does admit to some noncompliance with pm meds. CM discussed with her setting an alarm so she would remember.  Outpt therapy recommended. Will see if home health will be better option depending on transportation.  TOC following.   Expected Discharge Plan: Home w Home Health Services     Patient Goals and CMS Choice            Expected Discharge Plan and Services   Discharge Planning Services: CM Consult   Living arrangements for the past 2 months: Single Family Home                                      Prior Living Arrangements/Services Living arrangements for the past 2 months: Single Family Home Lives with:: Self Patient language and need for interpreter reviewed:: Yes Do you feel safe going back to the place where you live?: Yes        Care giver support system in place?: No (comment) Current home services: DME (shower seat/ walker/ [facility]) Criminal Activity/Legal Involvement Pertinent to Current Situation/Hospitalization: No - Comment as needed  Activities of Daily Living   ADL Screening (condition at time of admission) Independently performs ADLs?: Yes (appropriate for developmental age) Is the patient deaf or have difficulty hearing?: No Does the patient have difficulty seeing, even when wearing glasses/contacts?: No Does the patient have difficulty concentrating, remembering, or making decisions?: No  Permission Sought/Granted                  Emotional  Assessment Appearance:: Appears stated age Attitude/Demeanor/Rapport: Engaged Affect (typically observed): Accepting Orientation: : Oriented to Self, Oriented to Place, Oriented to  Time, Oriented to Situation   Psych Involvement: No (comment)  Admission diagnosis:  Chest wall pain [R07.89] Chronic anticoagulation [Z79.01] Facial droop [R29.810] Focal neurological deficit [R29.818] Fall, initial encounter [W19.XXXA] Patient Active Problem List   Diagnosis Date Noted   Focal neurological deficit 12/16/2023   Dyspnea on exertion 11/16/2022   Acute on chronic heart failure with preserved ejection fraction (HCC) 08/17/2022   Vaginal atrophy 02/18/2020   Abnormal pelvic exam 02/18/2020   Pelvic relaxation due to vaginal prolapse 02/18/2020   Lower abdominal pain 02/18/2020   Essential hypertension 09/25/2019   Mixed hyperlipidemia 09/25/2019   Sinus bradycardia 09/25/2019   Laboratory examination 11/17/2018   Gastroesophageal reflux disease without esophagitis 10/30/2016   Right leg DVT (HCC) 08/07/2016   Hx of pulmonary embolus 08/07/2016   Burst fracture of lumbar vertebra (HCC) 05/03/2016   S/P lumbar fusion 05/03/2016   Endometrial cancer (HCC)    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 [place] [address] Phone: [phone] Fax: [phone]  [facility] - [facility] [address] [place] [address] Phone: [phone] Fax: [phone]  [facility] - [place], [place] - [address] [place] [address] Phone: [phone] Fax: [phone]     Social Drivers of Health (SDOH) Social History: SDOH Screenings   Food Insecurity: No Food Insecurity (12/17/2023)  Housing: Low Risk  (12/17/2023)  Transportation Needs: No Transportation Needs (12/17/2023)  Utilities: Not At Risk (12/17/2023)  Social Connections: Moderately Integrated (12/17/2023)  Tobacco Use: Low Risk  (12/16/2023)   SDOH Interventions:     Readmission Risk Interventions     No data to display

## 2023-12-17 NOTE — Evaluation (Signed)
 Physical Therapy Evaluation Patient Details Name: MILAROSE SAVICH MRN: [id number] DOB: 01/23/1941 Today's Date: 12/17/2023  History of Present Illness  83 y.o. female presents to [facility] hospital on 12/16/2023 after a syncopal episode and fall. After the fall pt was noted to have slurred speech and facial droop. MRI notable for bilateral parietal infarcts. PMH includes HTN, HLD, GERD, uterine CA, PE, DVT.  Clinical Impression  Pt presents to PT with deficits in strength, power, activity tolerance, functional mobility and gait. Pt reports pain at chest with activity, no noticeable ecchymosis. Pt also reports pain at R ankle with weightbearing, PT is unable to reproduce the pain with palpation and no pain is noted with AROM in open chain. Pt is able to transfer without physical assistance and tolerates ambulation for a short distance with support of IV pole. PT is hopeful that with further opportunity to mobilize and with some pain management for chest discomfort that the pt will see quick improvement in her mobility tolerance and quality. PT will follow up to progress activity tolerance and balance. Outpatient PT recommended at the time of discharge.      If plan is discharge home, recommend the following: A little help with bathing/dressing/bathroom;A little help with walking and/or transfers;Assistance with cooking/housework;Assist for transportation;Help with stairs or ramp for entrance   Can travel by private vehicle        Equipment Recommendations None recommended by PT  Recommendations for Other Services       Functional Status Assessment Patient has had a recent decline in their functional status and demonstrates the ability to make significant improvements in function in a reasonable and predictable amount of time.     Precautions / Restrictions Precautions Precautions: Fall Recall of Precautions/Restrictions: Intact Restrictions Weight Bearing Restrictions Per Provider Order: No       Mobility  Bed Mobility               General bed mobility comments: not assessed, pt received and left seated in recliner    Transfers Overall transfer level: Needs assistance Equipment used: None Transfers: Sit to/from Stand Sit to Stand: Contact guard assist           General transfer comment: increased time, pain at chest when pushing through UE    Ambulation/Gait Ambulation/Gait assistance: Contact guard assist Gait Distance (Feet): 80 Feet Assistive device: IV Pole Gait Pattern/deviations: Step-to pattern Gait velocity: reduced Gait velocity interpretation: <1.8 ft/sec, indicate of risk for recurrent falls   General Gait Details: slowed step-to gait, increased time to turn, reduced stride length  Stairs            Wheelchair Mobility     Tilt Bed    Modified Rankin (Stroke Patients Only) Modified Rankin (Stroke Patients Only) Pre-Morbid Rankin Score: No symptoms Modified Rankin: Moderately severe disability     Balance Overall balance assessment: Needs assistance Sitting-balance support: No upper extremity supported, Feet supported Sitting balance-Leahy Scale: Good     Standing balance support: Single extremity supported, Reliant on assistive device for balance Standing balance-Leahy Scale: Poor                               Pertinent Vitals/Pain Pain Assessment Pain Assessment: Faces Faces Pain Scale: Hurts whole lot Pain Location: chest Pain Descriptors / Indicators: Sore Pain Intervention(s): Monitored during session    Home Living Family/patient expects to be discharged to:: Private residence Living Arrangements: Alone Available Help  at Discharge: Friend(s);Available PRN/intermittently Type of Home: Mobile home Home Access: Stairs to enter Entrance Stairs-Rails: Can reach both Entrance Stairs-Number of Steps: 4   Home Layout: One level Home Equipment: Agricultural consultant (2 wheels);Toilet riser      Prior  Function Prior Level of Function : Independent/Modified Independent;Driving             Mobility Comments: ambulatory without DME       Extremity/Trunk Assessment   Upper Extremity Assessment Upper Extremity Assessment: Generalized weakness (grossly 4/5, pain at chest limiting strength)    Lower Extremity Assessment Lower Extremity Assessment: Generalized weakness (grossly 4+/5)    Cervical / Trunk Assessment Cervical / Trunk Assessment: Normal  Communication   Communication Communication: No apparent difficulties    Cognition Arousal: Alert Behavior During Therapy: WFL for tasks assessed/performed   PT - Cognitive impairments: No apparent impairments                         Following commands: Intact       Cueing Cueing Techniques: Verbal cues     General Comments General comments (skin integrity, edema, etc.): VSS on RA, BP sitting 138/65, standing 132/70, s/p ambulation 147/71    Exercises     Assessment/Plan    PT Assessment Patient needs continued PT services  PT Problem List Decreased strength;Decreased activity tolerance;Decreased mobility;Decreased balance;Decreased knowledge of use of DME;Decreased knowledge of precautions;Decreased safety awareness;Pain       PT Treatment Interventions Gait training;DME instruction;Stair training;Functional mobility training;Therapeutic activities;Therapeutic exercise;Balance training;Neuromuscular re-education;Patient/family education    PT Goals (Current goals can be found in the Care Plan section)  Acute Rehab PT Goals Patient Stated Goal: to reduce pain at chest when mobilizing PT Goal Formulation: With patient Time For Goal Achievement: 12/31/23 Potential to Achieve Goals: Good    Frequency Min 3X/week     Co-evaluation               AM-PAC PT "6 Clicks" Mobility  Outcome Measure Help needed turning from your back to your side while in a flat bed without using bedrails?: A  Little Help needed moving from lying on your back to sitting on the side of a flat bed without using bedrails?: A Little Help needed moving to and from a bed to a chair (including a wheelchair)?: A Little Help needed standing up from a chair using your arms (e.g., wheelchair or bedside chair)?: A Little Help needed to walk in hospital room?: A Little Help needed climbing 3-5 steps with a railing? : A Little 6 Click Score: 18    End of Session   Activity Tolerance: Patient limited by pain Patient left: in chair;with call bell/phone within reach;with family/visitor present Nurse Communication: Mobility status PT Visit Diagnosis: Other abnormalities of gait and mobility (R26.89);Muscle weakness (generalized) (M62.81);Pain Pain - part of body:  (ribs)    Time: 1610-9604 PT Time Calculation (min) (ACUTE ONLY): 27 min   Charges:   PT Evaluation $PT Eval Low Complexity: 1 Low   PT General Charges $$ ACUTE PT VISIT: 1 Visit         Rexie Catena, PT, DPT Acute Rehabilitation Office [phone]   Rexie Catena 12/17/2023, 1:44 PM

## 2023-12-17 NOTE — Progress Notes (Signed)
 Orthostatic blood pressures  150/55 lying 139/65 sitting  115/70 standing

## 2023-12-17 NOTE — Progress Notes (Signed)
 0800 patient alert x4 able to make all needs known on room air. Patient able to get up to bedside commode.

## 2023-12-17 NOTE — Progress Notes (Signed)
  Echocardiogram 2D Echocardiogram has been performed.  [name] 12/17/2023, 5:50 PM

## 2023-12-17 NOTE — Progress Notes (Addendum)
2D echo attempted, patient not in room. Will try later 

## 2023-12-18 DIAGNOSIS — Z7901 Long term (current) use of anticoagulants: Secondary | ICD-10-CM

## 2023-12-18 DIAGNOSIS — I6340 Cerebral infarction due to embolism of unspecified cerebral artery: Secondary | ICD-10-CM

## 2023-12-18 LAB — CBC
HCT: 37.8 % (ref 36.0–46.0)
Hemoglobin: 12.6 g/dL (ref 12.0–15.0)
MCH: 32.1 pg (ref 26.0–34.0)
MCHC: 33.3 g/dL (ref 30.0–36.0)
MCV: 96.2 fL (ref 80.0–100.0)
Platelets: 151 10*3/uL (ref 150–400)
RBC: 3.93 MIL/uL (ref 3.87–5.11)
RDW: 14 % (ref 11.5–15.5)
WBC: 14.6 10*3/uL — ABNORMAL HIGH (ref 4.0–10.5)
nRBC: 0 % (ref 0.0–0.2)

## 2023-12-18 LAB — HEMOGLOBIN A1C
Hgb A1c MFr Bld: 5.7 % — ABNORMAL HIGH (ref 4.8–5.6)
Mean Plasma Glucose: 116.89 mg/dL

## 2023-12-18 LAB — BASIC METABOLIC PANEL WITH GFR
Anion gap: 10 (ref 5–15)
BUN: 10 mg/dL (ref 8–23)
CO2: 24 mmol/L (ref 22–32)
Calcium: 8.5 mg/dL — ABNORMAL LOW (ref 8.9–10.3)
Chloride: 103 mmol/L (ref 98–111)
Creatinine, Ser: 0.57 mg/dL (ref 0.44–1.00)
GFR, Estimated: >60 mL/min (ref >60)
Glucose, Bld: 115 mg/dL — ABNORMAL HIGH (ref 70–99)
Potassium: 4 mmol/L (ref 3.5–5.1)
Sodium: 137 mmol/L (ref 135–145)

## 2023-12-18 LAB — LIPID PANEL
Cholesterol: 93 mg/dL (ref 0–200)
HDL: 52 mg/dL (ref >40)
LDL Cholesterol: 28 mg/dL (ref 0–99)
Total CHOL/HDL Ratio: 1.8 ratio
Triglycerides: 64 mg/dL (ref <150)
VLDL: 13 mg/dL (ref 0–40)

## 2023-12-18 LAB — C DIFFICILE QUICK SCREEN W PCR REFLEX
C Diff antigen: NEGATIVE
C Diff interpretation: NOT DETECTED
C Diff toxin: NEGATIVE

## 2023-12-18 MED ORDER — LACTATED RINGERS IV SOLN: Freq: Once | INTRAVENOUS | Status: AC

## 2023-12-18 MED ORDER — PROCHLORPERAZINE EDISYLATE 10 MG/2ML IJ SOLN
5.0000 mg | Freq: Once | INTRAMUSCULAR | Status: AC
  Administered 2023-12-18: 5 mg via INTRAVENOUS
  Filled 2023-12-18: qty 2

## 2023-12-18 MED ORDER — LIDOCAINE 5 % EX PTCH
1.0000 | MEDICATED_PATCH | CUTANEOUS | Status: DC
Start: 2023-12-18 — End: 2023-12-27
  Administered 2023-12-18 – 2023-12-27 (×9): 1 via TRANSDERMAL
  Filled 2023-12-18 (×11): qty 1

## 2023-12-18 NOTE — Progress Notes (Signed)
 Physical Therapy Treatment Patient Details Name: [name] MRN: [id number] DOB: [birth year] Today's Date: 12/18/2023   History of Present Illness 83 y.o. female presents to [facility] hospital on 12/16/2023 after a syncopal episode and fall. After the fall pt was noted to have slurred speech and facial droop. MRI notable for bilateral parietal infarcts. PMH includes HTN, HLD, GERD, uterine CA, PE, DVT.    PT Comments  Pt is progressing towards goals. Currently pt is CGA to supervision for gait, supervision for stairs per home set up,  supervision for bed mobiltiy and CGA for transfers without an AD. Due to pt current functional status, home set up and available assistance at home recommending skilled physical therapy services 3x/week in order to address strength, balance and functional mobility to decrease risk for falls, injury and re-hospitalization.      If plan is discharge home, recommend the following: A little help with walking and/or transfers;Assistance with cooking/housework;Assist for transportation;Help with stairs or ramp for entrance     Equipment Recommendations  None recommended by PT       Precautions / Restrictions Precautions Precautions: Fall Recall of Precautions/Restrictions: Intact Precaution/Restrictions Comments: reporting chest pain from fall, stating she feels warm intermittently. Pt was hyperventilating; able to improve breathing with breathing exercises and demonstration. Restrictions Weight Bearing Restrictions Per Provider Order: No     Mobility  Bed Mobility Overal bed mobility: Needs Assistance Bed Mobility: Supine to Sit, Sit to Supine     Supine to sit: Supervision, Used rails Sit to supine: Supervision   General bed mobility comments: supervision for supine <> sitting with use of bed rail for getting to sitting and increased time    Transfers Overall transfer level: Needs assistance Equipment used: None Transfers: Sit to/from Stand, Bed to  chair/wheelchair/BSC Sit to Stand: Contact guard assist   Step pivot transfers: Contact guard assist       General transfer comment: CGA for safety with sit to stand from EOB and from toilet.    Ambulation/Gait Ambulation/Gait assistance: Contact guard assist, Supervision Gait Distance (Feet): 100 Feet Assistive device: None, 1 person hand held assist Gait Pattern/deviations: Step-through pattern, Decreased step length - left, Decreased step length - right Gait velocity: decreased Gait velocity interpretation: <1.8 ft/sec, indicate of risk for recurrent falls   General Gait Details: Initially HHA due to slight instability/pt anxiety pt continuously stating " I am going to pass out throughout session." Pt progressed; after performing stairs ambulated without HHA at supervision level no LOB, increased stride length.   Stairs Stairs: Yes Stairs assistance: Supervision Stair Management: One rail Right, Step to pattern, Forwards Number of Stairs: 4     Modified Rankin (Stroke Patients Only) Modified Rankin (Stroke Patients Only) Pre-Morbid Rankin Score: No symptoms Modified Rankin: Slight disability     Balance Overall balance assessment: Needs assistance Sitting-balance support: No upper extremity supported, Feet supported Sitting balance-Leahy Scale: Good     Standing balance support: No upper extremity supported, During functional activity Standing balance-Leahy Scale: Fair Standing balance comment: no overt LOB.        Communication Communication Communication: No apparent difficulties  Cognition Arousal: Alert Behavior During Therapy: Anxious   PT - Cognitive impairments: No apparent impairments, No family/caregiver present to determine baseline     Following commands: Intact      Cueing Cueing Techniques: Verbal cues     General Comments General comments (skin integrity, edema, etc.): Pt was reporting pain intermittently; improved with distraction asking  about hobbies,  church. Pt likes to make greeting cards. Increased when pt focusing on pain with hyperventilation. Pt improved with education on boxed breathing with demonstration and return demonstration. Initially pt very nervous about mobility but improved with movement. Pt performed stairs per home set up at supervision level and then was able to ambulate at supervision level for 50 ft without an AD. throughout session pt would state " I am going to pass out." "I hope I don't pass out." No signs/symptoms of patient passing out and pt was able to work throughout entire session.      Pertinent Vitals/Pain Pain Assessment Pain Assessment: Faces Faces Pain Scale: Hurts whole lot Pain Location: chest Pain Descriptors / Indicators: Sore Pain Intervention(s): Limited activity within patient's tolerance, Monitored during session    Home Living Family/patient expects to be discharged to:: Private residence Living Arrangements: Alone Available Help at Discharge: Friend(s);Available PRN/intermittently Type of Home: Mobile home Home Access: Stairs to enter Entrance Stairs-Rails: Can reach both Entrance Stairs-Number of Steps: 4   Home Layout: One level Home Equipment: Agricultural consultant (2 wheels);Toilet riser          PT Goals (current goals can now be found in the care plan section) Acute Rehab PT Goals Patient Stated Goal: to reduce pain at chest when mobilizing PT Goal Formulation: With patient Time For Goal Achievement: 12/31/23 Potential to Achieve Goals: Good Progress towards PT goals: Progressing toward goals    Frequency    Min 3X/week      PT Plan  Continue with current POC        AM-PAC PT "6 Clicks" Mobility   Outcome Measure  Help needed turning from your back to your side while in a flat bed without using bedrails?: A Little Help needed moving from lying on your back to sitting on the side of a flat bed without using bedrails?: A Little Help needed moving to and  from a bed to a chair (including a wheelchair)?: A Little Help needed standing up from a chair using your arms (e.g., wheelchair or bedside chair)?: A Little Help needed to walk in hospital room?: A Little Help needed climbing 3-5 steps with a railing? : A Little 6 Click Score: 18    End of Session Equipment Utilized During Treatment: Gait belt Activity Tolerance: Patient tolerated treatment well;Patient limited by pain Patient left: in bed;with call bell/phone within reach;with bed alarm set Nurse Communication: Mobility status PT Visit Diagnosis: Other abnormalities of gait and mobility (R26.89);Muscle weakness (generalized) (M62.81);Pain Pain - part of body:  (chest at sternum/rib attachments)     Time: 9528-4132 PT Time Calculation (min) (ACUTE ONLY): 25 min  Charges:    $Therapeutic Activity: 23-37 mins PT General Charges $$ ACUTE PT VISIT: 1 Visit                    Sloan Duncans, DPT, CLT  Acute Rehabilitation Services Office: [phone] (Secure chat preferred)    [name] 12/18/2023, 3:42 PM

## 2023-12-18 NOTE — Discharge Instructions (Signed)
 Information on my medicine - ELIQUIS (apixaban)  This medication education was reviewed with me or my healthcare representative as part of my discharge preparation.    Why was Eliquis prescribed for you? Eliquis was prescribed to treat blood clots that may have been found in the veins of your legs (deep vein thrombosis) or in your lungs (pulmonary embolism) and to reduce the risk of them occurring again.  What do You need to know about Eliquis ? Continue Eliquis 5 mg tablet taken TWICE daily.  Eliquis may be taken with or without food.   Try to take the dose about the same time in the morning and in the evening. If you have difficulty swallowing the tablet whole please discuss with your pharmacist how to take the medication safely.  Take Eliquis exactly as prescribed and DO NOT stop taking Eliquis without talking to the doctor who prescribed the medication.  Stopping may increase your risk of developing a new blood clot.  Refill your prescription before you run out.  After discharge, you should have regular check-up appointments with your healthcare provider that is prescribing your Eliquis.    What do you do if you miss a dose? If a dose of ELIQUIS is not taken at the scheduled time, take it as soon as possible on the same day and twice-daily administration should be resumed. The dose should not be doubled to make up for a missed dose.  Important Safety Information A possible side effect of Eliquis is bleeding. You should call your healthcare provider right away if you experience any of the following: Bleeding from an injury or your nose that does not stop. Unusual colored urine (red or dark brown) or unusual colored stools (red or black). Unusual bruising for unknown reasons. A serious fall or if you hit your head (even if there is no bleeding).  Some medicines may interact with Eliquis and might increase your risk of bleeding or clotting while on Eliquis. To help avoid this,  consult your healthcare provider or pharmacist prior to using any new prescription or non-prescription medications, including herbals, vitamins, non-steroidal anti-inflammatory drugs (NSAIDs) and supplements.  This website has more information on Eliquis (apixaban): http://[email or web address].com/eliquis/home

## 2023-12-18 NOTE — Progress Notes (Signed)
 PROGRESS NOTE    [name]  ZOX:[id number] DOB: [birth year] DOA: 12/16/2023 PCP: Charlane Ferretti, DO   Brief Narrative:  [name] is a 83 y.o. female with medical history significant of hypertension, hyperlipidemia, GERD, uterine cancer, PE, DVT presenting with focal neurologic deficits.    Assessment & Plan:   Principal Problem:   Focal neurological deficit Active Problems:   Malignant neoplasm of corpus uteri, except isthmus (HCC)   Right leg DVT (HCC)   Hx of pulmonary embolus   Essential hypertension   Mixed hyperlipidemia   Gastroesophageal reflux disease without esophagitis   Acute CVA, acute punctate bilateral parietal cortical infarcts, POA Dysphagia -Facial droop and slurred speech at intake, improving - Image confirms acute punctate infarcts, neurology consulted -PT OT speech per protocol - Already on full dose anticoagulation as below with Eliquis although on 2.5mg  BID for unclear reason - increase eliquis to 5mg  BID per discussion with neuro and pharm -Echo: EF 60-65%; grade 1 diastolic dysfunction, without obvious shunt   Diarrhea, resolved Leukocytosis, reactive -Unspecified, resolved -No indication for antibiotics at this time -If recurrent loose stool will send for sample testing -White count downtrending appropriately, likely reactive  Syncope, likely vagal Orthostatic hypotension  -Vitals today with PT OT notable for drop in blood pressure from supine to sitting as well as profound tachycardia with sitting to stand. - Restart IV fluid overnight, encourage p.o. intake and reevaluate in the next 24 hours - Echocardiogram with EF 60 to 65% normal LV function with grade 1 diastolic dysfunction, without notable shunt   Hypokalemia  - Secondary to diarrhea, within normal limits  Hypertension, currently hypotensive - Holding propranolol in the setting of bradycardia and permissive hypertension - Holding Lasix   Hyperlipidemia - Continue  rosuvastatin   History of PE and DVT - Continue Eliquis   History of uterine cancer - Noted   DVT prophylaxis:  apixaban (ELIQUIS) tablet 5 mg  Code Status:   Code Status: Full Code Family Communication: None present  Status is: Inpatient  Dispo: The patient is from: Home              Anticipated d/c is to: To be determined              Anticipated d/c date is: 24 to 48 hours              Patient currently not medically stable for discharge  Consultants:  Neurology  Procedures:  None  Antimicrobials:  None  Subjective: No acute issues or events overnight denies nausea vomiting diarrhea constipation any fever chills -Notes ongoing pleuritic chest pain today with deep inspiration or palpation of the anterior chest wall.  Objective: Vitals:   12/17/23 1749 12/17/23 1952 12/17/23 2348 12/18/23 0455  BP: 115/70 138/65 (!) 143/61 (!) 156/66  Pulse:  66 65 69  Resp:  18 18 18   Temp:  98 F (36.7 C) 97.8 F (36.6 C) 98.4 F (36.9 C)  TempSrc:  Oral Oral Oral  SpO2:  97% 96% 98%  Weight:      Height:        Intake/Output Summary (Last 24 hours) at 12/18/2023 0720 Last data filed at 12/17/2023 2031 Gross per 24 hour  Intake 1380.8 ml  Output --  Net 1380.8 ml   Filed Weights   12/17/23 0416  Weight: 73.5 kg    Examination:  General:  Pleasantly resting in bed, No acute distress. HEENT:  Normocephalic atraumatic.  Sclerae nonicteric, noninjected.  Extraocular movements intact bilaterally. Neck:  Without mass or deformity.  Trachea is midline. Lungs:  Clear to auscultate bilaterally without rhonchi, wheeze, or rales. Heart:  Regular rate and rhythm.  Without murmurs, rubs, or gallops. Abdomen:  Soft, nontender, nondistended.  Without guarding or rebound. Extremities: Without cyanosis, clubbing, edema, or obvious deformity. Skin:  Warm and dry, no erythema.  Data Reviewed: I have personally reviewed following labs and imaging studies  CBC: Recent Labs  Lab  12/16/23 1317 12/16/23 1322 12/17/23 0519  WBC 18.9*  --  15.6*  NEUTROABS 16.8*  --   --   HGB 13.2 13.9 12.7  HCT 40.0 41.0 37.0  MCV 98.0  --  93.7  PLT 187  --  176   Basic Metabolic Panel: Recent Labs  Lab 12/16/23 1317 12/16/23 1322 12/17/23 0519  NA 140 137 139  K 3.7 3.6 3.0*  CL 104 102 98  CO2 24  --  29  GLUCOSE 175* 173* 128*  BUN 14 14 11   CREATININE 0.84 0.80 0.95  CALCIUM 8.7*  --  8.4*   GFR: Estimated Creatinine Clearance: 44.8 mL/min (by C-G formula based on SCr of 0.95 mg/dL). Liver Function Tests: Recent Labs  Lab 12/16/23 1317 12/17/23 0519  AST 32 32  ALT 21 19  ALKPHOS 18* 20*  BILITOT 0.7 1.2  PROT 6.4* 6.2*  ALBUMIN 3.2* 3.0*    Coagulation Profile: Recent Labs  Lab 12/16/23 1317  INR 1.0   CBG: Recent Labs  Lab 12/16/23 1315  GLUCAP 169*   No results found for this or any previous visit (from the past 240 hours).       Radiology Studies: ECHOCARDIOGRAM COMPLETE BUBBLE STUDY Result Date: 12/17/2023    ECHOCARDIOGRAM REPORT   Patient Name:   [name] Date of Exam: 12/17/2023 Medical Rec #:  [id number]      Height:       64.0 in Accession #:    [id number]     Weight:       162.0 lb Date of Birth:  [birth year]      BSA:          1.789 m Patient Age:    83 years       BP:           140/51 mmHg Patient Gender: F              HR:           66 bpm. Exam Location:  Inpatient Procedure: 2D Echo (Both Spectral and Color Flow Doppler were utilized during            procedure). Indications:    syncope  History:        Patient has no prior history of Echocardiogram examinations.                 Risk Factors:Hypertension and Dyslipidemia.  Sonographer:    Delcie Roch RDCS Referring Phys: [id number] Cecille Po MELVIN IMPRESSIONS  1. Left ventricular ejection fraction, by estimation, is 60 to 65%. The left ventricle has normal function. The left ventricle has no regional wall motion abnormalities. Left ventricular diastolic parameters are  consistent with Grade I diastolic dysfunction (impaired relaxation).  2. Right ventricular systolic function is normal. The right ventricular size is normal. There is mildly elevated pulmonary artery systolic pressure. The estimated right ventricular systolic pressure is 37.8 mmHg.  3. The mitral valve is normal in structure. Trivial mitral valve regurgitation. No evidence  of mitral stenosis.  4. The aortic valve is normal in structure. Aortic valve regurgitation is not visualized. No aortic stenosis is present.  5. The inferior vena cava is normal in size with greater than 50% respiratory variability, suggesting right atrial pressure of 3 mmHg.  6. Agitated saline contrast bubble study was negative, with no evidence of any interatrial shunt. FINDINGS  Left Ventricle: Left ventricular ejection fraction, by estimation, is 60 to 65%. The left ventricle has normal function. The left ventricle has no regional wall motion abnormalities. The left ventricular internal cavity size was normal in size. There is  no left ventricular hypertrophy. Left ventricular diastolic parameters are consistent with Grade I diastolic dysfunction (impaired relaxation). Right Ventricle: The right ventricular size is normal. No increase in right ventricular wall thickness. Right ventricular systolic function is normal. There is mildly elevated pulmonary artery systolic pressure. The tricuspid regurgitant velocity is 2.95  m/s, and with an assumed right atrial pressure of 3 mmHg, the estimated right ventricular systolic pressure is 37.8 mmHg. Left Atrium: Left atrial size was normal in size. Right Atrium: Right atrial size was normal in size. Pericardium: There is no evidence of pericardial effusion. Mitral Valve: The mitral valve is normal in structure. Trivial mitral valve regurgitation. No evidence of mitral valve stenosis. Tricuspid Valve: The tricuspid valve is normal in structure. Tricuspid valve regurgitation is trivial. No evidence of  tricuspid stenosis. Aortic Valve: The aortic valve is normal in structure. Aortic valve regurgitation is not visualized. No aortic stenosis is present. Pulmonic Valve: The pulmonic valve was normal in structure. Pulmonic valve regurgitation is not visualized. No evidence of pulmonic stenosis. Aorta: The aortic root is normal in size and structure. Venous: The inferior vena cava is normal in size with greater than 50% respiratory variability, suggesting right atrial pressure of 3 mmHg. IAS/Shunts: No atrial level shunt detected by color flow Doppler. Agitated saline contrast was given intravenously to evaluate for intracardiac shunting. Agitated saline contrast bubble study was negative, with no evidence of any interatrial shunt.  LEFT VENTRICLE PLAX 2D LVIDd:         3.60 cm   Diastology LVIDs:         2.90 cm   LV e' medial:    9.36 cm/s LV PW:         1.10 cm   LV E/e' medial:  12.0 LV IVS:        1.10 cm   LV e' lateral:   9.90 cm/s LVOT diam:     2.00 cm   LV E/e' lateral: 11.3 LV SV:         112 LV SV Index:   62 LVOT Area:     3.14 cm  RIGHT VENTRICLE RV Basal diam:  2.20 cm RV S prime:     12.20 cm/s TAPSE (M-mode): 2.4 cm LEFT ATRIUM             Index        RIGHT ATRIUM           Index LA diam:        3.20 cm 1.79 cm/m   RA Area:     11.20 cm LA Vol (A2C):   32.1 ml 17.95 ml/m  RA Volume:   23.00 ml  12.86 ml/m LA Vol (A4C):   26.9 ml 15.04 ml/m LA Biplane Vol: 30.3 ml 16.94 ml/m  AORTIC VALVE LVOT Vmax:   175.00 cm/s LVOT Vmean:  108.000 cm/s LVOT VTI:  0.355 m  AORTA Ao Root diam: 3.00 cm Ao Asc diam:  3.50 cm MITRAL VALVE                TRICUSPID VALVE MV Area (PHT): 3.65 cm     TR Peak grad:   34.8 mmHg MV Decel Time: 208 msec     TR Vmax:        295.00 cm/s MV E velocity: 112.00 cm/s MV A velocity: 118.00 cm/s  SHUNTS MV E/A ratio:  0.95         Systemic VTI:  0.36 m                             Systemic Diam: 2.00 cm Dorothye Gathers MD Electronically signed by Dorothye Gathers MD Signature Date/Time:  12/17/2023/9:45:15 PM    Final    CT ANGIO HEAD NECK W WO CM Result Date: 12/17/2023 CLINICAL DATA:  Stroke/TIA, determine embolic source EXAM: CT ANGIOGRAPHY HEAD AND NECK WITH AND WITHOUT CONTRAST TECHNIQUE: Multidetector CT imaging of the head and neck was performed using the standard protocol during bolus administration of intravenous contrast. Multiplanar CT image reconstructions and MIPs were obtained to evaluate the vascular anatomy. Carotid stenosis measurements (when applicable) are obtained utilizing NASCET criteria, using the distal internal carotid diameter as the denominator. RADIATION DOSE REDUCTION: This exam was performed according to the departmental dose-optimization program which includes automated exposure control, adjustment of the mA and/or kV according to patient size and/or use of iterative reconstruction technique. CONTRAST:  75mL OMNIPAQUE IOHEXOL 350 MG/ML SOLN COMPARISON:  MRI head and CTA head April 14, 25. FINDINGS: CT HEAD FINDINGS Brain: Punctate infarcts better seen on recent MRI. No acute hemorrhage, hydrocephalus, extra-axial collection or mass lesion/mass effect. Patchy white matter hypodensities, nonspecific but compatible with chronic microvascular ischemic disease. Vascular: See below. Skull: No acute fracture. Sinuses/Orbits: No acute findings. Review of the MIP images confirms the above findings CTA NECK FINDINGS Aortic arch: Incompletely imaged. The visualized great vessel origins are patent. Right carotid system: No evidence of dissection, stenosis (50% or greater), or occlusion. Left carotid system: No evidence of dissection, stenosis (50% or greater), or occlusion. Vertebral arteries: Codominant. No evidence of dissection, stenosis (50% or greater), or occlusion. Mild irregularity the distal right V2 vertebral artery. Skeleton: No evidence of acute fracture. Multilevel degenerative change. Other neck: No evidence of acute abnormality on limited assessment. Upper chest:  Visualized lung apices are clear. Review of the MIP images confirms the above findings CTA HEAD FINDINGS Anterior circulation: Bilateral intracranial ICAs, MCAs, and ACAs are patent without proximal hemodynamically significant stenosis. Posterior circulation: Bilateral intradural vertebral arteries, basilar artery and bilateral posterior cerebral arteries are patent without proximal hemodynamically significant stenosis. Venous sinuses: As permitted by contrast timing, patent. Review of the MIP images confirms the above findings IMPRESSION: No large vessel occlusion or proximal hemodynamically significant stenosis. Electronically Signed   By: Stevenson Elbe M.D.   On: 12/17/2023 19:25   MR BRAIN WO CONTRAST Result Date: 12/16/2023 CLINICAL DATA:  Neuro deficit, acute, stroke suspected. EXAM: MRI HEAD WITHOUT CONTRAST TECHNIQUE: Multiplanar, multiecho pulse sequences of the brain and surrounding structures were obtained without intravenous contrast. COMPARISON:  Head CT 12/16/2023 FINDINGS: Brain: There are single punctate acute cortical infarcts in both parietal lobes. No intracranial hemorrhage, mass, midline shift, or extra-axial fluid collection is identified. T2 hyperintensities in the cerebral white matter bilaterally are nonspecific but compatible with mild chronic small vessel  ischemic disease. There is mild-to-moderate cerebral atrophy. Vascular: Major intracranial vascular flow voids are preserved. Skull and upper cervical spine: Unremarkable bone marrow signal. Sinuses/Orbits: Bilateral cataract extraction. Mild mucosal thickening in the right maxillary sinus. No significant mastoid fluid. Other: None. IMPRESSION: 1. Punctate acute bilateral parietal cortical infarcts. 2. Mild chronic small vessel ischemic disease and cerebral atrophy. Electronically Signed   By: Aundra Lee M.D.   On: 12/16/2023 21:07   DG Chest 2 View Result Date: 12/16/2023 CLINICAL DATA:  Chest pain. EXAM: CHEST - 2 VIEW  COMPARISON:  06/27/2017. FINDINGS: Low lung volume. Bilateral lung fields are clear. No acute consolidation or lung collapse. There is mild prominence of interstitial markings, which is nonspecific. No frank pulmonary edema. Bilateral costophrenic angles are clear. Normal cardio-mediastinal silhouette. No acute osseous abnormalities. The soft tissues are within normal limits. IMPRESSION: No active cardiopulmonary disease. Electronically Signed   By: Beula Brunswick M.D.   On: 12/16/2023 17:03   CT HEAD WO CONTRAST Result Date: 12/16/2023 CLINICAL DATA:  Neuro deficit, concern for stroke. Dizziness, slurred speech, facial asymmetry. EXAM: CT HEAD WITHOUT CONTRAST TECHNIQUE: Contiguous axial images were obtained from the base of the skull through the vertex without intravenous contrast. RADIATION DOSE REDUCTION: This exam was performed according to the departmental dose-optimization program which includes automated exposure control, adjustment of the mA and/or kV according to patient size and/or use of iterative reconstruction technique. COMPARISON:  None Available. FINDINGS: Brain: No acute intracranial hemorrhage. No CT evidence of acute infarct. Nonspecific hypoattenuation in the periventricular and subcortical white matter favored to reflect chronic microvascular ischemic changes. 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 No edema, mass effect, or midline shift. The basilar cisterns are patent. Ventricles: Prominence of the ventricles suggesting underlying parenchymal volume loss. Vascular: Atherosclerotic calcifications of the carotid siphons. No hyperdense vessel. Skull: No acute or aggressive finding. Orbits: Orbits are symmetric. Sinuses: Mucosal thickening in the right maxillary sinus with findings suggestive of mucoperiosteal reaction. 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 Other: Mastoid air cells are clear. IMPRESSION: No CT evidence of acute  intracranial abnormality. Mild chronic microvascular ischemic changes and mild parenchymal volume loss. 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 Electronically Signed   By: Denny Flack M.D.   On: 12/16/2023 15:54   Scheduled Meds:  apixaban  5 mg Oral BID   rosuvastatin  20 mg Oral Daily   sodium chloride flush  3 mL Intravenous Q12H   Continuous Infusions:     LOS: 0 days   Time spent:  Haydee Lipa, DO [facility]  If 7PM-7AM, please contact night-coverage [email or web address]  12/18/2023, 7:20 AM

## 2023-12-18 NOTE — Evaluation (Signed)
 Occupational Therapy Evaluation Patient Details Name: [name] MRN: [id number] DOB: 04/23/1941 Today's Date: 12/18/2023   History of Present Illness   83 y.o. female presents to [facility] hospital on 12/16/2023 after a syncopal episode and fall. After the fall pt was noted to have slurred speech and facial droop. MRI notable for bilateral parietal infarcts. PMH includes HTN, HLD, GERD, uterine CA, PE, DVT.     Clinical Impressions PTA pt livs alone independently. PT states she had diarrhea for several days before passing out at home and feels generally weak and very painful. Pt currently requires mod A for bed mobility without use of rails to simulate home and mod A with LB ADL. Pt diaphoretic and orthostatic with mobility. BP sit 155/72, HR84; standing 122/79, HR121; sit 140/96. T states she got "very hot and sweaty" when walking with PT earlier and felt "horrible". Pt has very limited support at DC. At this time feel Patient will benefit from continued inpatient follow up therapy, <3 hours/day. If she progresses, she may be able to DC home with [facility]. Acute OT to follow.  Pt very tearful and concnered about her "will and papers". Chaplain notified.      If plan is discharge home, recommend the following:   A little help with walking and/or transfers;A little help with bathing/dressing/bathroom;Assistance with cooking/housework;Assist for transportation;Help with stairs or ramp for entrance     Functional Status Assessment   Patient has had a recent decline in their functional status and demonstrates the ability to make significant improvements in function in a reasonable and predictable amount of time.     Equipment Recommendations   Tub/shower seat     Recommendations for Other Services         Precautions/Restrictions   Precautions Precautions: Fall Recall of Precautions/Restrictions: Intact Precaution/Restrictions Comments: orthostatic Restrictions Weight Bearing  Restrictions Per Provider Order: No     Mobility Bed Mobility Overal bed mobility: Needs Assistance Bed Mobility: Supine to Sit     Supine to sit: Mod assist          Transfers Overall transfer level: Needs assistance Equipment used: None Transfers: Sit to/from Stand Sit to Stand: Contact guard assist           General transfer comment: increased time, pain at chest when pushing through UE; diaphoretic; tachy      Balance Overall balance assessment: Needs assistance Sitting-balance support: No upper extremity supported, Feet supported Sitting balance-Leahy Scale: Good     Standing balance support: Single extremity supported, Reliant on assistive device for balance Standing balance-Leahy Scale: Poor                             ADL either performed or assessed with clinical judgement   ADL Overall ADL's : Needs assistance/impaired Eating/Feeding: Independent   Grooming: Set up;Sitting   Upper Body Bathing: Set up;Sitting   Lower Body Bathing: Moderate assistance;Sit to/from stand   Upper Body Dressing : Minimal assistance;Sitting   Lower Body Dressing: Moderate assistance;Sit to/from stand   Toilet Transfer: Contact guard assist;Ambulation   Toileting- Clothing Manipulation and Hygiene: Minimal assistance       Functional mobility during ADLs: Minimal assistance General ADL Comments: unable to reach feet for LB ADL due to pain     Vision Baseline Vision/History: 1 Wears glasses Vision Assessment?: Wears glasses for reading     Perception         Praxis  Pertinent Vitals/Pain Pain Assessment Pain Assessment: 0-10 Pain Score: 10-Worst pain ever Pain Location: chest Pain Descriptors / Indicators: Sore Pain Intervention(s): Limited activity within patient's tolerance     Extremity/Trunk Assessment Upper Extremity Assessment Upper Extremity Assessment: Generalized weakness   Lower Extremity Assessment Lower Extremity  Assessment: Defer to PT evaluation   Cervical / Trunk Assessment Cervical / Trunk Assessment: Normal;Other exceptions (painful sternum)   Communication Communication Communication: No apparent difficulties   Cognition Arousal: Alert Behavior During Therapy: Anxious (crying/tearful) Cognition: No family/caregiver present to determine baseline             OT - Cognition Comments: will further assess; pt very upset                 Following commands: Intact       Cueing  General Comments   Cueing Techniques: Verbal cues      Exercises Exercises: Other exercises Other Exercises Other Exercises: encouraged pursed lip breathing   Shoulder Instructions      Home Living Family/patient expects to be discharged to:: Private residence Living Arrangements: Alone Available Help at Discharge: Friend(s);Available PRN/intermittently Type of Home: Mobile home Home Access: Stairs to enter Entrance Stairs-Number of Steps: 4 Entrance Stairs-Rails: Can reach both Home Layout: One level     Bathroom Shower/Tub: Chief Strategy Officer: Standard     Home Equipment: Agricultural consultant (2 wheels);Toilet riser          Prior Functioning/Environment Prior Level of Function : Independent/Modified Independent;Driving             Mobility Comments: ambulatory without DME      OT Problem List: Decreased strength;Decreased range of motion;Decreased activity tolerance;Impaired balance (sitting and/or standing);Decreased safety awareness;Decreased knowledge of use of DME or AE;Cardiopulmonary status limiting activity;Obesity;Pain   OT Treatment/Interventions: Self-care/ADL training;Therapeutic exercise;DME and/or AE instruction;Therapeutic activities;Patient/family education;Balance training      OT Goals(Current goals can be found in the care plan section)   Acute Rehab OT Goals Patient Stated Goal: to feel better OT Goal Formulation: With patient Time For  Goal Achievement: 01/01/24 Potential to Achieve Goals: Good   OT Frequency:  Min 2X/week    Co-evaluation              AM-PAC OT "6 Clicks" Daily Activity     Outcome Measure Help from another person eating meals?: None Help from another person taking care of personal grooming?: A Little Help from another person toileting, which includes using toliet, bedpan, or urinal?: A Little Help from another person bathing (including washing, rinsing, drying)?: A Lot Help from another person to put on and taking off regular upper body clothing?: A Little Help from another person to put on and taking off regular lower body clothing?: A Lot 6 Click Score: 17   End of Session Equipment Utilized During Treatment: Gait belt Nurse Communication: Mobility status;Other (comment)  Activity Tolerance: Patient limited by pain Patient left: in chair;with call bell/phone within reach;with chair alarm set  OT Visit Diagnosis: Unsteadiness on feet (R26.81);Muscle weakness (generalized) (M62.81);History of falling (Z91.81);Pain Pain - part of body:  (chest)                Time: 1610-9604 OT Time Calculation (min): 33 min Charges:  OT General Charges $OT Visit: 1 Visit OT Evaluation $OT Eval Moderate Complexity: 1 Mod OT Treatments $Self Care/Home Management : 8-22 mins  Milburn Aliment, OT/L   Acute OT Clinical Specialist Acute Rehabilitation Services Pager [phone] Office  [phone]   Marita Burnsed,HILLARY 12/18/2023, 2:20 PM

## 2023-12-18 NOTE — Progress Notes (Signed)
 STROKE TEAM PROGRESS NOTE   SUBJECTIVE (INTERVAL HISTORY) Her 2 female friends are at the bedside.  Overall her condition is stable. Her diarrhea has resolved. Stroke work up completed. Increased eliquis to 5mg  bid. PT and OT recommend outpt.    OBJECTIVE Temp:  [97.8 F (36.6 C)-98.4 F (36.9 C)] 98.1 F (36.7 C) (04/16 1155) Pulse Rate:  [64-90] 90 (04/16 1155) Cardiac Rhythm: Heart block (04/16 0806) Resp:  [16-18] 16 (04/16 1155) BP: (115-156)/(51-70) 147/62 (04/16 1155) SpO2:  [96 %-100 %] 96 % (04/16 1155)  Recent Labs  Lab 12/16/23 1315  GLUCAP 169*   Recent Labs  Lab 12/16/23 1317 12/16/23 1322 12/17/23 0519 12/18/23 0606  NA 140 137 139 137  K 3.7 3.6 3.0* 4.0  CL 104 102 98 103  CO2 24  --  29 24  GLUCOSE 175* 173* 128* 115*  BUN 14 14 11 10   CREATININE 0.84 0.80 0.95 0.57  CALCIUM 8.7*  --  8.4* 8.5*   Recent Labs  Lab 12/16/23 1317 12/17/23 0519  AST 32 32  ALT 21 19  ALKPHOS 18* 20*  BILITOT 0.7 1.2  PROT 6.4* 6.2*  ALBUMIN 3.2* 3.0*   Recent Labs  Lab 12/16/23 1317 12/16/23 1322 12/17/23 0519 12/18/23 0606  WBC 18.9*  --  15.6* 14.6*  NEUTROABS 16.8*  --   --   --   HGB 13.2 13.9 12.7 12.6  HCT 40.0 41.0 37.0 37.8  MCV 98.0  --  93.7 96.2  PLT 187  --  176 151   No results for input(s): "CKTOTAL", "CKMB", "CKMBINDEX", "TROPONINI" in the last 168 hours. Recent Labs    12/16/23 1317  LABPROT 13.8  INR 1.0   Recent Labs    12/17/23 1441  COLORURINE YELLOW  LABSPEC >1.046*  PHURINE 8.0  GLUCOSEU NEGATIVE  HGBUR NEGATIVE  BILIRUBINUR NEGATIVE  KETONESUR NEGATIVE  PROTEINUR NEGATIVE  NITRITE NEGATIVE  LEUKOCYTESUR NEGATIVE       Component Value Date/Time   CHOL 93 12/18/2023 0606   TRIG 64 12/18/2023 0606   HDL 52 12/18/2023 0606   CHOLHDL 1.8 12/18/2023 0606   VLDL 13 12/18/2023 0606   LDLCALC 28 12/18/2023 0606   Lab Results  Component Value Date   HGBA1C 5.7 (H) 12/18/2023   No results found for: "LABOPIA",  "COCAINSCRNUR", "LABBENZ", "AMPHETMU", "THCU", "LABBARB"  Recent Labs  Lab 12/16/23 1317  ETH <10    I have personally reviewed the radiological images below and agree with the radiology interpretations.  ECHOCARDIOGRAM COMPLETE BUBBLE STUDY Result Date: 12/17/2023    ECHOCARDIOGRAM REPORT   Patient Name:   [name] Date of Exam: 12/17/2023 Medical Rec #:  [id number]      Height:       64.0 in Accession #:    [id number]     Weight:       162.0 lb Date of Birth:  [birth year]      BSA:          1.789 m Patient Age:    83 years       BP:           140/51 mmHg Patient Gender: F              HR:           66 bpm. Exam Location:  Inpatient Procedure: 2D Echo (Both Spectral and Color Flow Doppler were utilized during            procedure).  Indications:    syncope  History:        Patient has no prior history of Echocardiogram examinations.                 Risk Factors:Hypertension and Dyslipidemia.  Sonographer:    Delcie Roch RDCS Referring Phys: [id number] Cecille Po MELVIN IMPRESSIONS  1. Left ventricular ejection fraction, by estimation, is 60 to 65%. The left ventricle has normal function. The left ventricle has no regional wall motion abnormalities. Left ventricular diastolic parameters are consistent with Grade I diastolic dysfunction (impaired relaxation).  2. Right ventricular systolic function is normal. The right ventricular size is normal. There is mildly elevated pulmonary artery systolic pressure. The estimated right ventricular systolic pressure is 37.8 mmHg.  3. The mitral valve is normal in structure. Trivial mitral valve regurgitation. No evidence of mitral stenosis.  4. The aortic valve is normal in structure. Aortic valve regurgitation is not visualized. No aortic stenosis is present.  5. The inferior vena cava is normal in size with greater than 50% respiratory variability, suggesting right atrial pressure of 3 mmHg.  6. Agitated saline contrast bubble study was negative, with no  evidence of any interatrial shunt. FINDINGS  Left Ventricle: Left ventricular ejection fraction, by estimation, is 60 to 65%. The left ventricle has normal function. The left ventricle has no regional wall motion abnormalities. The left ventricular internal cavity size was normal in size. There is  no left ventricular hypertrophy. Left ventricular diastolic parameters are consistent with Grade I diastolic dysfunction (impaired relaxation). Right Ventricle: The right ventricular size is normal. No increase in right ventricular wall thickness. Right ventricular systolic function is normal. There is mildly elevated pulmonary artery systolic pressure. The tricuspid regurgitant velocity is 2.95  m/s, and with an assumed right atrial pressure of 3 mmHg, the estimated right ventricular systolic pressure is 37.8 mmHg. Left Atrium: Left atrial size was normal in size. Right Atrium: Right atrial size was normal in size. Pericardium: There is no evidence of pericardial effusion. Mitral Valve: The mitral valve is normal in structure. Trivial mitral valve regurgitation. No evidence of mitral valve stenosis. Tricuspid Valve: The tricuspid valve is normal in structure. Tricuspid valve regurgitation is trivial. No evidence of tricuspid stenosis. Aortic Valve: The aortic valve is normal in structure. Aortic valve regurgitation is not visualized. No aortic stenosis is present. Pulmonic Valve: The pulmonic valve was normal in structure. Pulmonic valve regurgitation is not visualized. No evidence of pulmonic stenosis. Aorta: The aortic root is normal in size and structure. Venous: The inferior vena cava is normal in size with greater than 50% respiratory variability, suggesting right atrial pressure of 3 mmHg. IAS/Shunts: No atrial level shunt detected by color flow Doppler. Agitated saline contrast was given intravenously to evaluate for intracardiac shunting. Agitated saline contrast bubble study was negative, with no evidence of any  interatrial shunt.  LEFT VENTRICLE PLAX 2D LVIDd:         3.60 cm   Diastology LVIDs:         2.90 cm   LV e' medial:    9.36 cm/s LV PW:         1.10 cm   LV E/e' medial:  12.0 LV IVS:        1.10 cm   LV e' lateral:   9.90 cm/s LVOT diam:     2.00 cm   LV E/e' lateral: 11.3 LV SV:         112 LV SV Index:  62 LVOT Area:     3.14 cm  RIGHT VENTRICLE RV Basal diam:  2.20 cm RV S prime:     12.20 cm/s TAPSE (M-mode): 2.4 cm LEFT ATRIUM             Index        RIGHT ATRIUM           Index LA diam:        3.20 cm 1.79 cm/m   RA Area:     11.20 cm LA Vol (A2C):   32.1 ml 17.95 ml/m  RA Volume:   23.00 ml  12.86 ml/m LA Vol (A4C):   26.9 ml 15.04 ml/m LA Biplane Vol: 30.3 ml 16.94 ml/m  AORTIC VALVE LVOT Vmax:   175.00 cm/s LVOT Vmean:  108.000 cm/s LVOT VTI:    0.355 m  AORTA Ao Root diam: 3.00 cm Ao Asc diam:  3.50 cm MITRAL VALVE                TRICUSPID VALVE MV Area (PHT): 3.65 cm     TR Peak grad:   34.8 mmHg MV Decel Time: 208 msec     TR Vmax:        295.00 cm/s MV E velocity: 112.00 cm/s MV A velocity: 118.00 cm/s  SHUNTS MV E/A ratio:  0.95         Systemic VTI:  0.36 m                             Systemic Diam: 2.00 cm Dorothye Gathers MD Electronically signed by Dorothye Gathers MD Signature Date/Time: 12/17/2023/9:45:15 PM    Final    CT ANGIO HEAD NECK W WO CM Result Date: 12/17/2023 CLINICAL DATA:  Stroke/TIA, determine embolic source EXAM: CT ANGIOGRAPHY HEAD AND NECK WITH AND WITHOUT CONTRAST TECHNIQUE: Multidetector CT imaging of the head and neck was performed using the standard protocol during bolus administration of intravenous contrast. Multiplanar CT image reconstructions and MIPs were obtained to evaluate the vascular anatomy. Carotid stenosis measurements (when applicable) are obtained utilizing NASCET criteria, using the distal internal carotid diameter as the denominator. RADIATION DOSE REDUCTION: This exam was performed according to the departmental dose-optimization program which includes  automated exposure control, adjustment of the mA and/or kV according to patient size and/or use of iterative reconstruction technique. CONTRAST:  75mL OMNIPAQUE IOHEXOL 350 MG/ML SOLN COMPARISON:  MRI head and CTA head April 14, 25. FINDINGS: CT HEAD FINDINGS Brain: Punctate infarcts better seen on recent MRI. No acute hemorrhage, hydrocephalus, extra-axial collection or mass lesion/mass effect. Patchy white matter hypodensities, nonspecific but compatible with chronic microvascular ischemic disease. Vascular: See below. Skull: No acute fracture. Sinuses/Orbits: No acute findings. Review of the MIP images confirms the above findings CTA NECK FINDINGS Aortic arch: Incompletely imaged. The visualized great vessel origins are patent. Right carotid system: No evidence of dissection, stenosis (50% or greater), or occlusion. Left carotid system: No evidence of dissection, stenosis (50% or greater), or occlusion. Vertebral arteries: Codominant. No evidence of dissection, stenosis (50% or greater), or occlusion. Mild irregularity the distal right V2 vertebral artery. Skeleton: No evidence of acute fracture. Multilevel degenerative change. Other neck: No evidence of acute abnormality on limited assessment. Upper chest: Visualized lung apices are clear. Review of the MIP images confirms the above findings CTA HEAD FINDINGS Anterior circulation: Bilateral intracranial ICAs, MCAs, and ACAs are patent without proximal hemodynamically significant stenosis. Posterior circulation: Bilateral intradural  vertebral arteries, basilar artery and bilateral posterior cerebral arteries are patent without proximal hemodynamically significant stenosis. Venous sinuses: As permitted by contrast timing, patent. Review of the MIP images confirms the above findings IMPRESSION: No large vessel occlusion or proximal hemodynamically significant stenosis. Electronically Signed   By: Stevenson Elbe M.D.   On: 12/17/2023 19:25   MR BRAIN WO  CONTRAST Result Date: 12/16/2023 CLINICAL DATA:  Neuro deficit, acute, stroke suspected. EXAM: MRI HEAD WITHOUT CONTRAST TECHNIQUE: Multiplanar, multiecho pulse sequences of the brain and surrounding structures were obtained without intravenous contrast. COMPARISON:  Head CT 12/16/2023 FINDINGS: Brain: There are single punctate acute cortical infarcts in both parietal lobes. No intracranial hemorrhage, mass, midline shift, or extra-axial fluid collection is identified. T2 hyperintensities in the cerebral white matter bilaterally are nonspecific but compatible with mild chronic small vessel ischemic disease. There is mild-to-moderate cerebral atrophy. Vascular: Major intracranial vascular flow voids are preserved. Skull and upper cervical spine: Unremarkable bone marrow signal. Sinuses/Orbits: Bilateral cataract extraction. Mild mucosal thickening in the right maxillary sinus. No significant mastoid fluid. Other: None. IMPRESSION: 1. Punctate acute bilateral parietal cortical infarcts. 2. Mild chronic small vessel ischemic disease and cerebral atrophy. Electronically Signed   By: Aundra Lee M.D.   On: 12/16/2023 21:07   DG Chest 2 View Result Date: 12/16/2023 CLINICAL DATA:  Chest pain. EXAM: CHEST - 2 VIEW COMPARISON:  06/27/2017. FINDINGS: Low lung volume. Bilateral lung fields are clear. No acute consolidation or lung collapse. There is mild prominence of interstitial markings, which is nonspecific. No frank pulmonary edema. Bilateral costophrenic angles are clear. Normal cardio-mediastinal silhouette. No acute osseous abnormalities. The soft tissues are within normal limits. IMPRESSION: No active cardiopulmonary disease. Electronically Signed   By: Beula Brunswick M.D.   On: 12/16/2023 17:03   CT HEAD WO CONTRAST Result Date: 12/16/2023 CLINICAL DATA:  Neuro deficit, concern for stroke. Dizziness, slurred speech, facial asymmetry. EXAM: CT HEAD WITHOUT CONTRAST TECHNIQUE: Contiguous axial images were  obtained from the base of the skull through the vertex without intravenous contrast. RADIATION DOSE REDUCTION: This exam was performed according to the departmental dose-optimization program which includes automated exposure control, adjustment of the mA and/or kV according to patient size and/or use of iterative reconstruction technique. COMPARISON:  None Available. FINDINGS: Brain: No acute intracranial hemorrhage. No CT evidence of acute infarct. Nonspecific hypoattenuation in the periventricular and subcortical white matter favored to reflect chronic microvascular ischemic changes. 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 No edema, mass effect, or midline shift. The basilar cisterns are patent. Ventricles: Prominence of the ventricles suggesting underlying parenchymal volume loss. Vascular: Atherosclerotic calcifications of the carotid siphons. No hyperdense vessel. Skull: No acute or aggressive finding. Orbits: Orbits are symmetric. Sinuses: Mucosal thickening in the right maxillary sinus with findings suggestive of mucoperiosteal reaction. 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 Other: Mastoid air cells are clear. IMPRESSION: No CT evidence of acute intracranial abnormality. Mild chronic microvascular ischemic changes and mild parenchymal volume loss. 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 Electronically Signed   By: Denny Flack M.D.   On: 12/16/2023 15:54     PHYSICAL EXAM  Temp:  [97.8 F (36.6 C)-98.4 F (36.9 C)] 98.1 F (36.7 C) (04/16 1155) Pulse Rate:  [64-90] 90 (04/16 1155) Resp:  [16-18] 16 (04/16 1155) BP: (115-156)/(51-70) 147/62 (04/16 1155) SpO2:  [96 %-100 %] 96 % (04/16 1155)  General - Well nourished,  well developed, in no apparent distress.  Ophthalmologic - fundi not visualized due to noncooperation.  Cardiovascular - Regular rhythm and rate.  Mental Status -  Level  of arousal and orientation to time, place, and person were intact. Language including expression, naming, repetition, comprehension was assessed and found intact.  Mild dysarthria Attention span and concentration were normal. Recent and remote memory were intact. Fund of Knowledge was assessed and was intact.   Cranial Nerves II - XII - II - Visual field intact OU. III, IV, VI - Extraocular movements intact. V - Facial sensation intact bilaterally. VII  left facial droop VIII - Hearing & vestibular intact bilaterally. X - Palate elevates symmetrically. XI - Chin turning & shoulder shrug intact bilaterally. XII - Tongue protrusion intact.   Motor Strength - The patient's strength was normal in all extremities and pronator drift was absent.  Bulk was normal and fasciculations were absent.   Motor Tone - Muscle tone was assessed at the neck and appendages and was normal. Sensory - Light touch, temperature/pinprick were assessed and were symmetrical.   Coordination - The patient had normal movements in the hands and feet with no ataxia or dysmetria.  Tremor was absent. Gait and Station - deferred.   ASSESSMENT/PLAN Ms. [name] is a 83 y.o. female with history of hypertension, hyperlipidemia, uterine cancer, DVT 1997, recurrent DVT and PE 2017 on Eliquis 2.5 twice daily presented for diarrhea and generalized weakness with dizziness and syncope. On waking up, patient was found to have slurred speech and a mild left facial droop. Patient also complained of chest pain due to trauma on the chest during fall with syncope. No TNK given due to outside window.    Stroke:  4 punctate watershed infarcts at bilateral MCA/PCA and left MCA/ACA as well as right cerebellum, likely compromised brain perfusion due to diarrhea, dehydration and syncope. Can not completely rule out cardioembolic source with underdosing of eliquis CT no acute abnormality, but remote left subinsular and right parasagittal  frontal infarcts.  CTA head and neck unremarkable MRI  Punctate acute bilateral parietal cortical infarcts. Also one right cerebellar small infarct.  2D Echo  EF 60-65%, no PFO LDL 28 HgbA1c 5.7 Eliquis for VTE prophylaxis Eliquis (apixaban) 2.5mg  bid prior to admission, now on Eliquis (apixaban) 5mg  bid. Patient counseled to be compliant with her antithrombotic medications Ongoing aggressive stroke risk factor management Therapy recommendations:  outpt PT Disposition:  pending  Recurrent DVT/PE Per record, she had DVT in 1997 and then DVT/PE in 2017 Followed with oncology in 2018, considered provoked and did not recommend to continue [facility] But apparently pt is still on eliquis 2.5mg  bid at home Given the current stroke embolic pattern and can not completely rule out cardioembolic source, will continue eliquis at this time.  Discussed with pharmacy, eliquis dose now 5mg  bid normal dosing.   Hypertension Stable  Long term BP goal normotensive  Hyperlipidemia Home meds:  crestor 20  LDL 28, goal < 70 Now on home crestor 20 Continue statin at discharge  Other Stroke Risk Factors Advanced age  Other Active Problems Hx of endometrial cancer  Hospital day # 0  Neurology will sign off. Please call with questions. Pt will follow up with stroke clinic NP at [facility] in about 4 weeks. Thanks for the consult.   Marvel Plan, MD PhD Stroke Neurology 12/18/2023 12:04 PM    To contact Stroke Continuity provider, please refer to [email or web address]. After hours, contact General Neurology

## 2023-12-18 NOTE — TOC Progression Note (Signed)
 Transition of Care [facility]) - Progression Note    Patient Details  Name: [name] MRN: [id number] Date of Birth: [birth year]  [facility]) CM/SW Contact  Jonathan Neighbor, RN Phone Number: 12/18/2023, 3:09 PM  Clinical Narrative:     Recommendations are changing to SNF rehab. Pt is in agreement. She asked to be faxed out in the [facility] area.  TOC will provide bed offers when available.    Expected Discharge Plan: Home w Home Health Services    Expected Discharge Plan and Services   Discharge Planning Services: CM Consult   Living arrangements for the past 2 months: Single Family Home                                       Social Determinants of Health (SDOH) Interventions SDOH Screenings   Food Insecurity: No Food Insecurity (12/17/2023)  Housing: Low Risk  (12/17/2023)  Transportation Needs: No Transportation Needs (12/17/2023)  Utilities: Not At Risk (12/17/2023)  Social Connections: Moderately Integrated (12/17/2023)  Tobacco Use: Low Risk  (12/16/2023)    Readmission Risk Interventions     No data to display

## 2023-12-19 ENCOUNTER — Ambulatory Visit: Payer: Self-pay | Admitting: Cardiology

## 2023-12-19 ENCOUNTER — Observation Stay (HOSPITAL_COMMUNITY)

## 2023-12-19 DIAGNOSIS — I6340 Cerebral infarction due to embolism of unspecified cerebral artery: Secondary | ICD-10-CM | POA: Diagnosis not present

## 2023-12-19 DIAGNOSIS — Z7901 Long term (current) use of anticoagulants: Secondary | ICD-10-CM | POA: Diagnosis not present

## 2023-12-19 LAB — GASTROINTESTINAL PANEL BY PCR, STOOL (REPLACES STOOL CULTURE)

## 2023-12-19 LAB — CBC
HCT: 42.5 % (ref 36.0–46.0)
Hemoglobin: 14.3 g/dL (ref 12.0–15.0)
MCH: 31.9 pg (ref 26.0–34.0)
MCHC: 33.6 g/dL (ref 30.0–36.0)
MCV: 94.9 fL (ref 80.0–100.0)
Platelets: 217 10*3/uL (ref 150–400)
RBC: 4.48 MIL/uL (ref 3.87–5.11)
RDW: 13.9 % (ref 11.5–15.5)
WBC: 18.1 10*3/uL — ABNORMAL HIGH (ref 4.0–10.5)
nRBC: 0 % (ref 0.0–0.2)

## 2023-12-19 LAB — OCCULT BLOOD GASTRIC / DUODENUM (SPECIMEN CUP): Occult Blood, Gastric: POSITIVE — AB

## 2023-12-19 MED ORDER — PIPERACILLIN-TAZOBACTAM 3.375 G IVPB
3.3750 g | Freq: Three times a day (TID) | INTRAVENOUS | Status: DC
  Administered 2023-12-20 (×2): 3.375 g via INTRAVENOUS
  Filled 2023-12-19 (×3): qty 50

## 2023-12-19 MED ORDER — SODIUM CHLORIDE 0.9 % IV SOLN: 25.0000 mg | Freq: Four times a day (QID) | INTRAVENOUS | Status: DC | PRN

## 2023-12-19 MED ORDER — ONDANSETRON HCL 4 MG/2ML IJ SOLN
4.0000 mg | Freq: Four times a day (QID) | INTRAMUSCULAR | Status: DC | PRN
  Administered 2023-12-19 (×2): 4 mg via INTRAVENOUS
  Filled 2023-12-19 (×2): qty 2

## 2023-12-19 MED ORDER — SODIUM CHLORIDE 0.9 % IV SOLN
12.5000 mg | Freq: Four times a day (QID) | INTRAVENOUS | Status: DC | PRN
  Administered 2023-12-19: 12.5 mg via INTRAVENOUS
  Filled 2023-12-19: qty 12.5

## 2023-12-19 NOTE — NC FL2 (Signed)
 [place] MEDICAID FL2 LEVEL OF CARE FORM     IDENTIFICATION  Patient Name: [name] Birthdate: [birth year] Sex: female Admission Date (Current Location): 12/16/2023  [facility] and [place] Number:  Producer, television/film/video and Address:  [facility] [place], [address], [place], [place] [address]      Provider Number: [id number]  Attending Physician Name and Address:  Azucena Fallen, MD  Relative Name and Phone Number:       Current Level of Care: Hospital Recommended Level of Care: Skilled Nursing Facility Prior Approval Number:    Date Approved/Denied:   PASRR Number: [id number]  Discharge Plan: SNF    Current Diagnoses: Patient Active Problem List   Diagnosis Date Noted   Focal neurological deficit 12/16/2023   Dyspnea on exertion 11/16/2022   Acute on chronic heart failure with preserved ejection fraction (HCC) 08/17/2022   Vaginal atrophy 02/18/2020   Abnormal pelvic exam 02/18/2020   Pelvic relaxation due to vaginal prolapse 02/18/2020   Lower abdominal pain 02/18/2020   Essential hypertension 09/25/2019   Mixed hyperlipidemia 09/25/2019   Sinus bradycardia 09/25/2019   Laboratory examination 11/17/2018   Gastroesophageal reflux disease without esophagitis 10/30/2016   Right leg DVT (HCC) 08/07/2016   Hx of pulmonary embolus 08/07/2016   Burst fracture of lumbar vertebra (HCC) 05/03/2016   S/P lumbar fusion 05/03/2016   Endometrial cancer (HCC)    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   Malignant neoplasm of corpus uteri, except isthmus (HCC) 07/23/2011    Orientation RESPIRATION BLADDER Height & Weight     Self, Time, Situation, Place  Normal Continent Weight: 162 lb (73.5 kg) Height:  5\' 4"  (162.6 cm)  BEHAVIORAL SYMPTOMS/MOOD NEUROLOGICAL BOWEL NUTRITION STATUS      Continent Diet (heart healthy with thin liquids)  AMBULATORY STATUS COMMUNICATION OF NEEDS Skin    Limited Assist Verbally Bruising (to chest)                       Personal Care Assistance Level of Assistance  Bathing, Feeding, Dressing Bathing Assistance: Maximum assistance Feeding assistance: Limited assistance Dressing Assistance: Maximum assistance     Functional Limitations Info  Sight, Hearing, Speech Sight Info: Impaired Hearing Info: Adequate Speech Info: Impaired (slur)    SPECIAL CARE FACTORS FREQUENCY  PT (By licensed PT), OT (By licensed OT)     PT Frequency: 5x/wk OT Frequency: 5x/wk            Contractures Contractures Info: Not present    Additional Factors Info  Code Status, Allergies Code Status Info: Full Allergies Info: NKA           Current Medications (12/19/2023):  This is the current hospital active medication list Current Facility-Administered Medications  Medication Dose Route Frequency Provider Last Rate Last Admin   acetaminophen (TYLENOL) tablet 650 mg  650 mg Oral Q6H PRN Synetta Fail, MD   650 mg at 12/19/23 0003   Or   acetaminophen (TYLENOL) suppository 650 mg  650 mg Rectal Q6H PRN Synetta Fail, MD       apixaban Everlene Balls) tablet 5 mg  5 mg Oral BID Azucena Fallen, MD   5 mg at 12/18/23 2242   lidocaine (LIDODERM) 5 % 1 patch  1 patch Transdermal Q24H Azucena Fallen, MD   1 patch at 12/18/23 1204   loperamide (IMODIUM) capsule 2 mg  2 mg Oral PRN Beola Cord  B, MD   2 mg at 12/19/23 0437   polyethylene glycol (MIRALAX / GLYCOLAX) packet 17 g  17 g Oral Daily PRN Melvin, Alexander B, MD       rosuvastatin (CRESTOR) tablet 20 mg  20 mg Oral Daily Melvin, Alexander B, MD   20 mg at 12/18/23 1007   sodium chloride flush (NS) 0.9 % injection 3 mL  3 mL Intravenous Q12H Johnetta Nab, MD   3 mL at 12/18/23 2245     Discharge Medications: Please see discharge summary for a list of discharge medications.  Relevant Imaging Results:  Relevant Lab Results:   Additional Information SSN:  [address] [place], [place]

## 2023-12-19 NOTE — TOC Progression Note (Addendum)
 Transition of Care [facility]) - Progression Note    Patient Details  Name: SAYURI RHAMES MRN: [id number] Date of Birth: 1941-01-22  [facility]) CM/SW Contact  Jonathan Neighbor, RN Phone Number: 12/19/2023, 11:22 AM  Clinical Narrative:     Pt provided choice from bed offers. She asked to attend [facility]. Sherrlyn Dolores with [facility] accepted. CM has asked TOC MOA to begin insurance auth.  TOC following.  1255: pt has received auth:   Expected Discharge Plan: Skilled Nursing Facility    Expected Discharge Plan and Services   4/17 - 4/21, next review date 4/21, Cesar Collins id [id number]  [facility] can accept her tomorrow. MD and pt updated.   Living arrangements for the past 2 months: Single Family Home                                       Social Determinants of Health (SDOH) Interventions SDOH Screenings   Food Insecurity: No Food Insecurity (12/17/2023)  Housing: Low Risk  (12/17/2023)  Transportation Needs: No Transportation Needs (12/17/2023)  Utilities: Not At Risk (12/17/2023)  Social Connections: Moderately Integrated (12/17/2023)  Tobacco Use: Low Risk  (12/16/2023)    Readmission Risk Interventions     No data to display

## 2023-12-19 NOTE — Progress Notes (Addendum)
 Pt has had episodes of vomiting blackish/brown emesis today; twice projectile.  She complains of "soreness and bloating" to her stomach when she pushes on it.  No appetite, eating only cereal for breakfast. Zofran given at 1405 with some relief and phenergan IV.  Pt vomited after administration of phenergan. MD notified of issues. Diet changed to full liquid and gastrocult card sent to lab. She is afebrile with no other complaints. A & O. VSS. One small incontinent BM today that was brown. Report given to night charge.

## 2023-12-19 NOTE — Progress Notes (Signed)
 PT Cancellation Note  Patient Details Name: [name] MRN: [id number] DOB: 08/19/1941   Cancelled Treatment:    Reason Eval/Treat Not Completed: Other (comment) (In early afternoon around 1 pm pt was nauseous and waiting on anti nausea medication. Returned at 3:15 and pt reports she does not feel well and would like to wait on physcial therapy. Will continue to follow up as able and appropriate.)  Sloan Duncans, DPT, CLT  Acute Rehabilitation Services Office: [phone] (Secure chat preferred)   Jenice Mitts 12/19/2023, 3:28 PM

## 2023-12-19 NOTE — Significant Event (Signed)
 I was notified by the patient's nurse that patient has been having persistent dark-colored vomitus since morning.  Also had some loose stools unable to keep in anything.  I reviewed patient's notes labs and medications.  Patient admitted for stroke has prior history of DVT on Eliquis.  On exam at bedside patient appears uncomfortable. Blood pressure is 160/70 pulse 80/min temperature 98.4 respiration 18/min HEENT anicteric no pallor. Chest bilateral air entry present. Abdomen soft mildly distended no guarding or rigidity. Heart S1-S2 heard. Neuro alert awake oriented  Assessment and plan -     I ordered a stat KUB which shows -   Marked gaseous distension of the stomach with pneumatosis involving the gastric fundus and lateral aspect of the proximal body of the stomach. Gastric ischemia, while unusual, could appear in this fashion. Further evaluation with contrast enhanced CT imaging is recommended.Developing ileus.  I discussed with on-call general surgeon Dr. Leighton Punches who advised patient to be placed on NG tube and get a CT scan abdomen pelvis.  Will keep patient n.p.o. hold anticoagulants for now and keep patient on antibiotics and fluids.  I also ordered stat CBC metabolic panel lactic acid LFTs and lipase.  Will await further recommendation from general surgery.  [name].

## 2023-12-19 NOTE — Progress Notes (Signed)
 PROGRESS NOTE    [name]  ZOX:[id number] DOB: 1941/03/09 DOA: 12/16/2023 PCP: Charlane Ferretti, DO   Brief Narrative:  [name] is a 83 y.o. female with medical history significant of hypertension, hyperlipidemia, GERD, uterine cancer, PE, DVT presenting with focal neurologic deficits.   Patient remains medically stable for discharge, awaiting SNF authorization and bed approval given recommendations by PT OT  Assessment & Plan:   Principal Problem:   Focal neurological deficit Active Problems:   Malignant neoplasm of corpus uteri, except isthmus (HCC)   Right leg DVT (HCC)   Hx of pulmonary embolus   Essential hypertension   Mixed hyperlipidemia   Gastroesophageal reflux disease without esophagitis   Acute CVA, acute punctate bilateral parietal cortical infarcts, POA Dysphagia -Facial droop and slurred speech at intake, improving - Image confirms acute punctate infarcts, neurology consulted -PT OT speech per protocol - Already on full dose anticoagulation as below with Eliquis although on 2.5mg  BID for unclear reason - increase eliquis to 5mg  BID per discussion with neuro and pharm -Echo: EF 60-65%; grade 1 diastolic dysfunction, without obvious shunt   Diarrhea, resolved Leukocytosis, reactive -Unspecified, resolved; Cdiff negative -No indication for antibiotics at this time -If recurrent loose stool will send for sample testing -White count downtrending appropriately, likely reactive  Syncope, likely vagal Orthostatic hypotension  -Vitals today with PT OT notable for drop in blood pressure from supine to sitting as well as profound tachycardia with sitting to stand. - Restart IV fluid overnight, encourage p.o. intake and reevaluate in the next 24 hours - Echocardiogram with EF 60 to 65% normal LV function with grade 1 diastolic dysfunction, without notable shunt   Hypokalemia  - Secondary to diarrhea, within normal limits  Hypertension, currently  hypotensive - Holding propranolol in the setting of bradycardia and permissive hypertension - Holding Lasix   Hyperlipidemia - Continue rosuvastatin   History of PE and DVT - Continue Eliquis   History of uterine cancer - Noted   DVT prophylaxis:  apixaban (ELIQUIS) tablet 5 mg  Code Status:   Code Status: Full Code Family Communication: None present  Status is: Inpatient  Dispo: The patient is from: Home              Anticipated d/c is to: To be determined              Anticipated d/c date is: 24 to 48 hours              Patient currently not medically stable for discharge  Consultants:  Neurology  Procedures:  None  Antimicrobials:  None  Subjective: No acute issues or events overnight denies nausea vomiting diarrhea constipation any fever chills -Notes ongoing pleuritic chest pain today with deep inspiration or palpation of the anterior chest wall.  Objective: Vitals:   12/18/23 1526 12/18/23 2102 12/19/23 0002 12/19/23 0440  BP: 139/78 (!) 149/71 (!) 164/68 (!) 176/75  Pulse: 89 90 93 79  Resp: 14 18 18    Temp: 98.2 F (36.8 C) 97.6 F (36.4 C) 99.2 F (37.3 C) 97.6 F (36.4 C)  TempSrc: Oral Oral Oral Oral  SpO2: 98% 95% 94% 95%  Weight:      Height:        Intake/Output Summary (Last 24 hours) at 12/19/2023 0717 Last data filed at 12/18/2023 1400 Gross per 24 hour  Intake 480 ml  Output --  Net 480 ml   Filed Weights   12/17/23 0416  Weight: 73.5 kg  Examination:  General:  Pleasantly resting in bed, No acute distress. HEENT:  Normocephalic atraumatic.  Sclerae nonicteric, noninjected.  Extraocular movements intact bilaterally. Neck:  Without mass or deformity.  Trachea is midline. Lungs:  Clear to auscultate bilaterally without rhonchi, wheeze, or rales. Heart:  Regular rate and rhythm.  Without murmurs, rubs, or gallops. Abdomen:  Soft, nontender, nondistended.  Without guarding or rebound. Extremities: Without cyanosis, clubbing,  edema, or obvious deformity. Skin:  Warm and dry, no erythema.  Data Reviewed: I have personally reviewed following labs and imaging studies  CBC: Recent Labs  Lab 12/16/23 1317 12/16/23 1322 12/17/23 0519 12/18/23 0606  WBC 18.9*  --  15.6* 14.6*  NEUTROABS 16.8*  --   --   --   HGB 13.2 13.9 12.7 12.6  HCT 40.0 41.0 37.0 37.8  MCV 98.0  --  93.7 96.2  PLT 187  --  176 151   Basic Metabolic Panel: Recent Labs  Lab 12/16/23 1317 12/16/23 1322 12/17/23 0519 12/18/23 0606  NA 140 137 139 137  K 3.7 3.6 3.0* 4.0  CL 104 102 98 103  CO2 24  --  29 24  GLUCOSE 175* 173* 128* 115*  BUN 14 14 11 10   CREATININE 0.84 0.80 0.95 0.57  CALCIUM 8.7*  --  8.4* 8.5*   GFR: Estimated Creatinine Clearance: 53.2 mL/min (by C-G formula based on SCr of 0.57 mg/dL). Liver Function Tests: Recent Labs  Lab 12/16/23 1317 12/17/23 0519  AST 32 32  ALT 21 19  ALKPHOS 18* 20*  BILITOT 0.7 1.2  PROT 6.4* 6.2*  ALBUMIN 3.2* 3.0*    Coagulation Profile: Recent Labs  Lab 12/16/23 1317  INR 1.0   CBG: Recent Labs  Lab 12/16/23 1315  GLUCAP 169*   Recent Results (from the past 240 hours)  C Difficile Quick Screen w PCR reflex     Status: None   Collection Time: 12/18/23  8:20 PM   Specimen: STOOL  Result Value Ref Range Status   C Diff antigen NEGATIVE NEGATIVE Final   C Diff toxin NEGATIVE NEGATIVE Final   C Diff interpretation No C. difficile detected.  Final    Comment: Performed at [facility], [address], [place], [place] [address]         Radiology Studies: ECHOCARDIOGRAM COMPLETE BUBBLE STUDY Result Date: 12/17/2023    ECHOCARDIOGRAM REPORT   Patient Name:   [name] Date of Exam: 12/17/2023 Medical Rec #:  [id number]      Height:       64.0 in Accession #:    [id number]     Weight:       162.0 lb Date of Birth:  [birth year]      BSA:          1.789 m Patient Age:    83 years       BP:           140/51 mmHg Patient Gender: F              HR:            66 bpm. Exam Location:  Inpatient Procedure: 2D Echo (Both Spectral and Color Flow Doppler were utilized during            procedure). Indications:    syncope  History:        Patient has no prior history of Echocardiogram examinations.  Risk Factors:Hypertension and Dyslipidemia.  Sonographer:    Delcie Roch RDCS Referring Phys: [id number] Cecille Po MELVIN IMPRESSIONS  1. Left ventricular ejection fraction, by estimation, is 60 to 65%. The left ventricle has normal function. The left ventricle has no regional wall motion abnormalities. Left ventricular diastolic parameters are consistent with Grade I diastolic dysfunction (impaired relaxation).  2. Right ventricular systolic function is normal. The right ventricular size is normal. There is mildly elevated pulmonary artery systolic pressure. The estimated right ventricular systolic pressure is 37.8 mmHg.  3. The mitral valve is normal in structure. Trivial mitral valve regurgitation. No evidence of mitral stenosis.  4. The aortic valve is normal in structure. Aortic valve regurgitation is not visualized. No aortic stenosis is present.  5. The inferior vena cava is normal in size with greater than 50% respiratory variability, suggesting right atrial pressure of 3 mmHg.  6. Agitated saline contrast bubble study was negative, with no evidence of any interatrial shunt. FINDINGS  Left Ventricle: Left ventricular ejection fraction, by estimation, is 60 to 65%. The left ventricle has normal function. The left ventricle has no regional wall motion abnormalities. The left ventricular internal cavity size was normal in size. There is  no left ventricular hypertrophy. Left ventricular diastolic parameters are consistent with Grade I diastolic dysfunction (impaired relaxation). Right Ventricle: The right ventricular size is normal. No increase in right ventricular wall thickness. Right ventricular systolic function is normal. There is mildly elevated  pulmonary artery systolic pressure. The tricuspid regurgitant velocity is 2.95  m/s, and with an assumed right atrial pressure of 3 mmHg, the estimated right ventricular systolic pressure is 37.8 mmHg. Left Atrium: Left atrial size was normal in size. Right Atrium: Right atrial size was normal in size. Pericardium: There is no evidence of pericardial effusion. Mitral Valve: The mitral valve is normal in structure. Trivial mitral valve regurgitation. No evidence of mitral valve stenosis. Tricuspid Valve: The tricuspid valve is normal in structure. Tricuspid valve regurgitation is trivial. No evidence of tricuspid stenosis. Aortic Valve: The aortic valve is normal in structure. Aortic valve regurgitation is not visualized. No aortic stenosis is present. Pulmonic Valve: The pulmonic valve was normal in structure. Pulmonic valve regurgitation is not visualized. No evidence of pulmonic stenosis. Aorta: The aortic root is normal in size and structure. Venous: The inferior vena cava is normal in size with greater than 50% respiratory variability, suggesting right atrial pressure of 3 mmHg. IAS/Shunts: No atrial level shunt detected by color flow Doppler. Agitated saline contrast was given intravenously to evaluate for intracardiac shunting. Agitated saline contrast bubble study was negative, with no evidence of any interatrial shunt.  LEFT VENTRICLE PLAX 2D LVIDd:         3.60 cm   Diastology LVIDs:         2.90 cm   LV e' medial:    9.36 cm/s LV PW:         1.10 cm   LV E/e' medial:  12.0 LV IVS:        1.10 cm   LV e' lateral:   9.90 cm/s LVOT diam:     2.00 cm   LV E/e' lateral: 11.3 LV SV:         112 LV SV Index:   62 LVOT Area:     3.14 cm  RIGHT VENTRICLE RV Basal diam:  2.20 cm RV S prime:     12.20 cm/s TAPSE (M-mode): 2.4 cm LEFT ATRIUM  Index        RIGHT ATRIUM           Index LA diam:        3.20 cm 1.79 cm/m   RA Area:     11.20 cm LA Vol (A2C):   32.1 ml 17.95 ml/m  RA Volume:   23.00 ml   12.86 ml/m LA Vol (A4C):   26.9 ml 15.04 ml/m LA Biplane Vol: 30.3 ml 16.94 ml/m  AORTIC VALVE LVOT Vmax:   175.00 cm/s LVOT Vmean:  108.000 cm/s LVOT VTI:    0.355 m  AORTA Ao Root diam: 3.00 cm Ao Asc diam:  3.50 cm MITRAL VALVE                TRICUSPID VALVE MV Area (PHT): 3.65 cm     TR Peak grad:   34.8 mmHg MV Decel Time: 208 msec     TR Vmax:        295.00 cm/s MV E velocity: 112.00 cm/s MV A velocity: 118.00 cm/s  SHUNTS MV E/A ratio:  0.95         Systemic VTI:  0.36 m                             Systemic Diam: 2.00 cm Dorothye Gathers MD Electronically signed by Dorothye Gathers MD Signature Date/Time: 12/17/2023/9:45:15 PM    Final    CT ANGIO HEAD NECK W WO CM Result Date: 12/17/2023 CLINICAL DATA:  Stroke/TIA, determine embolic source EXAM: CT ANGIOGRAPHY HEAD AND NECK WITH AND WITHOUT CONTRAST TECHNIQUE: Multidetector CT imaging of the head and neck was performed using the standard protocol during bolus administration of intravenous contrast. Multiplanar CT image reconstructions and MIPs were obtained to evaluate the vascular anatomy. Carotid stenosis measurements (when applicable) are obtained utilizing NASCET criteria, using the distal internal carotid diameter as the denominator. RADIATION DOSE REDUCTION: This exam was performed according to the departmental dose-optimization program which includes automated exposure control, adjustment of the mA and/or kV according to patient size and/or use of iterative reconstruction technique. CONTRAST:  75mL OMNIPAQUE IOHEXOL 350 MG/ML SOLN COMPARISON:  MRI head and CTA head April 14, 25. FINDINGS: CT HEAD FINDINGS Brain: Punctate infarcts better seen on recent MRI. No acute hemorrhage, hydrocephalus, extra-axial collection or mass lesion/mass effect. Patchy white matter hypodensities, nonspecific but compatible with chronic microvascular ischemic disease. Vascular: See below. Skull: No acute fracture. Sinuses/Orbits: No acute findings. Review of the MIP images  confirms the above findings CTA NECK FINDINGS Aortic arch: Incompletely imaged. The visualized great vessel origins are patent. Right carotid system: No evidence of dissection, stenosis (50% or greater), or occlusion. Left carotid system: No evidence of dissection, stenosis (50% or greater), or occlusion. Vertebral arteries: Codominant. No evidence of dissection, stenosis (50% or greater), or occlusion. Mild irregularity the distal right V2 vertebral artery. Skeleton: No evidence of acute fracture. Multilevel degenerative change. Other neck: No evidence of acute abnormality on limited assessment. Upper chest: Visualized lung apices are clear. Review of the MIP images confirms the above findings CTA HEAD FINDINGS Anterior circulation: Bilateral intracranial ICAs, MCAs, and ACAs are patent without proximal hemodynamically significant stenosis. Posterior circulation: Bilateral intradural vertebral arteries, basilar artery and bilateral posterior cerebral arteries are patent without proximal hemodynamically significant stenosis. Venous sinuses: As permitted by contrast timing, patent. Review of the MIP images confirms the above findings IMPRESSION: No large vessel occlusion or proximal hemodynamically significant stenosis. Electronically  Signed   By: Stevenson Elbe M.D.   On: 12/17/2023 19:25   Scheduled Meds:  apixaban  5 mg Oral BID   lidocaine  1 patch Transdermal Q24H   rosuvastatin  20 mg Oral Daily   sodium chloride flush  3 mL Intravenous Q12H   Continuous Infusions:     LOS: 0 days   Time spent:  Haydee Lipa, DO [facility]  If 7PM-7AM, please contact night-coverage [email or web address]  12/19/2023, 7:17 AM

## 2023-12-20 ENCOUNTER — Observation Stay (HOSPITAL_COMMUNITY)

## 2023-12-20 DIAGNOSIS — I119 Hypertensive heart disease without heart failure: Secondary | ICD-10-CM | POA: Diagnosis present

## 2023-12-20 DIAGNOSIS — E860 Dehydration: Secondary | ICD-10-CM | POA: Diagnosis present

## 2023-12-20 DIAGNOSIS — Z90710 Acquired absence of both cervix and uterus: Secondary | ICD-10-CM | POA: Diagnosis not present

## 2023-12-20 DIAGNOSIS — R001 Bradycardia, unspecified: Secondary | ICD-10-CM | POA: Diagnosis present

## 2023-12-20 DIAGNOSIS — R0789 Other chest pain: Secondary | ICD-10-CM | POA: Diagnosis present

## 2023-12-20 DIAGNOSIS — R29810 Facial weakness: Secondary | ICD-10-CM | POA: Diagnosis present

## 2023-12-20 DIAGNOSIS — K560 Paralytic ileus: Secondary | ICD-10-CM | POA: Diagnosis not present

## 2023-12-20 DIAGNOSIS — K219 Gastro-esophageal reflux disease without esophagitis: Secondary | ICD-10-CM | POA: Diagnosis present

## 2023-12-20 DIAGNOSIS — E872 Acidosis, unspecified: Secondary | ICD-10-CM | POA: Diagnosis not present

## 2023-12-20 DIAGNOSIS — R079 Chest pain, unspecified: Secondary | ICD-10-CM | POA: Diagnosis present

## 2023-12-20 DIAGNOSIS — R1310 Dysphagia, unspecified: Secondary | ICD-10-CM | POA: Diagnosis present

## 2023-12-20 DIAGNOSIS — I951 Orthostatic hypotension: Secondary | ICD-10-CM | POA: Diagnosis present

## 2023-12-20 DIAGNOSIS — R471 Dysarthria and anarthria: Secondary | ICD-10-CM | POA: Diagnosis present

## 2023-12-20 DIAGNOSIS — E782 Mixed hyperlipidemia: Secondary | ICD-10-CM | POA: Diagnosis present

## 2023-12-20 DIAGNOSIS — R4781 Slurred speech: Secondary | ICD-10-CM | POA: Diagnosis present

## 2023-12-20 DIAGNOSIS — R29702 NIHSS score 2: Secondary | ICD-10-CM | POA: Diagnosis present

## 2023-12-20 DIAGNOSIS — Z8542 Personal history of malignant neoplasm of other parts of uterus: Secondary | ICD-10-CM | POA: Diagnosis not present

## 2023-12-20 DIAGNOSIS — R739 Hyperglycemia, unspecified: Secondary | ICD-10-CM | POA: Diagnosis present

## 2023-12-20 DIAGNOSIS — W19XXXA Unspecified fall, initial encounter: Secondary | ICD-10-CM | POA: Diagnosis not present

## 2023-12-20 DIAGNOSIS — I6340 Cerebral infarction due to embolism of unspecified cerebral artery: Secondary | ICD-10-CM | POA: Diagnosis present

## 2023-12-20 DIAGNOSIS — Z7901 Long term (current) use of anticoagulants: Secondary | ICD-10-CM | POA: Diagnosis not present

## 2023-12-20 DIAGNOSIS — R29818 Other symptoms and signs involving the nervous system: Secondary | ICD-10-CM | POA: Diagnosis not present

## 2023-12-20 DIAGNOSIS — R000 Tachycardia, unspecified: Secondary | ICD-10-CM | POA: Diagnosis not present

## 2023-12-20 DIAGNOSIS — D72829 Elevated white blood cell count, unspecified: Secondary | ICD-10-CM | POA: Diagnosis present

## 2023-12-20 DIAGNOSIS — E876 Hypokalemia: Secondary | ICD-10-CM | POA: Diagnosis not present

## 2023-12-20 DIAGNOSIS — I639 Cerebral infarction, unspecified: Secondary | ICD-10-CM | POA: Diagnosis not present

## 2023-12-20 DIAGNOSIS — K3189 Other diseases of stomach and duodenum: Secondary | ICD-10-CM | POA: Diagnosis not present

## 2023-12-20 LAB — CBC
HCT: 41.3 % (ref 36.0–46.0)
Hemoglobin: 13.5 g/dL (ref 12.0–15.0)
MCH: 31.8 pg (ref 26.0–34.0)
MCHC: 32.7 g/dL (ref 30.0–36.0)
MCV: 97.4 fL (ref 80.0–100.0)
Platelets: 186 10*3/uL (ref 150–400)
RBC: 4.24 MIL/uL (ref 3.87–5.11)
RDW: 14.2 % (ref 11.5–15.5)
WBC: 20.6 10*3/uL — ABNORMAL HIGH (ref 4.0–10.5)
nRBC: 0.1 % (ref 0.0–0.2)

## 2023-12-20 LAB — TYPE AND SCREEN
ABO/RH(D): A POS
Antibody Screen: NEGATIVE

## 2023-12-20 LAB — LIPASE, BLOOD: Lipase: 28 U/L (ref 11–51)

## 2023-12-20 LAB — GLUCOSE, CAPILLARY
Glucose-Capillary: 118 mg/dL — ABNORMAL HIGH (ref 70–99)
Glucose-Capillary: 118 mg/dL — ABNORMAL HIGH (ref 70–99)
Glucose-Capillary: 122 mg/dL — ABNORMAL HIGH (ref 70–99)
Glucose-Capillary: 135 mg/dL — ABNORMAL HIGH (ref 70–99)
Glucose-Capillary: 202 mg/dL — ABNORMAL HIGH (ref 70–99)

## 2023-12-20 LAB — LACTIC ACID, PLASMA
Lactic Acid, Venous: 2 mmol/L (ref 0.5–1.9)
Lactic Acid, Venous: 3.2 mmol/L (ref 0.5–1.9)

## 2023-12-20 LAB — HEPATIC FUNCTION PANEL
ALT: 16 U/L (ref 0–44)
AST: 28 U/L (ref 15–41)
Albumin: 2.5 g/dL — ABNORMAL LOW (ref 3.5–5.0)
Alkaline Phosphatase: 19 U/L — ABNORMAL LOW (ref 38–126)
Bilirubin, Direct: 0.2 mg/dL (ref 0.0–0.2)
Indirect Bilirubin: 0.6 mg/dL (ref 0.3–0.9)
Total Bilirubin: 0.8 mg/dL (ref 0.0–1.2)
Total Protein: 6.4 g/dL — ABNORMAL LOW (ref 6.5–8.1)

## 2023-12-20 LAB — BASIC METABOLIC PANEL WITH GFR
Anion gap: 10 (ref 5–15)
BUN: 18 mg/dL (ref 8–23)
CO2: 28 mmol/L (ref 22–32)
Calcium: 8.5 mg/dL — ABNORMAL LOW (ref 8.9–10.3)
Chloride: 99 mmol/L (ref 98–111)
Creatinine, Ser: 0.98 mg/dL (ref 0.44–1.00)
GFR, Estimated: 58 mL/min — ABNORMAL LOW (ref >60)
Glucose, Bld: 206 mg/dL — ABNORMAL HIGH (ref 70–99)
Potassium: 3.6 mmol/L (ref 3.5–5.1)
Sodium: 137 mmol/L (ref 135–145)

## 2023-12-20 MED ORDER — IOHEXOL 350 MG/ML SOLN
75.0000 mL | Freq: Once | INTRAVENOUS | Status: AC | PRN
  Administered 2023-12-20: 75 mL via INTRAVENOUS

## 2023-12-20 MED ORDER — LACTATED RINGERS IV BOLUS
1000.0000 mL | Freq: Once | INTRAVENOUS | Status: AC
  Administered 2023-12-20: 1000 mL via INTRAVENOUS

## 2023-12-20 MED ORDER — SODIUM CHLORIDE 0.9 % IV SOLN
1.0000 g | INTRAVENOUS | Status: DC
  Administered 2023-12-20 – 2023-12-22 (×3): 1 g via INTRAVENOUS
  Filled 2023-12-20 (×3): qty 10

## 2023-12-20 MED ORDER — METRONIDAZOLE 500 MG/100ML IV SOLN
500.0000 mg | Freq: Two times a day (BID) | INTRAVENOUS | Status: DC
  Administered 2023-12-20 – 2023-12-23 (×7): 500 mg via INTRAVENOUS
  Filled 2023-12-20 (×7): qty 100

## 2023-12-20 MED ORDER — LACTATED RINGERS IV SOLN: INTRAVENOUS | Status: AC

## 2023-12-20 MED ORDER — ACETAMINOPHEN 10 MG/ML IV SOLN
1000.0000 mg | Freq: Four times a day (QID) | INTRAVENOUS | Status: AC | PRN
  Administered 2023-12-20: 1000 mg via INTRAVENOUS
  Filled 2023-12-20: qty 100

## 2023-12-20 NOTE — Consult Note (Signed)
 [name] 04-08-1941  [id number].    Requesting MD: Dr. Melford Spotted Chief Complaint/Reason for Consult: gastric pneumatosis  HPI:  [name] is an 83 yo female who was admitted with facial droop and slurred speech, and was found to have acute punctate infarcts. Neuro deficits subsequently resolved and she is on medical management. She was already on Eliquis  for a history of DVT/PE. Today she had several episodes of dark emesis. This evening she has reported abdominal bloating. A KUB was obtained, which showed gastric distension with gastric pneumatosis. General surgery was consulted. An NG tube has been placed and CT scan is pending. Hgb is stable at 14, and WBC is 18 (it has been elevated since admission, ranging from 14-18). Lactic acid is 3.2. She now says that she is feeling much better after NG placement. Abdominal pain is much better.  Her only prior abdominal surgery is a hysterectomy for endometrial cancer.  ROS: Review of Systems  Constitutional:  Negative for chills and fever.    Family History  Problem Relation Age of Onset   Heart failure Mother    Ulcers Father     Past Medical History:  Diagnosis Date   Anxiety    Arthritis    DVT (deep venous thrombosis) (HCC)    Endometrial cancer (HCC) dx'd 01/2008   recurrences 08/2011; 01/2012; xrt comp 03/2012   GERD (gastroesophageal reflux disease)    Hyperlipidemia    Hypertension    Migraines    Radiation 10/2011   External Beam/Intracavitary   S/P radiation therapy 01/29/12 - 03/10/12   Periaortic area/ 5600 cgy/ 28 Fractions    Past Surgical History:  Procedure Laterality Date   ANTERIOR LATERAL LUMBAR FUSION 4 LEVELS Left 05/03/2016   Procedure: Lumbar three Corpectomy  with Lumbar one to lumbar five fixation fusion with percutaneous pedicle screws: left hemilaminectomy open reduction fracture fragment;  Surgeon: Raelene Bullocks Ditty, MD;  Location: [facility];  Service: Neurosurgery;  Laterality: Left;    CHOLECYSTECTOMY     EYE SURGERY     LUMBAR PERCUTANEOUS PEDICLE SCREW 4 LEVEL Bilateral 05/03/2016   Procedure: LUMBAR PERCUTANEOUS PEDICLE SCREW 4 LEVEL;  Surgeon: Raelene Bullocks Ditty, MD;  Location: [facility];  Service: Neurosurgery;  Laterality: Bilateral;   TOTAL ABDOMINAL HYSTERECTOMY W/ BILATERAL SALPINGOOPHORECTOMY  2009    Social History:  reports that she has never smoked. She has never used smokeless tobacco. She reports that she does not drink alcohol and does not use drugs.  Allergies: No Known Allergies  Medications Prior to Admission  Medication Sig Dispense Refill   acetaminophen  (TYLENOL ) 500 MG tablet Take 500-1,000 mg by mouth every 8 (eight) hours as needed for mild pain or moderate pain.     Calcium  Carbonate (CALTRATE 600 PO) Take 1 tablet by mouth in the morning and at bedtime.     cholecalciferol  (VITAMIN D ) 1000 UNITS tablet Take 1,000 Units by mouth daily.     CRESTOR  40 MG tablet 20 mg.      cyanocobalamin (VITAMIN B12) 1000 MCG tablet Take 1,000 mcg by mouth daily.     denosumab  (PROLIA ) 60 MG/ML SOSY injection Inject 60 mg into the skin every 6 (six) months.     ELIQUIS  2.5 MG TABS tablet TAKE 1 TABLET BY MOUTH TWICE  DAILY 180 tablet 3   ferrous sulfate 325 (65 FE) MG EC tablet Take 325 mg by mouth 3 (three) times daily with meals.     furosemide  (LASIX ) 40 MG  tablet Take 1 tablet (40 mg total) by mouth as needed. (Patient taking differently: Take 40 mg by mouth daily.) 90 tablet 3   Multiple Vitamin (DAILY VALUE MULTIVITAMIN PO) Take 1 tablet by mouth daily.     potassium chloride  SA (KLOR-CON  M) 20 MEQ tablet Take 20 mEq by mouth 2 (two) times daily.     propranolol  ER (INDERAL  LA) 60 MG 24 hr capsule Take 60 mg by mouth daily.     psyllium (METAMUCIL) 58.6 % powder Take 1 packet by mouth daily as needed (constipation).     vitamin C  (ASCORBIC ACID ) 500 MG tablet Take 500 mg by mouth daily.       Physical Exam: Blood pressure 130/85, pulse (!) 130,  temperature 97.9 F (36.6 C), temperature source Axillary, resp. rate 19, height 5\' 4"  (1.626 m), weight 73.5 kg, SpO2 91%. General: resting comfortably, appears stated age, no apparent distress Neurological: alert and oriented, no focal deficits HEENT: normocephalic, atraumatic, NG tube in place draining dark blood-tinged fluid Respiratory: normal work of breathing on room air Abdomen: mildly distended but very soft and nontender to palpation. Well-healed lower midline surgical scar. Extremities: warm and well-perfused, no deformities, moving all extremities spontaneously Skin: warm and dry, no jaundice, no rashes or lesions       Assessment/Plan 83 yo female admitted after a CVA, now with acute abdominal pain and vomiting for the last day. Imaging workup shows gastric distension with pneumatosis. Her CT scan confirms this, as well as portal venous gas and a small area of contained extraluminal air by the GE junction. Her symptoms have significantly improved with NG decompression and her abdominal exam is reassuring. There is no small bowel or colonic pneumatosis on CT. Gastric pneumatosis often resolves with adequate decompression, and progression to gastric necrosis is uncommon due to the robust blood supply to the stomach. However we will follow closely and plan for operative exploration if the patient clinically decompensates or develops signs of ischemia (rising lactate, fevers, worsening pain, etc.).  - NPO, NG decompression - IV fluid hydration - receiving 1L bolus and will begin maintenance fluids - Trend hgb/hct. Blood NG output may indicate some mucosal sloughing.  - IV antibiotics (Zosyn  started) - Agree with holding anticoagulation - Serial abdominal exams - Anticipate lactate will improve with adequate hydration and NG decompression - Repeat abdominal XR in am - Plan for surgery if patient worsens or does not improve with above measures.   Karleen Overall, MD [facility]  Surgery General, Hepatobiliary and Pancreatic Surgery 12/20/23 1:07 AM

## 2023-12-20 NOTE — Progress Notes (Signed)
 Date and time results received: 12/20/23 0035   Test: Lactic Acid Critical Value: 3.2  Name of Provider Notified: Dr. [facility] Lavender  Orders Received? Or Actions Taken?: New Orders

## 2023-12-20 NOTE — Progress Notes (Addendum)
 Seen by Dr. Leighton Punches early this morning, I saw her this morning around 0930  Patient feels better with NGT in place. Abdominal pain improved. +BM.  NG with ~700  cc blood tinged fluid. Abdomen soft and non-tender CCS will follow, no emergent surgical needs. Continue NPO NG to LIWS.    Michial Akin, PA-C [facility] Please see Amion for pager number during day hours 7:00am-4:30pm

## 2023-12-20 NOTE — Progress Notes (Signed)
 PROGRESS NOTE    [name]  FMW:[id number] DOB: 09-Jan-1941 DOA: 12/16/2023 PCP: Valentin Skates, DO   Brief Narrative:  [name] is a 83 y.o. female with medical history significant of hypertension, hyperlipidemia, GERD, uterine cancer, PE, DVT presenting with focal neurologic deficits.   Patient remains medically stable for discharge, awaiting SNF authorization and bed approval given recommendations by PT OT  Assessment & Plan:   Principal Problem:   Focal neurological deficit Active Problems:   Malignant neoplasm of corpus uteri, except isthmus (HCC)   Right leg DVT (HCC)   Hx of pulmonary embolus   Essential hypertension   Mixed hyperlipidemia   Gastroesophageal reflux disease without esophagitis   Acute onset ileus Intractable nausea vomiting, improving Rule out GI bleed Surgery called overnight NG to decompress Gastroccult positive, H&H remained stable however -follow repeat, no indication for endoscopy or intervention at this time, hold off on GI consult De-escalate antibiotics ceftriaxone , Flagyl  - without clear over etiology but ongoing leukocytosis  Lactic acidosis  - Likely secondary to above with notable dehydration and volume depletion, improving with IV fluids  Acute CVA, acute punctate bilateral parietal cortical infarcts, POA Dysphagia -Facial droop and slurred speech at intake, improving - Image confirms acute punctate infarcts, neurology consulted -PT OT speech per protocol -Hold Eliquis  -Echo: EF 60-65%; grade 1 diastolic dysfunction, without obvious shunt   Diarrhea, resolved Leukocytosis questionably reactive versus above -Unspecified, resolved; Cdiff negative - Leukocytosis elevated overnight with ileus as above  Syncope, likely vagal Orthostatic hypotension  - Improving, continue IV fluids - Echocardiogram with EF 60 to 65% normal LV function with grade 1 diastolic dysfunction, without notable shunt   Hypokalemia  - Secondary to  diarrhea, within normal limits  Hypertension, currently hypotensive - Holding propranolol  in the setting of bradycardia and permissive hypertension - Holding Lasix  in the setting of above, continue IV fluids   Hyperlipidemia - Continue rosuvastatin    History of PE and DVT - Eliquis  on hold as above, new dose 5 mg twice daily given discussion with pharmacy   History of uterine cancer - Noted   DVT prophylaxis:   Code Status:   Code Status: Full Code Family Communication: None present  Status is: Inpatient  Dispo: The patient is from: Home              Anticipated d/c is to: To be determined              Anticipated d/c date is: 24 to 48 hours              Patient currently not medically stable for discharge  Consultants:  Neurology  Procedures:  None  Antimicrobials:  None  Subjective: Worsening nausea vomiting and abdominal distention overnight, imaging remarkable for ileus, general surgery called in consult.  NG tube placed with improved symptoms.  Objective: Vitals:   12/19/23 1542 12/19/23 1933 12/19/23 2207 12/20/23 0437  BP: (!) 155/84 (!) 145/92 130/85 125/77  Pulse: (!) 108  (!) 130 (!) 124  Resp: 18 19 19 19   Temp: 98 F (36.7 C) 98.4 F (36.9 C) 97.9 F (36.6 C) 98.4 F (36.9 C)  TempSrc: Oral Oral Axillary Oral  SpO2: 91% 93% 91% 94%  Weight:      Height:        Intake/Output Summary (Last 24 hours) at 12/20/2023 0719 Last data filed at 12/20/2023 0500 Gross per 24 hour  Intake 1442.48 ml  Output 400 ml  Net 1042.48 ml  Filed Weights   12/17/23 0416  Weight: 73.5 kg    Examination:  General:  Pleasantly resting in bed, No acute distress. HEENT: NG tube noted with dark brown fluid. Neck:  Without mass or deformity.  Trachea is midline. Lungs:  Clear to auscultate bilaterally without rhonchi, wheeze, or rales. Heart:  Regular rate and rhythm.  Without murmurs, rubs, or gallops. Abdomen: Nondistended, without guarding or  rebound. Extremities: Without cyanosis, clubbing, edema, or obvious deformity. Skin:  Warm and dry, no erythema.  Data Reviewed: I have personally reviewed following labs and imaging studies  CBC: Recent Labs  Lab 12/16/23 1317 12/16/23 1322 12/17/23 0519 12/18/23 0606 12/19/23 2314  WBC 18.9*  --  15.6* 14.6* 18.1*  NEUTROABS 16.8*  --   --   --   --   HGB 13.2 13.9 12.7 12.6 14.3  HCT 40.0 41.0 37.0 37.8 42.5  MCV 98.0  --  93.7 96.2 94.9  PLT 187  --  176 151 217   Basic Metabolic Panel: Recent Labs  Lab 12/16/23 1317 12/16/23 1322 12/17/23 0519 12/18/23 0606 12/19/23 2314  NA 140 137 139 137 137  K 3.7 3.6 3.0* 4.0 3.6  CL 104 102 98 103 99  CO2 24  --  29 24 28   GLUCOSE 175* 173* 128* 115* 206*  BUN 14 14 11 10 18   CREATININE 0.84 0.80 0.95 0.57 0.98  CALCIUM  8.7*  --  8.4* 8.5* 8.5*   GFR: Estimated Creatinine Clearance: 43.5 mL/min (by C-G formula based on SCr of 0.98 mg/dL). Liver Function Tests: Recent Labs  Lab 12/16/23 1317 12/17/23 0519 12/19/23 2314  AST 32 32 28  ALT 21 19 16   ALKPHOS 18* 20* 19*  BILITOT 0.7 1.2 0.8  PROT 6.4* 6.2* 6.4*  ALBUMIN  3.2* 3.0* 2.5*    Coagulation Profile: Recent Labs  Lab 12/16/23 1317  INR 1.0   CBG: Recent Labs  Lab 12/16/23 1315 12/20/23 0005 12/20/23 0638  GLUCAP 169* 202* 135*   Recent Results (from the past 240 hours)  C Difficile Quick Screen w PCR reflex     Status: None   Collection Time: 12/18/23  8:20 PM   Specimen: STOOL  Result Value Ref Range Status   C Diff antigen NEGATIVE NEGATIVE Final   C Diff toxin NEGATIVE NEGATIVE Final   C Diff interpretation No C. difficile detected.  Final    Comment: Performed at [facility], [address], [place], [place] [address]  Gastrointestinal Panel by PCR , Stool     Status: None   Collection Time: 12/18/23  8:20 PM   Specimen: Stool  Result Value Ref Range Status   Campylobacter species NOT DETECTED NOT DETECTED Final   Plesimonas  shigelloides NOT DETECTED NOT DETECTED Final   Salmonella species NOT DETECTED NOT DETECTED Final   Yersinia enterocolitica NOT DETECTED NOT DETECTED Final   Vibrio species NOT DETECTED NOT DETECTED Final   Vibrio cholerae NOT DETECTED NOT DETECTED Final   Enteroaggregative E coli (EAEC) NOT DETECTED NOT DETECTED Final   Enteropathogenic E coli (EPEC) NOT DETECTED NOT DETECTED Final   Enterotoxigenic E coli (ETEC) NOT DETECTED NOT DETECTED Final   Shiga like toxin producing E coli (STEC) NOT DETECTED NOT DETECTED Final   Shigella/Enteroinvasive E coli (EIEC) NOT DETECTED NOT DETECTED Final   Cryptosporidium NOT DETECTED NOT DETECTED Final   Cyclospora cayetanensis NOT DETECTED NOT DETECTED Final   Entamoeba histolytica NOT DETECTED NOT DETECTED Final   Giardia  lamblia NOT DETECTED NOT DETECTED Final   Adenovirus F40/41 NOT DETECTED NOT DETECTED Final   Astrovirus NOT DETECTED NOT DETECTED Final   Norovirus GI/GII NOT DETECTED NOT DETECTED Final   Rotavirus A NOT DETECTED NOT DETECTED Final   Sapovirus (I, II, IV, and V) NOT DETECTED NOT DETECTED Final    Comment: Performed at [facility], [address], [place], [place] [address]         Radiology Studies: CT ABDOMEN PELVIS W CONTRAST Result Date: 12/20/2023 CLINICAL DATA:  Nausea, vomiting EXAM: CT ABDOMEN AND PELVIS WITH CONTRAST TECHNIQUE: Multidetector CT imaging of the abdomen and pelvis was performed using the standard protocol following bolus administration of intravenous contrast. RADIATION DOSE REDUCTION: This exam was performed according to the departmental dose-optimization program which includes automated exposure control, adjustment of the mA and/or kV according to patient size and/or use of iterative reconstruction technique. CONTRAST:  75mL OMNIPAQUE  IOHEXOL  350 MG/ML SOLN COMPARISON:  10/04/2022. FINDINGS: Lower chest: Small bilateral pleural effusions. Dependent atelectasis in the lower lobes. Hepatobiliary:  Prior cholecystectomy. No focal liver lesion. There is portal venous gas throughout the liver. Pancreas: No focal abnormality or ductal dilatation. Spleen: No focal abnormality.  Normal size. Adrenals/Urinary Tract: No adrenal abnormality. No focal renal abnormality. No stones or hydronephrosis. Urinary bladder is unremarkable. Stomach/Bowel: NG tube tip is in the fundus of the stomach. Significant gaseous distention of the stomach. There is pneumatosis noted within the wall predominantly seen in the fundus and proximal body. There appears to be a small amount of extraluminal air medial to the stomach near the GE junction (image 23 series 3. There is mild distention of proximal and mid small bowel loops. Caliber change noted in the right lower pelvis. Distal small bowel loops are slightly decompressed relative to the other small bowel loops. Findings concerning for distal small bowel obstruction. Scattered colonic diverticula. There is a left lateral abdominal wall/flank hernia containing the descending colon. No bowel obstruction at this level. Vascular/Lymphatic: No evidence of aneurysm or adenopathy. Aortic atherosclerosis. Mesenteric vessels appear patent. Reproductive: Prior hysterectomy.  No adnexal masses. Other: Trace free fluid in the pelvis and adjacent to the liver. Musculoskeletal: No acute bony abnormality. IMPRESSION: Significant gaseous distention of the stomach with pneumatosis noted in the proximal stomach wall. There appears to be a small amount of contained extraluminal air medial to the proximal stomach in the region of the GE junction. Associated portal venous gas. Dilated proximal and mid small bowel with slightly decompressed distal small bowel. Findings concerning for distal small bowel obstruction. Aortic atherosclerosis.  Mesenteric vessels appear patent. NG tube in the proximal stomach. Small amount of free fluid. Small bilateral pleural effusions. Dependent atelectasis in the lower lobes.  Electronically Signed   By: Franky Crease M.D.   On: 12/20/2023 01:25   DG Abd Portable 1V Result Date: 12/20/2023 CLINICAL DATA:  NG tube placement EXAM: PORTABLE ABDOMEN - 1 VIEW COMPARISON:  12/19/2023 FINDINGS: NG tube tip is in the proximal stomach with the side port near the GE junction. Marked gaseous distention of the stomach. Pneumatosis again noted within the proximal gastric wall. IMPRESSION: NG tube tip in the proximal stomach with the side port near the GE junction. This could be advanced several cm for optimal positioning. Continued gaseous distention of the stomach with pneumatosis. Electronically Signed   By: Franky Crease M.D.   On: 12/20/2023 00:31   DG Abd 1 View Result Date: 12/19/2023 CLINICAL DATA:  Nausea, vomiting EXAM: ABDOMEN -  1 VIEW COMPARISON:  CT 10/04/2022, FINDINGS: There is marked gaseous distension of the stomach with pneumatosis involving the gastric fundus and lateral aspect of the proximal body of the stomach. Multiple prominent gas-filled loops of small bowel are present suggesting a developing ileus. Cholecystectomy clips are seen in the right upper quadrant. No gross free intraperitoneal gas. Small right pleural effusion. No acute bone abnormality. Lumbar fusion with instrumentation noted. IMPRESSION: 1. Marked gaseous distension of the stomach with pneumatosis involving the gastric fundus and lateral aspect of the proximal body of the stomach. Gastric ischemia, while unusual, could appear in this fashion. Further evaluation with contrast enhanced CT imaging is recommended. 2. Developing ileus. Electronically Signed   By: Dorethia Molt M.D.   On: 12/19/2023 23:10   Scheduled Meds:  lidocaine   1 patch Transdermal Q24H   rosuvastatin   20 mg Oral Daily   sodium chloride  flush  3 mL Intravenous Q12H   Continuous Infusions:  lactated ringers  125 mL/hr at 12/20/23 9661   piperacillin -tazobactam (ZOSYN )  IV 3.375 g (12/20/23 0700)   promethazine  (PHENERGAN ) injection  (IM or IVPB) 12.5 mg (12/19/23 1808)      LOS: 0 days   Time spent:  Julia JAYSON Montclair, DO [facility]  If 7PM-7AM, please contact night-coverage [email or web address]  12/20/2023, 7:19 AM

## 2023-12-20 NOTE — Progress Notes (Signed)
 Occupational Therapy Treatment Patient Details Name: [name] MRN: [id number] DOB: 08/11/1941 Today's Date: 12/20/2023   History of present illness 83 y.o. female presents to [facility] hospital on 12/16/2023 after a syncopal episode and fall. After the fall pt was noted to have slurred speech and facial droop. MRI notable for bilateral parietal infarcts. PMH includes HTN, HLD, GERD, uterine CA, PE, DVT.   OT comments  Pt. Seen for skilled OT treatment session.  Able to complete bed mobility with use of rail with S.  Step pivot to recliner CGA and cues for sequencing.  Pt. Reports feeling much better than yesterday and was eager to participate and requested oob to chair.  Agree with current d/c recommendations. Cont with acute OT POC.        If plan is discharge home, recommend the following:  A little help with walking and/or transfers;A little help with bathing/dressing/bathroom;Assistance with cooking/housework;Assist for transportation;Help with stairs or ramp for entrance   Equipment Recommendations  Tub/shower seat    Recommendations for Other Services      Precautions / Restrictions Precautions Precautions: Fall Precaution/Restrictions Comments: reports chest pain from fall but states its improved from initial pain Restrictions Other Position/Activity Restrictions: NG tube       Mobility Bed Mobility Overal bed mobility: Needs Assistance Bed Mobility: Supine to Sit     Supine to sit: Supervision, Used rails     General bed mobility comments: cues for use of bed rail vs. wanting to pull and hold onto therapist asst., able to scoot to eob and sit unsupported during prep for pivot to chair    Transfers Overall transfer level: Needs assistance Equipment used: None Transfers: Sit to/from Stand, Bed to chair/wheelchair/BSC Sit to Stand: Contact guard assist     Step pivot transfers: Contact guard assist           Balance                                            ADL either performed or assessed with clinical judgement   ADL Overall ADL's : Needs assistance/impaired                         Toilet Transfer: Contact guard assist;Cueing for sequencing;Stand-pivot Toilet Transfer Details (indicate cue type and reason): simulated with transfer from eob to recliner, cues for hand placement for pivot-management of NG tube and IV for pt. during transfer         Functional mobility during ADLs: Contact guard assist      Extremity/Trunk Assessment              Vision       Perception     Praxis     Communication Communication Communication: No apparent difficulties   Cognition Arousal: Alert Behavior During Therapy: WFL for tasks assessed/performed Cognition: No family/caregiver present to determine baseline                               Following commands: Intact        Cueing   Cueing Techniques: Verbal cues  Exercises      Shoulder Instructions       General Comments      Pertinent Vitals/ Pain       Pain Assessment Pain  Assessment: Faces Faces Pain Scale: Hurts little more Pain Location: chest Pain Descriptors / Indicators: Sore Pain Intervention(s): Limited activity within patient's tolerance, Monitored during session, Repositioned  Home Living                                          Prior Functioning/Environment              Frequency  Min 2X/week        Progress Toward Goals  OT Goals(current goals can now be found in the care plan section)  Progress towards OT goals: Progressing toward goals     Plan      Co-evaluation                 AM-PAC OT "6 Clicks" Daily Activity     Outcome Measure   Help from another person eating meals?: None Help from another person taking care of personal grooming?: A Little Help from another person toileting, which includes using toliet, bedpan, or urinal?: A Little Help from another person  bathing (including washing, rinsing, drying)?: A Lot Help from another person to put on and taking off regular upper body clothing?: A Little Help from another person to put on and taking off regular lower body clothing?: A Lot 6 Click Score: 17    End of Session    OT Visit Diagnosis: Unsteadiness on feet (R26.81);Muscle weakness (generalized) (M62.81);History of falling (Z91.81);Pain   Activity Tolerance Patient tolerated treatment well   Patient Left in chair;with call bell/phone within reach;with chair alarm set;with nursing/sitter in room   Nurse Communication Other (comment);Mobility status (alerted rn iv complete and beeping, she arrived at end of session to adjust. reviewed pivot for back to bed, call bell in reach. pt. able to state which button to press when ready for back to bed)        Time: 1053-1108 OT Time Calculation (min): 15 min  Charges: OT General Charges $OT Visit: 1 Visit OT Treatments $Self Care/Home Management : 8-22 mins  [name], COTA/L Acute Rehabilitation [phone]   [name]  12/20/2023, 11:25 AM

## 2023-12-20 NOTE — Progress Notes (Signed)
 Physical Therapy Treatment Patient Details Name: [name] MRN: [id number] DOB: 1941-04-27 Today's Date: 12/20/2023   History of Present Illness 83 y.o. female presents to [facility] hospital on 12/16/2023 after a syncopal episode and fall. After the fall pt was noted to have slurred speech and facial droop. MRI notable for bilateral parietal infarcts. PMH includes HTN, HLD, GERD, uterine CA, PE, DVT.    PT Comments  Pt is slowly progressing. Due to current medical condition pt progress is slowed. Pt is mod I for bed mobility and supervision for sit to stand/transfers without an AD at this time. Pt is limited with gait due to continuous suction through NGT. Pt will need assistance at home due to medical issues and has no assistance available 24/7. Due to pt current functional status, home set up and available assistance at home recommending skilled physical therapy services 3x/week in order to address strength, balance and functional mobility to decrease risk for falls, injury and re-hospitalization.       If plan is discharge home, recommend the following: A little help with walking and/or transfers;Assistance with cooking/housework;Assist for transportation;Help with stairs or ramp for entrance     Equipment Recommendations  None recommended by PT       Precautions / Restrictions Precautions Precautions: Fall Recall of Precautions/Restrictions: Intact Precaution/Restrictions Comments: reports chest pain from fall but states its improved from initial pain Restrictions Weight Bearing Restrictions Per Provider Order: No Other Position/Activity Restrictions: NG tube     Mobility  Bed Mobility Overal bed mobility: Modified Independent Bed Mobility: Supine to Sit, Sit to Supine     Supine to sit: Modified independent (Device/Increase time), Used rails Sit to supine: Modified independent (Device/Increase time), Used rails   General bed mobility comments: Pt uses bed rails to get to  supine<>sittig=ng    Transfers Overall transfer level: Needs assistance Equipment used: None Transfers: Sit to/from Stand, Bed to chair/wheelchair/BSC Sit to Stand: Supervision   Step pivot transfers: Supervision       General transfer comment: supervision for safety, slight increase in BOS    Ambulation/Gait     General Gait Details: limited today due to continuous suctioning through NG tube    Modified Rankin (Stroke Patients Only) Modified Rankin (Stroke Patients Only) Pre-Morbid Rankin Score: No symptoms Modified Rankin: Slight disability     Balance Overall balance assessment: Needs assistance Sitting-balance support: No upper extremity supported, Feet supported Sitting balance-Leahy Scale: Good     Standing balance support: No upper extremity supported, During functional activity Standing balance-Leahy Scale: Fair Standing balance comment: no overt LOB.      Communication Communication Communication: No apparent difficulties  Cognition Arousal: Alert Behavior During Therapy: WFL for tasks assessed/performed   PT - Cognitive impairments: No apparent impairments, No family/caregiver present to determine baseline       Following commands: Intact      Cueing Cueing Techniques: Verbal cues     General Comments General comments (skin integrity, edema, etc.): Pt vital signs stable throughout session. Pt reporting pain improved from last session though still sore in chest area. Reports abdomen and nausea are better from yesterday when PT was cancelled      Pertinent Vitals/Pain Pain Assessment Pain Assessment: Faces Faces Pain Scale: Hurts little more Pain Location: chest Pain Descriptors / Indicators: Sore Pain Intervention(s): Monitored during session, Limited activity within patient's tolerance           PT Goals (current goals can now be found in the care plan  section) Acute Rehab PT Goals Patient Stated Goal: to reduce pain at chest when  mobilizing PT Goal Formulation: With patient Time For Goal Achievement: 12/31/23 Potential to Achieve Goals: Good Progress towards PT goals: Progressing toward goals    Frequency    Min 3X/week      PT Plan  Continue with current POC        AM-PAC PT "6 Clicks" Mobility   Outcome Measure  Help needed turning from your back to your side while in a flat bed without using bedrails?: A Little Help needed moving from lying on your back to sitting on the side of a flat bed without using bedrails?: A Little Help needed moving to and from a bed to a chair (including a wheelchair)?: A Little Help needed standing up from a chair using your arms (e.g., wheelchair or bedside chair)?: A Little Help needed to walk in hospital room?: A Little Help needed climbing 3-5 steps with a railing? : A Little 6 Click Score: 18    End of Session Equipment Utilized During Treatment: Gait belt Activity Tolerance: Patient tolerated treatment well Patient left: in bed;with call bell/phone within reach;with bed alarm set;with family/visitor present Nurse Communication: Mobility status PT Visit Diagnosis: Other abnormalities of gait and mobility (R26.89);Muscle weakness (generalized) (M62.81);Pain Pain - part of body:  (chest at sternum rib attachments)     Time: 3875-6433 PT Time Calculation (min) (ACUTE ONLY): 27 min  Charges:    $Therapeutic Activity: 23-37 mins PT General Charges $$ ACUTE PT VISIT: 1 Visit                     Sloan Duncans, DPT, CLT  Acute Rehabilitation Services Office: [phone] (Secure chat preferred)    Jenice Mitts 12/20/2023, 3:49 PM

## 2023-12-21 ENCOUNTER — Inpatient Hospital Stay (HOSPITAL_COMMUNITY)

## 2023-12-21 DIAGNOSIS — R29818 Other symptoms and signs involving the nervous system: Secondary | ICD-10-CM | POA: Diagnosis not present

## 2023-12-21 LAB — GLUCOSE, CAPILLARY
Glucose-Capillary: 71 mg/dL (ref 70–99)
Glucose-Capillary: 86 mg/dL (ref 70–99)
Glucose-Capillary: 93 mg/dL (ref 70–99)
Glucose-Capillary: 94 mg/dL (ref 70–99)

## 2023-12-21 LAB — BASIC METABOLIC PANEL WITH GFR
Anion gap: 10 (ref 5–15)
BUN: 21 mg/dL (ref 8–23)
CO2: 26 mmol/L (ref 22–32)
Calcium: 7.6 mg/dL — ABNORMAL LOW (ref 8.9–10.3)
Chloride: 103 mmol/L (ref 98–111)
Creatinine, Ser: 0.72 mg/dL (ref 0.44–1.00)
GFR, Estimated: >60 mL/min (ref >60)
Glucose, Bld: 90 mg/dL (ref 70–99)
Potassium: 3.3 mmol/L — ABNORMAL LOW (ref 3.5–5.1)
Sodium: 139 mmol/L (ref 135–145)

## 2023-12-21 LAB — CBC
HCT: 32.8 % — ABNORMAL LOW (ref 36.0–46.0)
Hemoglobin: 11.1 g/dL — ABNORMAL LOW (ref 12.0–15.0)
MCH: 32.7 pg (ref 26.0–34.0)
MCHC: 33.8 g/dL (ref 30.0–36.0)
MCV: 96.8 fL (ref 80.0–100.0)
Platelets: 192 10*3/uL (ref 150–400)
RBC: 3.39 MIL/uL — ABNORMAL LOW (ref 3.87–5.11)
RDW: 14.4 % (ref 11.5–15.5)
WBC: 15.2 10*3/uL — ABNORMAL HIGH (ref 4.0–10.5)
nRBC: 0 % (ref 0.0–0.2)

## 2023-12-21 MED ORDER — LACTATED RINGERS IV SOLN: INTRAVENOUS | Status: AC

## 2023-12-21 MED ORDER — WHITE PETROLATUM EX OINT
TOPICAL_OINTMENT | CUTANEOUS | Status: DC | PRN
  Filled 2023-12-21: qty 28.35

## 2023-12-21 NOTE — Progress Notes (Signed)
 [facility] Progress Note     Subjective: CC:  NAEO. States her abdomen is less sore today. Patient reports flatus. Is unsure if she has had a BM.   Objective: Vital signs in last 24 hours: Temp:  [97.8 F (36.6 C)-99.4 F (37.4 C)] 97.8 F (36.6 C) (04/19 0719) Pulse Rate:  [70-110] 96 (04/19 0719) Resp:  [17-20] 19 (04/19 0719) BP: (109-133)/(49-82) 128/82 (04/19 0719) SpO2:  [91 %-96 %] 96 % (04/19 0719) Last BM Date : 12/19/23  Intake/Output from previous day: 04/18 0701 - 04/19 0700 In: -  Out: 2700 [Urine:850; Emesis/NG output:1850] Intake/Output this shift: No intake/output data recorded.  PE: Gen:  Alert, NAD, pleasant and cooperative Card:  Regular rate and rhythm, pedal pulses 2+ BL Pulm:  Normal effort ORA Abd: Soft, non-tender, non-distended, no guarding or rebound , well healed lower midline scar  NGT: 1850/24h, dark blood tinged effluent  Skin: warm and dry, no rashes  Psych: A&Ox3   Lab Results:  Recent Labs    12/20/23 0545 12/21/23 0614  WBC 20.6* 15.2*  HGB 13.5 11.1*  HCT 41.3 32.8*  PLT 186 192   BMET Recent Labs    12/19/23 2314 12/21/23 0614  NA 137 139  K 3.6 3.3*  CL 99 103  CO2 28 26  GLUCOSE 206* 90  BUN 18 21  CREATININE 0.98 0.72  CALCIUM  8.5* 7.6*   PT/INR No results for input(s): "LABPROT", "INR" in the last 72 hours. CMP     Component Value Date/Time   NA 139 12/21/2023 0614   NA 142 07/23/2022 0850   NA 140 02/28/2017 1310   K 3.3 (L) 12/21/2023 0614   K 4.3 02/28/2017 1310   CL 103 12/21/2023 0614   CO2 26 12/21/2023 0614   CO2 27 02/28/2017 1310   GLUCOSE 90 12/21/2023 0614   GLUCOSE 93 02/28/2017 1310   BUN 21 12/21/2023 0614   BUN 13 07/23/2022 0850   BUN 13.9 02/28/2017 1310   CREATININE 0.72 12/21/2023 0614   CREATININE 0.8 02/28/2017 1310   CALCIUM  7.6 (L) 12/21/2023 0614   CALCIUM  10.1 02/28/2017 1310   PROT 6.4 (L) 12/19/2023 2314   PROT 6.7 02/28/2017 1310   ALBUMIN  2.5 (L)  12/19/2023 2314   ALBUMIN  3.7 02/28/2017 1310   AST 28 12/19/2023 2314   AST 24 02/28/2017 1310   ALT 16 12/19/2023 2314   ALT 14 02/28/2017 1310   ALKPHOS 19 (L) 12/19/2023 2314   ALKPHOS 35 (L) 02/28/2017 1310   BILITOT 0.8 12/19/2023 2314   BILITOT 0.53 02/28/2017 1310   GFRNONAA >60 12/21/2023 0614   GFRAA >60 05/04/2016 0309   Lipase     Component Value Date/Time   LIPASE 28 12/19/2023 2314       Studies/Results: DG Abd 1 View Result Date: 12/20/2023 CLINICAL DATA:  Abdominal distension. EXAM: ABDOMEN - 1 VIEW COMPARISON:  12/20/2023, earlier same day FINDINGS: Interval decrease in marked distention of the stomach. Diffuse gaseous distention of small bowel and colon persists. Contrast material in the bladder is compatible with recent CT imaging. Bones are diffusely demineralized. IMPRESSION: 1. Interval decrease in marked distention of the stomach. 2. Persistent diffuse gaseous distention of small bowel and colon. Electronically Signed   By: Donnal Fusi M.D.   On: 12/20/2023 07:44   CT ABDOMEN PELVIS W CONTRAST Result Date: 12/20/2023 CLINICAL DATA:  Nausea, vomiting EXAM: CT ABDOMEN AND PELVIS WITH CONTRAST TECHNIQUE: Multidetector CT imaging of the abdomen and pelvis  was performed using the standard protocol following bolus administration of intravenous contrast. RADIATION DOSE REDUCTION: This exam was performed according to the departmental dose-optimization program which includes automated exposure control, adjustment of the mA and/or kV according to patient size and/or use of iterative reconstruction technique. CONTRAST:  75mL OMNIPAQUE  IOHEXOL  350 MG/ML SOLN COMPARISON:  10/04/2022. FINDINGS: Lower chest: Small bilateral pleural effusions. Dependent atelectasis in the lower lobes. Hepatobiliary: Prior cholecystectomy. No focal liver lesion. There is portal venous gas throughout the liver. Pancreas: No focal abnormality or ductal dilatation. Spleen: No focal abnormality.   Normal size. Adrenals/Urinary Tract: No adrenal abnormality. No focal renal abnormality. No stones or hydronephrosis. Urinary bladder is unremarkable. Stomach/Bowel: NG tube tip is in the fundus of the stomach. Significant gaseous distention of the stomach. There is pneumatosis noted within the wall predominantly seen in the fundus and proximal body. There appears to be a small amount of extraluminal air medial to the stomach near the GE junction (image 23 series 3. There is mild distention of proximal and mid small bowel loops. Caliber change noted in the right lower pelvis. Distal small bowel loops are slightly decompressed relative to the other small bowel loops. Findings concerning for distal small bowel obstruction. Scattered colonic diverticula. There is a left lateral abdominal wall/flank hernia containing the descending colon. No bowel obstruction at this level. Vascular/Lymphatic: No evidence of aneurysm or adenopathy. Aortic atherosclerosis. Mesenteric vessels appear patent. Reproductive: Prior hysterectomy.  No adnexal masses. Other: Trace free fluid in the pelvis and adjacent to the liver. Musculoskeletal: No acute bony abnormality. IMPRESSION: Significant gaseous distention of the stomach with pneumatosis noted in the proximal stomach wall. There appears to be a small amount of contained extraluminal air medial to the proximal stomach in the region of the GE junction. Associated portal venous gas. Dilated proximal and mid small bowel with slightly decompressed distal small bowel. Findings concerning for distal small bowel obstruction. Aortic atherosclerosis.  Mesenteric vessels appear patent. NG tube in the proximal stomach. Small amount of free fluid. Small bilateral pleural effusions. Dependent atelectasis in the lower lobes. Electronically Signed   By: Janeece Mechanic M.D.   On: 12/20/2023 01:25   DG Abd Portable 1V Result Date: 12/20/2023 CLINICAL DATA:  NG tube placement EXAM: PORTABLE ABDOMEN - 1  VIEW COMPARISON:  12/19/2023 FINDINGS: NG tube tip is in the proximal stomach with the side port near the GE junction. Marked gaseous distention of the stomach. Pneumatosis again noted within the proximal gastric wall. IMPRESSION: NG tube tip in the proximal stomach with the side port near the GE junction. This could be advanced several cm for optimal positioning. Continued gaseous distention of the stomach with pneumatosis. Electronically Signed   By: Janeece Mechanic M.D.   On: 12/20/2023 00:31   DG Abd 1 View Result Date: 12/19/2023 CLINICAL DATA:  Nausea, vomiting EXAM: ABDOMEN - 1 VIEW COMPARISON:  CT 10/04/2022, FINDINGS: There is marked gaseous distension of the stomach with pneumatosis involving the gastric fundus and lateral aspect of the proximal body of the stomach. Multiple prominent gas-filled loops of small bowel are present suggesting a developing ileus. Cholecystectomy clips are seen in the right upper quadrant. No gross free intraperitoneal gas. Small right pleural effusion. No acute bone abnormality. Lumbar fusion with instrumentation noted. IMPRESSION: 1. Marked gaseous distension of the stomach with pneumatosis involving the gastric fundus and lateral aspect of the proximal body of the stomach. Gastric ischemia, while unusual, could appear in this fashion. Further evaluation with contrast  enhanced CT imaging is recommended. 2. Developing ileus. Electronically Signed   By: Worthy Heads M.D.   On: 12/19/2023 23:10    Anti-infectives: Anti-infectives (From admission, onward)    Start     Dose/Rate Route Frequency Ordered Stop   12/20/23 1400  cefTRIAXone  (ROCEPHIN ) 1 g in sodium chloride  0.9 % 100 mL IVPB        1 g 200 mL/hr over 30 Minutes Intravenous Every 24 hours 12/20/23 1109 12/27/23 1359   12/20/23 1200  metroNIDAZOLE  (FLAGYL ) IVPB 500 mg        500 mg 100 mL/hr over 60 Minutes Intravenous Every 12 hours 12/20/23 1109 12/27/23 1159   12/20/23 0030  piperacillin -tazobactam  (ZOSYN ) IVPB 3.375 g  Status:  Discontinued        3.375 g 12.5 mL/hr over 240 Minutes Intravenous Every 8 hours 12/19/23 2333 12/20/23 0730        Assessment/Plan Gastric pneumatosis  83 yo female admitted after a CVA, now with acute abdominal pain and vomiting for the last day. Imaging workup shows gastric distension with pneumatosis. Her CT scan confirms this, as well as portal venous gas and a small area of contained extraluminal air by the GE junction.  - abdominal pain improving with NGT decompression, vitals are WNL, WBC downtrending (15 from 20), lactate did improve with IVF yesterday - hgb overall stable 11.1 from 13.5 - some of this is dilutional. Continue to trend hgb and monitor NG output.  - continue NG to LIWS and await bowel function. Will discuss need/timing of UGI prior to NG tube removal with MD, given foci of extraluminal air at GE jxn, CT scan on admission questions SBO with distal transition point. Clinically she is having flatus and KUB this AM looks like an improved bowel-gas pattern. Monitor. - no emergent surgical needs at present, we will follow.    LOS: 1 day   I reviewed nursing notes, hospitalist notes, last 24 h vitals and pain scores, last 48 h intake and output, last 24 h labs and trends, and last 24 h imaging results.  This care required moderate level of medical decision making.   Michial Akin, PA-C [facility] Please see Amion for pager number during day hours 7:00am-4:30pm

## 2023-12-21 NOTE — Progress Notes (Signed)
 Central cardiac monitoring reported 11 beats of SVT.  Patient is resting  with no complaints.

## 2023-12-21 NOTE — Progress Notes (Signed)
 PROGRESS NOTE    [name]  ZOX:[id number] DOB: 1940-10-25 DOA: 12/16/2023 PCP: Windell Hasty, DO   Brief Narrative:  [name] is a 83 y.o. female with medical history significant of hypertension, hyperlipidemia, GERD, uterine cancer, PE, DVT presenting with focal neurologic deficits.   Patient previously medically stable for discharge, awaiting SNF authorization and bed approval.  Unfortunately on the 17th patient had notably worsening nausea vomiting found to have acute onset ileus.  Discharge currently on hold pending further evaluation as below.  Assessment & Plan:   Principal Problem:   Focal neurological deficit Active Problems:   Malignant neoplasm of corpus uteri, except isthmus (HCC)   Right leg DVT (HCC)   Hx of pulmonary embolus   Essential hypertension   Mixed hyperlipidemia   Gastroesophageal reflux disease without esophagitis   Adynamic ileus (HCC)   Acute onset ileus Intractable nausea vomiting, improving Rule out GI bleed Surgery following, no indication for intervention at this time NG continue to have brown output Gastroccult positive H/H downtrending somewhat although still near baseline. If hgb continues to drop will discuss case with GI De-escalate antibiotics ceftriaxone , Flagyl  - without clear over etiology but ongoing leukocytosis  Lactic acidosis  - Likely secondary to above with notable dehydration and volume depletion, improving with IV fluids  Acute CVA, acute punctate bilateral parietal cortical infarcts, POA Dysphagia -Facial droop and slurred speech at intake, improving - Image confirms acute punctate infarcts, neurology consulted -PT OT speech per protocol -Hold Eliquis  -Echo: EF 60-65%; grade 1 diastolic dysfunction, without obvious shunt   Diarrhea, resolved Leukocytosis questionably reactive versus above -Unspecified, resolved; Cdiff negative - Leukocytosis elevated overnight with ileus as above  Syncope, likely  vagal Orthostatic hypotension  - Improving, continue IV fluids - Echocardiogram with EF 60 to 65% normal LV function with grade 1 diastolic dysfunction, without notable shunt   Hypokalemia  - Secondary to diarrhea, borderline low - follow repeat labs  Hypertension, currently hypotensive - Holding propranolol  in the setting of bradycardia and permissive hypertension - Holding Lasix  in the setting of above, continue IV fluids   Hyperlipidemia - Continue rosuvastatin    History of PE and DVT - Eliquis  on hold as above, new dose 5 mg twice daily given discussion with pharmacy   History of uterine cancer - Noted   DVT prophylaxis:   Code Status:   Code Status: Full Code Family Communication: None present  Status is: Inpatient  Dispo: The patient is from: Home              Anticipated d/c is to: To be determined              Anticipated d/c date is: 24 to 48 hours              Patient currently not medically stable for discharge  Consultants:  Neurology  Procedures:  None  Antimicrobials:  None  Subjective: No acute issues or events overnight tolerating NG tube quite well denies nausea vomiting headache fever chills chest pain shortness of breath.  Abdominal pain and distention appear to have resolved, denies any further bowel movements over the past 24 hours -she does report multiple episodes of flatus but nonspecifically.  Objective: Vitals:   12/20/23 1606 12/20/23 1936 12/20/23 2330 12/21/23 0422  BP: (!) 128/56 133/60 109/60 (!) 122/49  Pulse: (!) 110  97 70  Resp: 17 18 17 20   Temp: 98.4 F (36.9 C) 99.4 F (37.4 C) 98 F (36.7 C) 97.9  F (36.6 C)  TempSrc: Oral Oral Oral Oral  SpO2: 94% 91% 91% 92%  Weight:      Height:        Intake/Output Summary (Last 24 hours) at 12/21/2023 9528 Last data filed at 12/21/2023 0648 Gross per 24 hour  Intake --  Output 2250 ml  Net -2250 ml   Filed Weights   12/17/23 0416  Weight: 73.5 kg     Examination:  General:  Pleasantly resting in bed, No acute distress. HEENT: NG tube noted with dark brown fluid. Neck:  Without mass or deformity.  Trachea is midline. Lungs:  Clear to auscultate bilaterally without rhonchi, wheeze, or rales. Heart:  Regular rate and rhythm.  Without murmurs, rubs, or gallops. Abdomen: Nondistended, without guarding or rebound. Extremities: Without cyanosis, clubbing, edema, or obvious deformity. Skin:  Warm and dry, no erythema.  Data Reviewed: I have personally reviewed following labs and imaging studies  CBC: Recent Labs  Lab 12/16/23 1317 12/16/23 1322 12/17/23 0519 12/18/23 0606 12/19/23 2314 12/20/23 0545  WBC 18.9*  --  15.6* 14.6* 18.1* 20.6*  NEUTROABS 16.8*  --   --   --   --   --   HGB 13.2 13.9 12.7 12.6 14.3 13.5  HCT 40.0 41.0 37.0 37.8 42.5 41.3  MCV 98.0  --  93.7 96.2 94.9 97.4  PLT 187  --  176 151 217 186   Basic Metabolic Panel: Recent Labs  Lab 12/16/23 1317 12/16/23 1322 12/17/23 0519 12/18/23 0606 12/19/23 2314  NA 140 137 139 137 137  K 3.7 3.6 3.0* 4.0 3.6  CL 104 102 98 103 99  CO2 24  --  29 24 28   GLUCOSE 175* 173* 128* 115* 206*  BUN 14 14 11 10 18   CREATININE 0.84 0.80 0.95 0.57 0.98  CALCIUM  8.7*  --  8.4* 8.5* 8.5*   GFR: Estimated Creatinine Clearance: 43.5 mL/min (by C-G formula based on SCr of 0.98 mg/dL). Liver Function Tests: Recent Labs  Lab 12/16/23 1317 12/17/23 0519 12/19/23 2314  AST 32 32 28  ALT 21 19 16   ALKPHOS 18* 20* 19*  BILITOT 0.7 1.2 0.8  PROT 6.4* 6.2* 6.4*  ALBUMIN  3.2* 3.0* 2.5*    Coagulation Profile: Recent Labs  Lab 12/16/23 1317  INR 1.0   CBG: Recent Labs  Lab 12/20/23 0748 12/20/23 1453 12/20/23 1829 12/21/23 0023 12/21/23 0637  GLUCAP 118* 122* 118* 86 71   Recent Results (from the past 240 hours)  C Difficile Quick Screen w PCR reflex     Status: None   Collection Time: 12/18/23  8:20 PM   Specimen: STOOL  Result Value Ref Range  Status   C Diff antigen NEGATIVE NEGATIVE Final   C Diff toxin NEGATIVE NEGATIVE Final   C Diff interpretation No C. difficile detected.  Final    Comment: Performed at [facility], [address], [place], [place] [address]  Gastrointestinal Panel by PCR , Stool     Status: None   Collection Time: 12/18/23  8:20 PM   Specimen: Stool  Result Value Ref Range Status   Campylobacter species NOT DETECTED NOT DETECTED Final   Plesimonas shigelloides NOT DETECTED NOT DETECTED Final   Salmonella species NOT DETECTED NOT DETECTED Final   Yersinia enterocolitica NOT DETECTED NOT DETECTED Final   Vibrio species NOT DETECTED NOT DETECTED Final   Vibrio cholerae NOT DETECTED NOT DETECTED Final   Enteroaggregative E coli (EAEC) NOT DETECTED NOT DETECTED  Final   Enteropathogenic E coli (EPEC) NOT DETECTED NOT DETECTED Final   Enterotoxigenic E coli (ETEC) NOT DETECTED NOT DETECTED Final   Shiga like toxin producing E coli (STEC) NOT DETECTED NOT DETECTED Final   Shigella/Enteroinvasive E coli (EIEC) NOT DETECTED NOT DETECTED Final   Cryptosporidium NOT DETECTED NOT DETECTED Final   Cyclospora cayetanensis NOT DETECTED NOT DETECTED Final   Entamoeba histolytica NOT DETECTED NOT DETECTED Final   Giardia lamblia NOT DETECTED NOT DETECTED Final   Adenovirus F40/41 NOT DETECTED NOT DETECTED Final   Astrovirus NOT DETECTED NOT DETECTED Final   Norovirus GI/GII NOT DETECTED NOT DETECTED Final   Rotavirus A NOT DETECTED NOT DETECTED Final   Sapovirus (I, II, IV, and V) NOT DETECTED NOT DETECTED Final    Comment: Performed at [facility], [address] [place], [place] [address]         Radiology Studies: DG Abd 1 View Result Date: 12/20/2023 CLINICAL DATA:  Abdominal distension. EXAM: ABDOMEN - 1 VIEW COMPARISON:  12/20/2023, earlier same day FINDINGS: Interval decrease in marked distention of the stomach. Diffuse gaseous distention of small bowel and colon persists. Contrast  material in the bladder is compatible with recent CT imaging. Bones are diffusely demineralized. IMPRESSION: 1. Interval decrease in marked distention of the stomach. 2. Persistent diffuse gaseous distention of small bowel and colon. Electronically Signed   By: Donnal Fusi M.D.   On: 12/20/2023 07:44   CT ABDOMEN PELVIS W CONTRAST Result Date: 12/20/2023 CLINICAL DATA:  Nausea, vomiting EXAM: CT ABDOMEN AND PELVIS WITH CONTRAST TECHNIQUE: Multidetector CT imaging of the abdomen and pelvis was performed using the standard protocol following bolus administration of intravenous contrast. RADIATION DOSE REDUCTION: This exam was performed according to the departmental dose-optimization program which includes automated exposure control, adjustment of the mA and/or kV according to patient size and/or use of iterative reconstruction technique. CONTRAST:  75mL OMNIPAQUE  IOHEXOL  350 MG/ML SOLN COMPARISON:  10/04/2022. FINDINGS: Lower chest: Small bilateral pleural effusions. Dependent atelectasis in the lower lobes. Hepatobiliary: Prior cholecystectomy. No focal liver lesion. There is portal venous gas throughout the liver. Pancreas: No focal abnormality or ductal dilatation. Spleen: No focal abnormality.  Normal size. Adrenals/Urinary Tract: No adrenal abnormality. No focal renal abnormality. No stones or hydronephrosis. Urinary bladder is unremarkable. Stomach/Bowel: NG tube tip is in the fundus of the stomach. Significant gaseous distention of the stomach. There is pneumatosis noted within the wall predominantly seen in the fundus and proximal body. There appears to be a small amount of extraluminal air medial to the stomach near the GE junction (image 23 series 3. There is mild distention of proximal and mid small bowel loops. Caliber change noted in the right lower pelvis. Distal small bowel loops are slightly decompressed relative to the other small bowel loops. Findings concerning for distal small bowel  obstruction. Scattered colonic diverticula. There is a left lateral abdominal wall/flank hernia containing the descending colon. No bowel obstruction at this level. Vascular/Lymphatic: No evidence of aneurysm or adenopathy. Aortic atherosclerosis. Mesenteric vessels appear patent. Reproductive: Prior hysterectomy.  No adnexal masses. Other: Trace free fluid in the pelvis and adjacent to the liver. Musculoskeletal: No acute bony abnormality. IMPRESSION: Significant gaseous distention of the stomach with pneumatosis noted in the proximal stomach wall. There appears to be a small amount of contained extraluminal air medial to the proximal stomach in the region of the GE junction. Associated portal venous gas. Dilated proximal and mid small bowel with slightly decompressed distal  small bowel. Findings concerning for distal small bowel obstruction. Aortic atherosclerosis.  Mesenteric vessels appear patent. NG tube in the proximal stomach. Small amount of free fluid. Small bilateral pleural effusions. Dependent atelectasis in the lower lobes. Electronically Signed   By: Janeece Mechanic M.D.   On: 12/20/2023 01:25   DG Abd Portable 1V Result Date: 12/20/2023 CLINICAL DATA:  NG tube placement EXAM: PORTABLE ABDOMEN - 1 VIEW COMPARISON:  12/19/2023 FINDINGS: NG tube tip is in the proximal stomach with the side port near the GE junction. Marked gaseous distention of the stomach. Pneumatosis again noted within the proximal gastric wall. IMPRESSION: NG tube tip in the proximal stomach with the side port near the GE junction. This could be advanced several cm for optimal positioning. Continued gaseous distention of the stomach with pneumatosis. Electronically Signed   By: Janeece Mechanic M.D.   On: 12/20/2023 00:31   DG Abd 1 View Result Date: 12/19/2023 CLINICAL DATA:  Nausea, vomiting EXAM: ABDOMEN - 1 VIEW COMPARISON:  CT 10/04/2022, FINDINGS: There is marked gaseous distension of the stomach with pneumatosis involving the  gastric fundus and lateral aspect of the proximal body of the stomach. Multiple prominent gas-filled loops of small bowel are present suggesting a developing ileus. Cholecystectomy clips are seen in the right upper quadrant. No gross free intraperitoneal gas. Small right pleural effusion. No acute bone abnormality. Lumbar fusion with instrumentation noted. IMPRESSION: 1. Marked gaseous distension of the stomach with pneumatosis involving the gastric fundus and lateral aspect of the proximal body of the stomach. Gastric ischemia, while unusual, could appear in this fashion. Further evaluation with contrast enhanced CT imaging is recommended. 2. Developing ileus. Electronically Signed   By: Worthy Heads M.D.   On: 12/19/2023 23:10   Scheduled Meds:  lidocaine   1 patch Transdermal Q24H   rosuvastatin   20 mg Oral Daily   sodium chloride  flush  3 mL Intravenous Q12H   Continuous Infusions:  acetaminophen  1,000 mg (12/20/23 2228)   cefTRIAXone  (ROCEPHIN )  IV 1 g (12/20/23 1552)   metronidazole  500 mg (12/20/23 2336)   promethazine  (PHENERGAN ) injection (IM or IVPB) 12.5 mg (12/19/23 1808)      LOS: 1 day   Time spent:  Haydee Lipa, DO [facility]  If 7PM-7AM, please contact night-coverage [email or web address]  12/21/2023, 7:12 AM

## 2023-12-22 ENCOUNTER — Inpatient Hospital Stay (HOSPITAL_COMMUNITY)

## 2023-12-22 DIAGNOSIS — R29818 Other symptoms and signs involving the nervous system: Secondary | ICD-10-CM | POA: Diagnosis not present

## 2023-12-22 LAB — CBC
HCT: 33.1 % — ABNORMAL LOW (ref 36.0–46.0)
Hemoglobin: 10.8 g/dL — ABNORMAL LOW (ref 12.0–15.0)
MCH: 31.7 pg (ref 26.0–34.0)
MCHC: 32.6 g/dL (ref 30.0–36.0)
MCV: 97.1 fL (ref 80.0–100.0)
Platelets: 234 10*3/uL (ref 150–400)
RBC: 3.41 MIL/uL — ABNORMAL LOW (ref 3.87–5.11)
RDW: 14.5 % (ref 11.5–15.5)
WBC: 11.3 10*3/uL — ABNORMAL HIGH (ref 4.0–10.5)
nRBC: 0.2 % (ref 0.0–0.2)

## 2023-12-22 LAB — BASIC METABOLIC PANEL WITH GFR
Anion gap: 14 (ref 5–15)
BUN: 17 mg/dL (ref 8–23)
CO2: 28 mmol/L (ref 22–32)
Calcium: 7.8 mg/dL — ABNORMAL LOW (ref 8.9–10.3)
Chloride: 101 mmol/L (ref 98–111)
Creatinine, Ser: 0.75 mg/dL (ref 0.44–1.00)
GFR, Estimated: >60 mL/min (ref >60)
Glucose, Bld: 85 mg/dL (ref 70–99)
Potassium: 2.9 mmol/L — ABNORMAL LOW (ref 3.5–5.1)
Sodium: 143 mmol/L (ref 135–145)

## 2023-12-22 LAB — GLUCOSE, CAPILLARY
Glucose-Capillary: 73 mg/dL (ref 70–99)
Glucose-Capillary: 74 mg/dL (ref 70–99)
Glucose-Capillary: 75 mg/dL (ref 70–99)
Glucose-Capillary: 77 mg/dL (ref 70–99)

## 2023-12-22 MED ORDER — DIATRIZOATE MEGLUMINE & SODIUM 66-10 % PO SOLN
90.0000 mL | Freq: Once | ORAL | Status: AC
  Administered 2023-12-22: 90 mL via NASOGASTRIC
  Filled 2023-12-22: qty 90

## 2023-12-22 NOTE — Progress Notes (Signed)
 Gastrografin  ordered to be given by NG tube. Gastrografin  given and tube clamped for 1 hour. Patient tolerated without distress. Radiology called with administration time.

## 2023-12-22 NOTE — Progress Notes (Signed)
 PROGRESS NOTE    [name]  UEA:[id number] DOB: 04/28/1941 DOA: 12/16/2023 PCP: Windell Hasty, DO   Brief Narrative:  [name] is a 83 y.o. female with medical history significant of hypertension, hyperlipidemia, GERD, uterine cancer, PE, DVT presenting with focal neurologic deficits.   Patient previously medically stable for discharge, awaiting SNF authorization and bed approval.  Unfortunately on the 17th patient had notably worsening nausea vomiting found to have acute onset ileus.  Discharge currently on hold pending further evaluation as below.  Assessment & Plan:   Principal Problem:   Focal neurological deficit Active Problems:   Malignant neoplasm of corpus uteri, except isthmus (HCC)   Right leg DVT (HCC)   Hx of pulmonary embolus   Essential hypertension   Mixed hyperlipidemia   Gastroesophageal reflux disease without esophagitis   Adynamic ileus (HCC)  Acute onset ileus Intractable nausea vomiting, improving Rule out GI bleed Surgery following, no indication for intervention at this time; CT abd w/ contrast pending NG continue to have brown output - around 3L output over past 24h Gastroccult positive H/H stable; low normal conpared to baseline. If hgb continues to drop will discuss case with GI De-escalate antibiotics ceftriaxone , Flagyl  - without clear over etiology but ongoing leukocytosis  Lactic acidosis  - Likely secondary to above with notable dehydration and volume depletion, improving with IV fluids  Acute CVA, acute punctate bilateral parietal cortical infarcts, POA Dysphagia -Facial droop and slurred speech at intake, improving - Image confirms acute punctate infarcts, neurology consulted -PT OT speech per protocol -Hold Eliquis  -Echo: EF 60-65%; grade 1 diastolic dysfunction, without obvious shunt   Diarrhea, resolved Leukocytosis questionably reactive versus above -Unspecified, resolved; Cdiff negative - Leukocytosis elevated  overnight with ileus as above  Syncope, likely vagal Orthostatic hypotension  - Improving, continue IV fluids - Echocardiogram with EF 60 to 65% normal LV function with grade 1 diastolic dysfunction, without notable shunt   Hypokalemia  - Secondary to diarrhea, borderline low - follow repeat labs  Hypertension, currently hypotensive - Holding propranolol  in the setting of bradycardia and permissive hypertension - Holding Lasix  in the setting of above, continue IV fluids   Hyperlipidemia - Continue rosuvastatin    History of PE and DVT - Eliquis  on hold as above, new dose 5 mg twice daily given discussion with pharmacy   History of uterine cancer - Noted   DVT prophylaxis:   Code Status:   Code Status: Full Code Family Communication: None present  Status is: Inpatient  Dispo: The patient is from: Home              Anticipated d/c is to: To be determined              Anticipated d/c date is: 24 to 48 hours              Patient currently not medically stable for discharge  Consultants:  Neurology  Procedures:  None  Antimicrobials:  None  Subjective: No acute issues or events overnight excited for possible NG tube removal pending imaging as above.  Otherwise denies further episodes of nausea vomiting.  Notes ongoing flatus but no bowel movement.  Objective: Vitals:   12/21/23 1200 12/21/23 1953 12/22/23 0004 12/22/23 0416  BP: 126/74 136/60 135/60 (!) 142/53  Pulse: (!) 109 96 89 91  Resp:  18 18 18   Temp: 98.4 F (36.9 C) 98.6 F (37 C) 98.6 F (37 C) 98.4 F (36.9 C)  TempSrc: Oral Oral Oral  Oral  SpO2: 98% 91% 93% 93%  Weight:      Height:        Intake/Output Summary (Last 24 hours) at 12/22/2023 0651 Last data filed at 12/21/2023 0700 Gross per 24 hour  Intake --  Output 450 ml  Net -450 ml   Filed Weights   12/17/23 0416  Weight: 73.5 kg    Examination:  General:  Pleasantly resting in bed, No acute distress. HEENT: NG tube noted with  dark brown fluid. Neck:  Without mass or deformity.  Trachea is midline. Lungs:  Clear to auscultate bilaterally without rhonchi, wheeze, or rales. Heart:  Regular rate and rhythm.  Without murmurs, rubs, or gallops. Abdomen: Nondistended, without guarding or rebound. Extremities: Without cyanosis, clubbing, edema, or obvious deformity. Skin:  Warm and dry, no erythema.  Data Reviewed: I have personally reviewed following labs and imaging studies  CBC: Recent Labs  Lab 12/16/23 1317 12/16/23 1322 12/17/23 0519 12/18/23 0606 12/19/23 2314 12/20/23 0545 12/21/23 0614  WBC 18.9*  --  15.6* 14.6* 18.1* 20.6* 15.2*  NEUTROABS 16.8*  --   --   --   --   --   --   HGB 13.2   < > 12.7 12.6 14.3 13.5 11.1*  HCT 40.0   < > 37.0 37.8 42.5 41.3 32.8*  MCV 98.0  --  93.7 96.2 94.9 97.4 96.8  PLT 187  --  176 151 217 186 192   < > = values in this interval not displayed.   Basic Metabolic Panel: Recent Labs  Lab 12/16/23 1317 12/16/23 1322 12/17/23 0519 12/18/23 0606 12/19/23 2314 12/21/23 0614  NA 140 137 139 137 137 139  K 3.7 3.6 3.0* 4.0 3.6 3.3*  CL 104 102 98 103 99 103  CO2 24  --  29 24 28 26   GLUCOSE 175* 173* 128* 115* 206* 90  BUN 14 14 11 10 18 21   CREATININE 0.84 0.80 0.95 0.57 0.98 0.72  CALCIUM  8.7*  --  8.4* 8.5* 8.5* 7.6*   GFR: Estimated Creatinine Clearance: 53.2 mL/min (by C-G formula based on SCr of 0.72 mg/dL). Liver Function Tests: Recent Labs  Lab 12/16/23 1317 12/17/23 0519 12/19/23 2314  AST 32 32 28  ALT 21 19 16   ALKPHOS 18* 20* 19*  BILITOT 0.7 1.2 0.8  PROT 6.4* 6.2* 6.4*  ALBUMIN  3.2* 3.0* 2.5*    Coagulation Profile: Recent Labs  Lab 12/16/23 1317  INR 1.0   CBG: Recent Labs  Lab 12/21/23 0637 12/21/23 1230 12/21/23 1823 12/22/23 0018 12/22/23 0607  GLUCAP 71 94 93 75 77   Recent Results (from the past 240 hours)  C Difficile Quick Screen w PCR reflex     Status: None   Collection Time: 12/18/23  8:20 PM   Specimen:  STOOL  Result Value Ref Range Status   C Diff antigen NEGATIVE NEGATIVE Final   C Diff toxin NEGATIVE NEGATIVE Final   C Diff interpretation No C. difficile detected.  Final    Comment: Performed at [facility], [address] [place], [place] [address]  Gastrointestinal Panel by PCR , Stool     Status: None   Collection Time: 12/18/23  8:20 PM   Specimen: Stool  Result Value Ref Range Status   Campylobacter species NOT DETECTED NOT DETECTED Final   Plesimonas shigelloides NOT DETECTED NOT DETECTED Final   Salmonella species NOT DETECTED NOT DETECTED Final   Yersinia enterocolitica NOT DETECTED NOT DETECTED  Final   Vibrio species NOT DETECTED NOT DETECTED Final   Vibrio cholerae NOT DETECTED NOT DETECTED Final   Enteroaggregative E coli (EAEC) NOT DETECTED NOT DETECTED Final   Enteropathogenic E coli (EPEC) NOT DETECTED NOT DETECTED Final   Enterotoxigenic E coli (ETEC) NOT DETECTED NOT DETECTED Final   Shiga like toxin producing E coli (STEC) NOT DETECTED NOT DETECTED Final   Shigella/Enteroinvasive E coli (EIEC) NOT DETECTED NOT DETECTED Final   Cryptosporidium NOT DETECTED NOT DETECTED Final   Cyclospora cayetanensis NOT DETECTED NOT DETECTED Final   Entamoeba histolytica NOT DETECTED NOT DETECTED Final   Giardia lamblia NOT DETECTED NOT DETECTED Final   Adenovirus F40/41 NOT DETECTED NOT DETECTED Final   Astrovirus NOT DETECTED NOT DETECTED Final   Norovirus GI/GII NOT DETECTED NOT DETECTED Final   Rotavirus A NOT DETECTED NOT DETECTED Final   Sapovirus (I, II, IV, and V) NOT DETECTED NOT DETECTED Final    Comment: Performed at [facility], [address], [place], [place] [address]         Radiology Studies: DG Abd Portable 1V Result Date: 12/21/2023 CLINICAL DATA:  83 year old female with abdominal distension. EXAM: PORTABLE ABDOMEN - 1 VIEW COMPARISON:  Portable 12/20/2023 exam and earlier. FINDINGS: Portable AP supine view at 0818 hours. Satisfactory  enteric tube placement with side hole projecting at the level of the gastric body. Stable gastric contour. Non obstructed bowel gas pattern. Stable cholecystectomy clips. Chronic lumbar fusion, postoperative changes. No acute osseous abnormality identified. IMPRESSION: Satisfactory enteric tube position and nonobstructed bowel-gas pattern. Electronically Signed   By: Marlise Simpers M.D.   On: 12/21/2023 12:13   Scheduled Meds:  lidocaine   1 patch Transdermal Q24H   rosuvastatin   20 mg Oral Daily   sodium chloride  flush  3 mL Intravenous Q12H   Continuous Infusions:  cefTRIAXone  (ROCEPHIN )  IV 1 g (12/21/23 1403)   lactated ringers  75 mL/hr at 12/21/23 1203   metronidazole  500 mg (12/22/23 0025)   promethazine  (PHENERGAN ) injection (IM or IVPB) 12.5 mg (12/19/23 1808)      LOS: 2 days   Time spent:  Haydee Lipa, DO [facility]  If 7PM-7AM, please contact night-coverage [email or web address]  12/22/2023, 6:51 AM

## 2023-12-23 ENCOUNTER — Inpatient Hospital Stay (HOSPITAL_COMMUNITY)

## 2023-12-23 ENCOUNTER — Encounter (HOSPITAL_COMMUNITY): Payer: Self-pay | Admitting: Internal Medicine

## 2023-12-23 DIAGNOSIS — R29818 Other symptoms and signs involving the nervous system: Secondary | ICD-10-CM | POA: Diagnosis not present

## 2023-12-23 LAB — BASIC METABOLIC PANEL WITH GFR
Anion gap: 15 (ref 5–15)
BUN: 14 mg/dL (ref 8–23)
CO2: 28 mmol/L (ref 22–32)
Calcium: 8 mg/dL — ABNORMAL LOW (ref 8.9–10.3)
Chloride: 102 mmol/L (ref 98–111)
Creatinine, Ser: 0.74 mg/dL (ref 0.44–1.00)
GFR, Estimated: >60 mL/min (ref >60)
Glucose, Bld: 80 mg/dL (ref 70–99)
Potassium: 2.9 mmol/L — ABNORMAL LOW (ref 3.5–5.1)
Sodium: 145 mmol/L (ref 135–145)

## 2023-12-23 LAB — CBC
HCT: 32.3 % — ABNORMAL LOW (ref 36.0–46.0)
Hemoglobin: 10.5 g/dL — ABNORMAL LOW (ref 12.0–15.0)
MCH: 31.8 pg (ref 26.0–34.0)
MCHC: 32.5 g/dL (ref 30.0–36.0)
MCV: 97.9 fL (ref 80.0–100.0)
Platelets: 254 10*3/uL (ref 150–400)
RBC: 3.3 MIL/uL — ABNORMAL LOW (ref 3.87–5.11)
RDW: 14.6 % (ref 11.5–15.5)
WBC: 10.1 10*3/uL (ref 4.0–10.5)
nRBC: 0.2 % (ref 0.0–0.2)

## 2023-12-23 LAB — GLUCOSE, CAPILLARY
Glucose-Capillary: 141 mg/dL — ABNORMAL HIGH (ref 70–99)
Glucose-Capillary: 72 mg/dL (ref 70–99)
Glucose-Capillary: 76 mg/dL (ref 70–99)
Glucose-Capillary: 90 mg/dL (ref 70–99)

## 2023-12-23 LAB — TROPONIN I (HIGH SENSITIVITY)
Troponin I (High Sensitivity): 25 ng/L — ABNORMAL HIGH (ref <18)
Troponin I (High Sensitivity): 26 ng/L — ABNORMAL HIGH (ref <18)

## 2023-12-23 MED ORDER — IOHEXOL 300 MG/ML  SOLN: 100.0000 mL | Freq: Once | INTRAMUSCULAR | Status: DC | PRN

## 2023-12-23 MED ORDER — POTASSIUM CHLORIDE 10 MEQ/100ML IV SOLN
10.0000 meq | INTRAVENOUS | Status: AC
Start: 2023-12-23 — End: 2023-12-23
  Administered 2023-12-23 (×4): 10 meq via INTRAVENOUS
  Filled 2023-12-23 (×4): qty 100

## 2023-12-23 MED ORDER — POTASSIUM CHLORIDE 10 MEQ/100ML IV SOLN
10.0000 meq | INTRAVENOUS | Status: AC
  Administered 2023-12-23 (×2): 10 meq via INTRAVENOUS

## 2023-12-23 MED ORDER — PANTOPRAZOLE SODIUM 40 MG IV SOLR
40.0000 mg | Freq: Two times a day (BID) | INTRAVENOUS | Status: DC
  Administered 2023-12-23 – 2023-12-26 (×7): 40 mg via INTRAVENOUS
  Filled 2023-12-23 (×7): qty 10

## 2023-12-23 NOTE — Progress Notes (Signed)
 Patient c/o chest pain, provider notified, orders received.

## 2023-12-23 NOTE — TOC Progression Note (Signed)
 Transition of Care [facility]) - Progression Note    Patient Details  Name: [name] MRN: [id number] Date of Birth: Mar 23, 1941  [facility] CM/SW Contact  Jonathan Neighbor, RN Phone Number: 12/23/2023, 12:29 PM  Clinical Narrative:     CM has updated Tracey at [facility] that pt is not medically ready. Her insurance auth for [employer] runs out today. Pt will need new auth once medically ready for SNF rehab.  TOC following.  Expected Discharge Plan: Skilled Nursing Facility    Expected Discharge Plan and Services   Discharge Planning Services: CM Consult   Living arrangements for the past 2 months: Single Family Home                                       Social Determinants of Health (SDOH) Interventions SDOH Screenings   Food Insecurity: No Food Insecurity (12/17/2023)  Housing: Low Risk  (12/17/2023)  Transportation Needs: No Transportation Needs (12/17/2023)  Utilities: Not At Risk (12/17/2023)  Social Connections: Moderately Integrated (12/17/2023)  Tobacco Use: Low Risk  (12/16/2023)    Readmission Risk Interventions     No data to display

## 2023-12-23 NOTE — Progress Notes (Signed)
 [facility] night cross cover note:   I was notified by RN that the patient experienced chest discomfort with ambulating to bedside commode this morning, with chest pain, resolved with rest.  Not associate any additional symptoms.  Mildly tachycardic at the time that she is experiencing chest pain. Now chest pain-free. Trop  X 2 ordered as well as EKG.     Camelia Cavalier, DO Hospitalist

## 2023-12-23 NOTE — Progress Notes (Signed)
 NG clamped at this time per order.

## 2023-12-23 NOTE — Progress Notes (Signed)
 Subjective: CC: No abdominal pain or nausea. NGT w/ 1.225L/24 hours - bloody tinged. BM yesterday. Contrast in colon on 4/20.  Afebrile. No tachycardia or hypotension. WBC wnl. Hgb stable currently at 10.5 (10.8)  Objective: Vital signs in last 24 hours: Temp:  [98.1 F (36.7 C)-98.6 F (37 C)] 98.2 F (36.8 C) (04/21 0741) Pulse Rate:  [81-86] 81 (04/21 0741) Resp:  [13-18] 13 (04/21 0741) BP: (137-153)/(58-65) 143/65 (04/21 0741) SpO2:  [92 %-97 %] 93 % (04/21 0741) Last BM Date : 12/22/23  Intake/Output from previous day: 04/20 0701 - 04/21 0700 In: 100 [IV Piggyback:100] Out: 1225 [Emesis/NG output:1225] Intake/Output this shift: No intake/output data recorded.  PE: Gen:  Alert, NAD, pleasant Abd: Soft, ND, NT, +BS. NGT in place with blood tinged output.  Psych: A&Ox3   Lab Results:  Recent Labs    12/22/23 0759 12/23/23 0612  WBC 11.3* 10.1  HGB 10.8* 10.5*  HCT 33.1* 32.3*  PLT 234 254   BMET Recent Labs    12/22/23 0759 12/23/23 0612  NA 143 145  K 2.9* 2.9*  CL 101 102  CO2 28 28  GLUCOSE 85 80  BUN 17 14  CREATININE 0.75 0.74  CALCIUM  7.8* 8.0*   PT/INR No results for input(s): "LABPROT", "INR" in the last 72 hours. CMP     Component Value Date/Time   NA 145 12/23/2023 0612   NA 142 07/23/2022 0850   NA 140 02/28/2017 1310   K 2.9 (L) 12/23/2023 0612   K 4.3 02/28/2017 1310   CL 102 12/23/2023 0612   CO2 28 12/23/2023 0612   CO2 27 02/28/2017 1310   GLUCOSE 80 12/23/2023 0612   GLUCOSE 93 02/28/2017 1310   BUN 14 12/23/2023 0612   BUN 13 07/23/2022 0850   BUN 13.9 02/28/2017 1310   CREATININE 0.74 12/23/2023 0612   CREATININE 0.8 02/28/2017 1310   CALCIUM  8.0 (L) 12/23/2023 0612   CALCIUM  10.1 02/28/2017 1310   PROT 6.4 (L) 12/19/2023 2314   PROT 6.7 02/28/2017 1310   ALBUMIN  2.5 (L) 12/19/2023 2314   ALBUMIN  3.7 02/28/2017 1310   AST 28 12/19/2023 2314   AST 24 02/28/2017 1310   ALT 16 12/19/2023 2314   ALT 14  02/28/2017 1310   ALKPHOS 19 (L) 12/19/2023 2314   ALKPHOS 35 (L) 02/28/2017 1310   BILITOT 0.8 12/19/2023 2314   BILITOT 0.53 02/28/2017 1310   GFRNONAA >60 12/23/2023 0612   GFRAA >60 05/04/2016 0309   Lipase     Component Value Date/Time   LIPASE 28 12/19/2023 2314    Studies/Results: DG Abd Portable 1V-Small Bowel Obstruction Protocol-initial, 8 hr delay Result Date: 12/22/2023 CLINICAL DATA:  Fall small-bowel obstruction. EXAM: PORTABLE ABDOMEN - 1 VIEW COMPARISON:  Portable abdomen film yesterday at 8:18 a.m. FINDINGS: 7:46 p.m. NGT remains in place with tip in the body of stomach. There is contrast within the nondilated colon through to the rectum, contrast within scattered nondilated small bowel segments. No dilated small bowel is seen at this time. There is no supine evidence of free air. There are cholecystectomy clips and old multilevel lumbar spine fusion construct. There are small bilateral pleural effusions with consolidation or atelectasis in the left greater than right lung bases. Osteopenia and advanced degenerative change lower thoracic spine. IMPRESSION: 1. Contrast within the nondilated colon through to the rectum, contrast within scattered nondilated small bowel segments. No dilated small bowel is seen at this time. 2. NGT  tip in the body of stomach. 3. Small bilateral pleural effusions with consolidation or atelectasis in the left greater than right lung bases. Electronically Signed   By: Denman Fischer M.D.   On: 12/22/2023 23:40    Anti-infectives: Anti-infectives (From admission, onward)    Start     Dose/Rate Route Frequency Ordered Stop   12/20/23 1400  cefTRIAXone  (ROCEPHIN ) 1 g in sodium chloride  0.9 % 100 mL IVPB        1 g 200 mL/hr over 30 Minutes Intravenous Every 24 hours 12/20/23 1109 12/27/23 1359   12/20/23 1200  metroNIDAZOLE  (FLAGYL ) IVPB 500 mg        500 mg 100 mL/hr over 60 Minutes Intravenous Every 12 hours 12/20/23 1109 12/27/23 1159   12/20/23  0030  piperacillin -tazobactam (ZOSYN ) IVPB 3.375 g  Status:  Discontinued        3.375 g 12.5 mL/hr over 240 Minutes Intravenous Every 8 hours 12/19/23 2333 12/20/23 0730       Assessment/Plan Gastric pneumatosis - CT 4/18 w/ gastric distension with pneumatosis of the proximal stomach wall, associated PVG and a small area of contained extraluminal air by the GE junction.  - HDS without fever, tachycardia or hypotension. No peritonitis on exam. WBC wnl. No current indication for emergency surgery - Cont IV abx - Trend hgb/hct. Having some bloody NGT output. Anticoagulation on hold per primary. This may be some mucosal sloughing. Consider adding PPI. If improving by other accounts and does not resolve, consider touching base with GI. Hgb stable currently at 10.5 (10.8) - Would like to get PO water soluble UGI study to evaluate the small area of contained extraluminal air by the GE junction.   SBO - CT 4/18 also c/f SBO w/ dilated proximal and mid small bowel with slightly decompressed distal small bowel. Hx of hysterectomy for endometrial cancer.  - HDS without fever, tachycardia or hypotension. No peritonitis on exam. WBC wnl. No current indication for emergency surgery - Keep K >=4, Phos >= 3, Mg >= 2 and mobilize for bowel function - Contrast in colon on xray. Pt having bowel function.  - NGT clamping trial today  FEN - Clamp NGT. IVF per primary  VTE - SCDs, on hold, LD Eliquis   4/17 AM ID - Rocpehin/Flagyl  Plan - Clamp ngt and get PO UGI  I reviewed nursing notes, last 24 h vitals and pain scores, last 48 h intake and output, last 24 h labs and trends, and last 24 h imaging results.   LOS: 3 days    Delton Filbert, [facility] 12/23/2023, 9:34 AM Please see Amion for pager number during day hours 7:00am-4:30pm

## 2023-12-23 NOTE — Progress Notes (Signed)
 PROGRESS NOTE    MARLIES LIGMAN  QIO:[id number] DOB: 10-Dec-1940 DOA: 12/16/2023 PCP: Windell Hasty, DO   Brief Narrative:  [name] is a 83 y.o. female with medical history significant of hypertension, hyperlipidemia, GERD, uterine cancer, PE, DVT presenting with focal neurologic deficits.   Patient previously medically stable for discharge, awaiting SNF authorization and bed approval.  Unfortunately on the 17th patient had notably worsening nausea vomiting found to have acute onset ileus.  Discharge currently on hold pending further evaluation as below.  Assessment & Plan:   Principal Problem:   Focal neurological deficit Active Problems:   Malignant neoplasm of corpus uteri, except isthmus (HCC)   Right leg DVT (HCC)   Hx of pulmonary embolus   Essential hypertension   Mixed hyperlipidemia   Gastroesophageal reflux disease without esophagitis   Adynamic ileus (HCC)  Acute onset ileus Intractable nausea vomiting, improving Rule out GI bleed Surgery following, no indication for intervention at this time CT abdomen pelvis with contrast on the 20th shows contrast through to the rectum Clamp trial today -repeat imaging per surgery -hopeful to remove NG tube later today if tolerating clamping Gastroccult positive but hemoglobin fairly stable in the setting of daily phlebotomy - if hgb continues to drop will discuss case with GI Discontinue antibiotics ceftriaxone , Flagyl  (4/21) -no signs or symptoms of infection at this time - Twice daily PPI  Acute CVA, acute punctate bilateral parietal cortical infarcts, POA Dysphagia -Facial droop and slurred speech at intake, resolved - Image confirms acute punctate infarcts, neurology consulted - PT OT speech per protocol - Hold Eliquis  -Echo: EF 60-65%; grade 1 diastolic dysfunction, without obvious shunt   Diarrhea, resolved Hypokalemia Leukocytosis questionably reactive versus above Lactic acidosis, resolved -Unspecified  source for diarrhea, resolved; Cdiff negative - Leukocytosis resolved, antibiotics discontinued - Potassium supplementation as indicated, likely from GI losses - Lactic acidosis minimal, resolved  Syncope, likely vagal Orthostatic hypotension  - Improving, continue IV fluids - Echocardiogram with EF 60 to 65% normal LV function with grade 1 diastolic dysfunction, without notable shunt  Hypertension, currently hypotensive - Hold propranolol /furosemide    Hyperlipidemia - Continue rosuvastatin    History of PE and DVT - Eliquis  on hold as above, new dose 5 mg twice daily given discussion with pharmacy   History of uterine cancer - Noted   DVT prophylaxis:   Code Status:   Code Status: Full Code Family Communication: None present  Status is: Inpatient  Dispo: The patient is from: Home              Anticipated d/c is to: To be determined              Anticipated d/c date is: 24 to 48 hours              Patient currently not medically stable for discharge  Consultants:  Neurology  Procedures:  None  Antimicrobials:  None  Subjective: No acute issues or events overnight -remains anxious about diagnosis, hopeful to have NG tube removed later today -denies any further episodes of nausea vomiting abdominal distention or pain..  Objective: Vitals:   12/22/23 1114 12/22/23 1614 12/22/23 2049 12/23/23 0148  BP: (!) 137/58 139/60 (!) 153/60 (!) 144/64  Pulse: 86 85 85 83  Resp: 18 18 18 18   Temp: 98.3 F (36.8 C) 98.6 F (37 C) 98.1 F (36.7 C) 98.5 F (36.9 C)  TempSrc: Oral Oral Oral Oral  SpO2: 93% 95% 92% 97%  Weight:  Height:        Intake/Output Summary (Last 24 hours) at 12/23/2023 0726 Last data filed at 12/23/2023 0700 Gross per 24 hour  Intake 100 ml  Output 1225 ml  Net -1125 ml   Filed Weights   12/17/23 0416  Weight: 73.5 kg    Examination:  General:  Pleasantly resting in bed, No acute distress. HEENT: NG tube noted with dark brown  fluid. Neck:  Without mass or deformity.  Trachea is midline. Lungs:  Clear to auscultate bilaterally without rhonchi, wheeze, or rales. Heart:  Regular rate and rhythm.  Without murmurs, rubs, or gallops. Abdomen: Nondistended, without guarding or rebound. Extremities: Without cyanosis, clubbing, edema, or obvious deformity. Skin:  Warm and dry, no erythema.  Data Reviewed: I have personally reviewed following labs and imaging studies  CBC: Recent Labs  Lab 12/16/23 1317 12/16/23 1322 12/18/23 0606 12/19/23 2314 12/20/23 0545 12/21/23 0614 12/22/23 0759  WBC 18.9*   < > 14.6* 18.1* 20.6* 15.2* 11.3*  NEUTROABS 16.8*  --   --   --   --   --   --   HGB 13.2   < > 12.6 14.3 13.5 11.1* 10.8*  HCT 40.0   < > 37.8 42.5 41.3 32.8* 33.1*  MCV 98.0   < > 96.2 94.9 97.4 96.8 97.1  PLT 187   < > 151 217 186 192 234   < > = values in this interval not displayed.   Basic Metabolic Panel: Recent Labs  Lab 12/17/23 0519 12/18/23 0606 12/19/23 2314 12/21/23 0614 12/22/23 0759  NA 139 137 137 139 143  K 3.0* 4.0 3.6 3.3* 2.9*  CL 98 103 99 103 101  CO2 29 24 28 26 28   GLUCOSE 128* 115* 206* 90 85  BUN 11 10 18 21 17   CREATININE 0.95 0.57 0.98 0.72 0.75  CALCIUM  8.4* 8.5* 8.5* 7.6* 7.8*   GFR: Estimated Creatinine Clearance: 53.2 mL/min (by C-G formula based on SCr of 0.75 mg/dL). Liver Function Tests: Recent Labs  Lab 12/16/23 1317 12/17/23 0519 12/19/23 2314  AST 32 32 28  ALT 21 19 16   ALKPHOS 18* 20* 19*  BILITOT 0.7 1.2 0.8  PROT 6.4* 6.2* 6.4*  ALBUMIN  3.2* 3.0* 2.5*    Coagulation Profile: Recent Labs  Lab 12/16/23 1317  INR 1.0   CBG: Recent Labs  Lab 12/22/23 0607 12/22/23 1114 12/22/23 1913 12/23/23 0031 12/23/23 0622  GLUCAP 77 73 74 76 72   Recent Results (from the past 240 hours)  C Difficile Quick Screen w PCR reflex     Status: None   Collection Time: 12/18/23  8:20 PM   Specimen: STOOL  Result Value Ref Range Status   C Diff antigen  NEGATIVE NEGATIVE Final   C Diff toxin NEGATIVE NEGATIVE Final   C Diff interpretation No C. difficile detected.  Final    Comment: Performed at [facility], [address], [place], [place] [address]  Gastrointestinal Panel by PCR , Stool     Status: None   Collection Time: 12/18/23  8:20 PM   Specimen: Stool  Result Value Ref Range Status   Campylobacter species NOT DETECTED NOT DETECTED Final   Plesimonas shigelloides NOT DETECTED NOT DETECTED Final   Salmonella species NOT DETECTED NOT DETECTED Final   Yersinia enterocolitica NOT DETECTED NOT DETECTED Final   Vibrio species NOT DETECTED NOT DETECTED Final   Vibrio cholerae NOT DETECTED NOT DETECTED Final   Enteroaggregative E coli (  EAEC) NOT DETECTED NOT DETECTED Final   Enteropathogenic E coli (EPEC) NOT DETECTED NOT DETECTED Final   Enterotoxigenic E coli (ETEC) NOT DETECTED NOT DETECTED Final   Shiga like toxin producing E coli (STEC) NOT DETECTED NOT DETECTED Final   Shigella/Enteroinvasive E coli (EIEC) NOT DETECTED NOT DETECTED Final   Cryptosporidium NOT DETECTED NOT DETECTED Final   Cyclospora cayetanensis NOT DETECTED NOT DETECTED Final   Entamoeba histolytica NOT DETECTED NOT DETECTED Final   Giardia lamblia NOT DETECTED NOT DETECTED Final   Adenovirus F40/41 NOT DETECTED NOT DETECTED Final   Astrovirus NOT DETECTED NOT DETECTED Final   Norovirus GI/GII NOT DETECTED NOT DETECTED Final   Rotavirus A NOT DETECTED NOT DETECTED Final   Sapovirus (I, II, IV, and V) NOT DETECTED NOT DETECTED Final    Comment: Performed at [facility], [address] [place], [place] [address]         Radiology Studies: DG Abd Portable 1V-Small Bowel Obstruction Protocol-initial, 8 hr delay Result Date: 12/22/2023 CLINICAL DATA:  Fall small-bowel obstruction. EXAM: PORTABLE ABDOMEN - 1 VIEW COMPARISON:  Portable abdomen film yesterday at 8:18 a.m. FINDINGS: 7:46 p.m. NGT remains in place with tip in the body of stomach.  There is contrast within the nondilated colon through to the rectum, contrast within scattered nondilated small bowel segments. No dilated small bowel is seen at this time. There is no supine evidence of free air. There are cholecystectomy clips and old multilevel lumbar spine fusion construct. There are small bilateral pleural effusions with consolidation or atelectasis in the left greater than right lung bases. Osteopenia and advanced degenerative change lower thoracic spine. IMPRESSION: 1. Contrast within the nondilated colon through to the rectum, contrast within scattered nondilated small bowel segments. No dilated small bowel is seen at this time. 2. NGT tip in the body of stomach. 3. Small bilateral pleural effusions with consolidation or atelectasis in the left greater than right lung bases. Electronically Signed   By: Denman Fischer M.D.   On: 12/22/2023 23:40   DG Abd Portable 1V Result Date: 12/21/2023 CLINICAL DATA:  83 year old female with abdominal distension. EXAM: PORTABLE ABDOMEN - 1 VIEW COMPARISON:  Portable 12/20/2023 exam and earlier. FINDINGS: Portable AP supine view at 0818 hours. Satisfactory enteric tube placement with side hole projecting at the level of the gastric body. Stable gastric contour. Non obstructed bowel gas pattern. Stable cholecystectomy clips. Chronic lumbar fusion, postoperative changes. No acute osseous abnormality identified. IMPRESSION: Satisfactory enteric tube position and nonobstructed bowel-gas pattern. Electronically Signed   By: Marlise Simpers M.D.   On: 12/21/2023 12:13   Scheduled Meds:  lidocaine   1 patch Transdermal Q24H   rosuvastatin   20 mg Oral Daily   sodium chloride  flush  3 mL Intravenous Q12H   Continuous Infusions:  cefTRIAXone  (ROCEPHIN )  IV 1 g (12/22/23 1423)   metronidazole  500 mg (12/22/23 2344)   promethazine  (PHENERGAN ) injection (IM or IVPB) 12.5 mg (12/19/23 1808)      LOS: 3 days   Time spent:  Haydee Lipa, DO [facility]  If 7PM-7AM, please contact night-coverage [email or web address]  12/23/2023, 7:26 AM

## 2023-12-23 NOTE — Progress Notes (Signed)
 Physical Therapy Treatment Patient Details Name: [name] MRN: [id number] DOB: Sep 05, 1940 Today's Date: 12/23/2023   History of Present Illness 83 y.o. female presents to [facility] hospital on 12/16/2023 after a syncopal episode and fall. After the fall pt was noted to have slurred speech and facial droop. MRI notable for bilateral parietal infarcts. PMH includes HTN, HLD, GERD, uterine CA, PE, DVT.    PT Comments  Patient resting in bed and agreeable to mobilize with therapy. Initially requesting OOB to [facility] and pt completed supine>sit at mod ind level and bed>BSC with supervision. Light use of hands to steady self noted. Pt required max assist for pericare and transferred BSC>recliner. Pt c/o some dizziness and HR noted to bed 149 bpm max. Gait deferred due to tachycardia with transfers, pt participated in functional LE exercises for strengthening and endurance. EOS pt remained in recliner, Alarm on and call bell within reach. Will continue to progress pt as able during stay.    If plan is discharge home, recommend the following: A little help with walking and/or transfers;Assistance with cooking/housework;Assist for transportation;Help with stairs or ramp for entrance   Can travel by private vehicle        Equipment Recommendations  None recommended by PT    Recommendations for Other Services       Precautions / Restrictions Precautions Precautions: Fall Recall of Precautions/Restrictions: Intact Precaution/Restrictions Comments: reports chest pain from fall but states its improved from initial pain, NGT Restrictions Weight Bearing Restrictions Per Provider Order: No Other Position/Activity Restrictions: NG tube     Mobility  Bed Mobility Overal bed mobility: Modified Independent Bed Mobility: Supine to Sit     Supine to sit: Modified independent (Device/Increase time), Used rails     General bed mobility comments: use of bed features and time    Transfers Overall  transfer level: Needs assistance Equipment used: None Transfers: Sit to/from Stand, Bed to chair/wheelchair/BSC Sit to Stand: Supervision   Step pivot transfers: Supervision       General transfer comment: sup for safety, light use of hands for power up from EOB and to guide turn bed<>BSC<>recliner. HR elevated to 149 bpm max with transfer and gait deferred. Recovered to 100 with seated rest.    Ambulation/Gait                   Stairs             Wheelchair Mobility     Tilt Bed    Modified Rankin (Stroke Patients Only)       Balance Overall balance assessment: Needs assistance Sitting-balance support: No upper extremity supported, Feet supported Sitting balance-Leahy Scale: Good     Standing balance support: No upper extremity supported, During functional activity Standing balance-Leahy Scale: Fair Standing balance comment: no overt LOB.                            Communication Communication Communication: No apparent difficulties  Cognition Arousal: Alert Behavior During Therapy: WFL for tasks assessed/performed, Anxious (fearful of possibility of passing out)   PT - Cognitive impairments: No apparent impairments, No family/caregiver present to determine baseline                         Following commands: Intact      Cueing Cueing Techniques: Verbal cues  Exercises Other Exercises Other Exercises: 2x5 reps sit<>stand Other Exercises: 1x marching, light  touch support on RW with bil UE. HR elevated to 130-140's wtih activity. Other Exercises: 2x3 rounds box breathing    General Comments        Pertinent Vitals/Pain Pain Assessment Pain Assessment: Faces Faces Pain Scale: Hurts a little bit Pain Location: chest Pain Descriptors / Indicators: Discomfort, Sore Pain Intervention(s): Limited activity within patient's tolerance, Monitored during session, Repositioned    Home Living                           Prior Function            PT Goals (current goals can now be found in the care plan section) Acute Rehab PT Goals Patient Stated Goal: to reduce pain at chest when mobilizing PT Goal Formulation: With patient Time For Goal Achievement: 12/31/23 Potential to Achieve Goals: Good Progress towards PT goals: Progressing toward goals    Frequency    Min 3X/week      PT Plan      Co-evaluation              AM-PAC PT "6 Clicks" Mobility   Outcome Measure  Help needed turning from your back to your side while in a flat bed without using bedrails?: A Little Help needed moving from lying on your back to sitting on the side of a flat bed without using bedrails?: A Little Help needed moving to and from a bed to a chair (including a wheelchair)?: A Little Help needed standing up from a chair using your arms (e.g., wheelchair or bedside chair)?: A Little Help needed to walk in hospital room?: A Little Help needed climbing 3-5 steps with a railing? : A Little 6 Click Score: 18    End of Session Equipment Utilized During Treatment: Gait belt Activity Tolerance: Patient tolerated treatment well Patient left: with call bell/phone within reach;in chair;with chair alarm set Nurse Communication: Mobility status PT Visit Diagnosis: Other abnormalities of gait and mobility (R26.89);Muscle weakness (generalized) (M62.81);Pain     Time: 8119-1478 PT Time Calculation (min) (ACUTE ONLY): 35 min  Charges:    $Therapeutic Exercise: 8-22 mins $Therapeutic Activity: 8-22 mins PT General Charges $$ ACUTE PT VISIT: 1 Visit                     Tish Forge, DPT Acute Rehabilitation Services Office [phone]  12/23/23 12:31 PM

## 2023-12-24 DIAGNOSIS — I6340 Cerebral infarction due to embolism of unspecified cerebral artery: Secondary | ICD-10-CM | POA: Diagnosis not present

## 2023-12-24 DIAGNOSIS — Z7901 Long term (current) use of anticoagulants: Secondary | ICD-10-CM | POA: Diagnosis not present

## 2023-12-24 LAB — CBC
HCT: 29.3 % — ABNORMAL LOW (ref 36.0–46.0)
Hemoglobin: 9.7 g/dL — ABNORMAL LOW (ref 12.0–15.0)
MCH: 32.6 pg (ref 26.0–34.0)
MCHC: 33.1 g/dL (ref 30.0–36.0)
MCV: 98.3 fL (ref 80.0–100.0)
Platelets: 259 10*3/uL (ref 150–400)
RBC: 2.98 MIL/uL — ABNORMAL LOW (ref 3.87–5.11)
RDW: 14.6 % (ref 11.5–15.5)
WBC: 10.7 10*3/uL — ABNORMAL HIGH (ref 4.0–10.5)
nRBC: 0.3 % — ABNORMAL HIGH (ref 0.0–0.2)

## 2023-12-24 LAB — BASIC METABOLIC PANEL WITH GFR
Anion gap: 10 (ref 5–15)
BUN: 11 mg/dL (ref 8–23)
CO2: 27 mmol/L (ref 22–32)
Calcium: 7.4 mg/dL — ABNORMAL LOW (ref 8.9–10.3)
Chloride: 103 mmol/L (ref 98–111)
Creatinine, Ser: 0.64 mg/dL (ref 0.44–1.00)
GFR, Estimated: >60 mL/min (ref >60)
Glucose, Bld: 97 mg/dL (ref 70–99)
Potassium: 3.2 mmol/L — ABNORMAL LOW (ref 3.5–5.1)
Sodium: 140 mmol/L (ref 135–145)

## 2023-12-24 LAB — GLUCOSE, CAPILLARY
Glucose-Capillary: 88 mg/dL (ref 70–99)
Glucose-Capillary: 79 mg/dL (ref 70–99)
Glucose-Capillary: 101 mg/dL — ABNORMAL HIGH (ref 70–99)

## 2023-12-24 MED ORDER — INSULIN ASPART 100 UNIT/ML IJ SOLN: 0.0000 [IU] | Freq: Every day | INTRAMUSCULAR | Status: DC

## 2023-12-24 MED ORDER — POTASSIUM CHLORIDE 20 MEQ PO PACK
20.0000 meq | PACK | Freq: Two times a day (BID) | ORAL | Status: AC
  Administered 2023-12-24 (×2): 20 meq via ORAL
  Filled 2023-12-24 (×2): qty 1

## 2023-12-24 MED ORDER — LACTATED RINGERS IV SOLN: Freq: Once | INTRAVENOUS | Status: AC

## 2023-12-24 MED ORDER — INSULIN ASPART 100 UNIT/ML IJ SOLN
0.0000 [IU] | Freq: Three times a day (TID) | INTRAMUSCULAR | Status: DC
  Administered 2023-12-25 – 2023-12-26 (×2): 1 [IU] via SUBCUTANEOUS

## 2023-12-24 NOTE — Progress Notes (Signed)
 Subjective/Chief Complaint: No complaints Resting comfortably No nausea or vomiting Has been clamped since yesterday UGI - negative for leak or perforation Had several BM yesterday per patient   Objective: Vital signs in last 24 hours: Temp:  [97.6 F (36.4 C)-98 F (36.7 C)] 97.9 F (36.6 C) (04/22 0447) Pulse Rate:  [77-96] 77 (04/22 0447) Resp:  [12-18] 18 (04/22 0447) BP: (132-151)/(59-79) 136/65 (04/22 0447) SpO2:  [94 %] 94 % (04/22 0447) Last BM Date : 12/23/23  Intake/Output from previous day: 04/21 0701 - 04/22 0700 In: 538.6 [P.O.:480; I.V.:3; IV Piggyback:55.6] Out: 175 [Emesis/NG output:175] Intake/Output this shift: No intake/output data recorded.  Gen:  Alert, NAD, pleasant Abd: Soft, ND, NT, +BS. NGT in place with blood tinged output in tubing.  Psych: A&Ox3   Lab Results:  Recent Labs    12/23/23 0612 12/24/23 0552  WBC 10.1 10.7*  HGB 10.5* 9.7*  HCT 32.3* 29.3*  PLT 254 259   BMET Recent Labs    12/23/23 0612 12/24/23 0552  NA 145 140  K 2.9* 3.2*  CL 102 103  CO2 28 27  GLUCOSE 80 97  BUN 14 11  CREATININE 0.74 0.64  CALCIUM  8.0* 7.4*     Studies/Results: DG ESOPHAGUS W SINGLE CM (SOL OR THIN BA) Result Date: 12/24/2023 CLINICAL DATA:  83 year old female admitted one week ago with small bowel obstruction with CT evidence of gastric pneumatosis, small volume extraluminal air at the gastroesophageal junction. Barium swallow requested. EXAM: ESOPHAGUS/BARIUM SWALLOW/TABLET STUDY TECHNIQUE: Single contrast examination was performed using Omnipaque  300. This exam was performed by Quintin Buckle PA-C, and was supervised and interpreted by Marland Silvas, MD. FLUOROSCOPY: Radiation Exposure Index (as provided by the fluoroscopic device): 22.0 mGy Kerma COMPARISON:  CT ABDOMEN PEVIS 12/20/23 FINDINGS: Patient able to swallow 100 mL of Omnipaque  300 without issue. Esophagus intact, normal contour and course. Contrast empties into the stomach.  No evidence of contrast extravasation. IMPRESSION: No evidence of contrast extravasation on barium swallow. Electronically Signed   By: Nicoletta Barrier M.D.   On: 12/24/2023 07:08   DG Abd Portable 1V-Small Bowel Obstruction Protocol-initial, 8 hr delay Result Date: 12/22/2023 CLINICAL DATA:  Fall small-bowel obstruction. EXAM: PORTABLE ABDOMEN - 1 VIEW COMPARISON:  Portable abdomen film yesterday at 8:18 a.m. FINDINGS: 7:46 p.m. NGT remains in place with tip in the body of stomach. There is contrast within the nondilated colon through to the rectum, contrast within scattered nondilated small bowel segments. No dilated small bowel is seen at this time. There is no supine evidence of free air. There are cholecystectomy clips and old multilevel lumbar spine fusion construct. There are small bilateral pleural effusions with consolidation or atelectasis in the left greater than right lung bases. Osteopenia and advanced degenerative change lower thoracic spine. IMPRESSION: 1. Contrast within the nondilated colon through to the rectum, contrast within scattered nondilated small bowel segments. No dilated small bowel is seen at this time. 2. NGT tip in the body of stomach. 3. Small bilateral pleural effusions with consolidation or atelectasis in the left greater than right lung bases. Electronically Signed   By: Denman Fischer M.D.   On: 12/22/2023 23:40    Anti-infectives: Anti-infectives (From admission, onward)    Start     Dose/Rate Route Frequency Ordered Stop   12/20/23 1400  cefTRIAXone  (ROCEPHIN ) 1 g in sodium chloride  0.9 % 100 mL IVPB  Status:  Discontinued        1 g 200 mL/hr over  30 Minutes Intravenous Every 24 hours 12/20/23 1109 12/23/23 1409   12/20/23 1200  metroNIDAZOLE  (FLAGYL ) IVPB 500 mg  Status:  Discontinued        500 mg 100 mL/hr over 60 Minutes Intravenous Every 12 hours 12/20/23 1109 12/23/23 1409   12/20/23 0030  piperacillin -tazobactam (ZOSYN ) IVPB 3.375 g  Status:  Discontinued         3.375 g 12.5 mL/hr over 240 Minutes Intravenous Every 8 hours 12/19/23 2333 12/20/23 0730       Assessment/Plan: Gastric pneumatosis - CT 4/18 w/ gastric distension with pneumatosis of the proximal stomach wall, associated PVG and a small area of contained extraluminal air by the GE junction.  - HDS without fever, tachycardia or hypotension. No peritonitis on exam. WBC wnl. No current indication for emergency surgery - Cont IV abx - If Hgb continues to trend, may consider asking GI for EGD - Gastrografin  swallow negative for leak - D/C NG tube   SBO - CT 4/18 also c/f SBO w/ dilated proximal and mid small bowel with slightly decompressed distal small bowel. Hx of hysterectomy for endometrial cancer.  - HDS without fever, tachycardia or hypotension. No peritonitis on exam. WBC wnl. No current indication for emergency surgery - Keep K >=4, Phos >= 3, Mg >= 2 and mobilize for bowel function - Contrast in colon on xray. Pt having bowel function.  - Discontinue NG tube   FEN - clear liquids; advance as tolerated; IVF per primary  VTE - SCDs, on hold, LD Eliquis   4/17 AM ID - Rocpehin/Flagyl    LOS: 4 days    Rella Cardinal 12/24/2023

## 2023-12-24 NOTE — Plan of Care (Signed)
 OOB to chair with therapy. No N&V today. Meal well tolerated.   Problem: Clinical Measurements: Goal: Will remain free from infection Outcome: Progressing   Problem: Activity: Goal: Risk for activity intolerance will decrease Outcome: Progressing   Problem: Coping: Goal: Level of anxiety will decrease Outcome: Progressing   Problem: Safety: Goal: Ability to remain free from injury will improve Outcome: Progressing   Problem: Skin Integrity: Goal: Risk for impaired skin integrity will decrease Outcome: Progressing   Problem: Skin Integrity: Goal: Risk for impaired skin integrity will decrease Outcome: Progressing

## 2023-12-24 NOTE — Care Management Important Message (Signed)
 Important Message  Patient Details  Name: [name] MRN: [id number] Date of Birth: [birth year]   Important Message Given:  Yes - Medicare IM     Wynonia Hedges 12/24/2023, 2:59 PM

## 2023-12-24 NOTE — Progress Notes (Signed)
 PROGRESS NOTE    [name]  FAO:[id number] DOB: 1941-02-06 DOA: 12/16/2023 PCP: Windell Hasty, DO   Brief Narrative:  [name] is a 83 y.o. female with medical history significant of hypertension, hyperlipidemia, GERD, uterine cancer, PE, DVT presenting with focal neurologic deficits.   Patient previously medically stable for discharge, awaiting SNF authorization and bed approval.  Unfortunately on the 17th patient had notably worsening nausea vomiting found to have acute onset ileus.  Discharge currently on hold pending further evaluation as below.  Assessment & Plan:   Principal Problem:   Focal neurological deficit Active Problems:   Malignant neoplasm of corpus uteri, except isthmus (HCC)   Right leg DVT (HCC)   Hx of pulmonary embolus   Essential hypertension   Mixed hyperlipidemia   Gastroesophageal reflux disease without esophagitis   Adynamic ileus (HCC)  Acute onset ileus Intractable nausea vomiting, improving Rule out GI bleed - Surgery following, no indication for intervention at this time - CT abdomen pelvis with contrast on the 20th shows contrast through to the rectum - Tolerated clamping trial well - NG discontinued 4/22 - will advance diet very slowly to ensure tolerance over the next week - Discussed with GI - no indication for EGD/intervention at this time. - Follow H/H - downtrend likely due to prior bleed, phlebotomy, partial hemodilution - Discontinue antibiotics ceftriaxone , Flagyl  (4/21) -no signs or symptoms of infection at this time - Twice daily PPI ongoing  Acute CVA, acute punctate bilateral parietal cortical infarcts, POA Dysphagia, resolving - Facial droop and slurred speech at intake, resolved - Image confirms acute punctate infarcts, neurology consulted - recommend Eliquis  5 bid once above resolves - PT/OT/speech per protocol - pending DC to SNF -Echo: EF 60-65%; grade 1 diastolic dysfunction, without obvious shunt   Diarrhea,  resolved Hypokalemia Leukocytosis questionably reactive versus above Lactic acidosis, resolved - Unspecified source for diarrhea, now resolved; Cdiff negative - Leukocytosis resolved, antibiotics discontinued - Potassium supplementation as indicated ( today), likely from GI losses - Lactic acidosis minimal, resolved  Syncope, likely vagal Orthostatic hypotension  - Resolving after IVF - transition off IVF as PO intake improves (50cc/LR ongoing) - Echocardiogram with EF 60 to 65% normal LV function with grade 1 diastolic dysfunction, without notable shunt  Hypertension, currently hypotensive - Hold propranolol /furosemide    Hyperlipidemia - Continue rosuvastatin    History of PE and DVT - Eliquis  on hold as above, new dose 5 mg twice daily given discussion with pharmacy/Neurology in the setting of acute CVA   History of uterine cancer - Noted   DVT prophylaxis:   Code Status:   Code Status: Full Code Family Communication: None present  Status is: Inpatient  Dispo: The patient is from: Home              Anticipated d/c is to: To be determined              Anticipated d/c date is: 24 to 48 hours              Patient currently not medically stable for discharge  Consultants:  Neurology General Surgery *Sideline discussion with GI; no formal consult  Procedures:  None  Antimicrobials:  Discontinued  Subjective: No acute issues or events overnight -remains anxious at baseline,tolerating PO well, denies nausea vomiting diarrhea constipation headache fevers chills or chest pain  Objective: Vitals:   12/23/23 1611 12/23/23 2050 12/24/23 0000 12/24/23 0447  BP: 134/71 132/71 (!) 138/59 136/65  Pulse: 94 88 82  77  Resp: 14 16 17 18   Temp: 97.9 F (36.6 C) 98 F (36.7 C) 97.6 F (36.4 C) 97.9 F (36.6 C)  TempSrc: Oral Oral Oral Oral  SpO2: 94% 94% 94% 94%  Weight:      Height:        Intake/Output Summary (Last 24 hours) at 12/24/2023 0750 Last data filed  at 12/24/2023 0313 Gross per 24 hour  Intake 538.62 ml  Output 175 ml  Net 363.62 ml   Filed Weights   12/17/23 0416  Weight: 73.5 kg    Examination:  General:  Pleasantly resting in bed, No acute distress. HEENT: NG tube, clamped noted with dark brown fluid. Neck:  Without mass or deformity.  Trachea is midline. Lungs:  Clear to auscultate bilaterally without rhonchi, wheeze, or rales. Heart:  Regular rate and rhythm.  Without murmurs, rubs, or gallops. Abdomen: Nondistended, without guarding or rebound. Extremities: Without cyanosis, clubbing, edema, or obvious deformity. Skin:  Warm and dry, no erythema.  Data Reviewed: I have personally reviewed following labs and imaging studies  CBC: Recent Labs  Lab 12/20/23 0545 12/21/23 0614 12/22/23 0759 12/23/23 0612 12/24/23 0552  WBC 20.6* 15.2* 11.3* 10.1 10.7*  HGB 13.5 11.1* 10.8* 10.5* 9.7*  HCT 41.3 32.8* 33.1* 32.3* 29.3*  MCV 97.4 96.8 97.1 97.9 98.3  PLT 186 192 234 254 259   Basic Metabolic Panel: Recent Labs  Lab 12/19/23 2314 12/21/23 0614 12/22/23 0759 12/23/23 0612 12/24/23 0552  NA 137 139 143 145 140  K 3.6 3.3* 2.9* 2.9* 3.2*  CL 99 103 101 102 103  CO2 28 26 28 28 27   GLUCOSE 206* 90 85 80 97  BUN 18 21 17 14 11   CREATININE 0.98 0.72 0.75 0.74 0.64  CALCIUM  8.5* 7.6* 7.8* 8.0* 7.4*   GFR: Estimated Creatinine Clearance: 53.2 mL/min (by C-G formula based on SCr of 0.64 mg/dL). Liver Function Tests: Recent Labs  Lab 12/19/23 2314  AST 28  ALT 16  ALKPHOS 19*  BILITOT 0.8  PROT 6.4*  ALBUMIN  2.5*    Coagulation Profile: No results for input(s): "INR", "PROTIME" in the last 168 hours.  CBG: Recent Labs  Lab 12/22/23 1913 12/23/23 0031 12/23/23 0622 12/23/23 1214 12/23/23 1805  GLUCAP 74 76 72 90 141*   Recent Results (from the past 240 hours)  C Difficile Quick Screen w PCR reflex     Status: None   Collection Time: 12/18/23  8:20 PM   Specimen: STOOL  Result Value Ref  Range Status   C Diff antigen NEGATIVE NEGATIVE Final   C Diff toxin NEGATIVE NEGATIVE Final   C Diff interpretation No C. difficile detected.  Final    Comment: Performed at [facility], [address], [place], [place] [address]  Gastrointestinal Panel by PCR , Stool     Status: None   Collection Time: 12/18/23  8:20 PM   Specimen: Stool  Result Value Ref Range Status   Campylobacter species NOT DETECTED NOT DETECTED Final   Plesimonas shigelloides NOT DETECTED NOT DETECTED Final   Salmonella species NOT DETECTED NOT DETECTED Final   Yersinia enterocolitica NOT DETECTED NOT DETECTED Final   Vibrio species NOT DETECTED NOT DETECTED Final   Vibrio cholerae NOT DETECTED NOT DETECTED Final   Enteroaggregative E coli (EAEC) NOT DETECTED NOT DETECTED Final   Enteropathogenic E coli (EPEC) NOT DETECTED NOT DETECTED Final   Enterotoxigenic E coli (ETEC) NOT DETECTED NOT DETECTED Final   Shiga  like toxin producing E coli (STEC) NOT DETECTED NOT DETECTED Final   Shigella/Enteroinvasive E coli (EIEC) NOT DETECTED NOT DETECTED Final   Cryptosporidium NOT DETECTED NOT DETECTED Final   Cyclospora cayetanensis NOT DETECTED NOT DETECTED Final   Entamoeba histolytica NOT DETECTED NOT DETECTED Final   Giardia lamblia NOT DETECTED NOT DETECTED Final   Adenovirus F40/41 NOT DETECTED NOT DETECTED Final   Astrovirus NOT DETECTED NOT DETECTED Final   Norovirus GI/GII NOT DETECTED NOT DETECTED Final   Rotavirus A NOT DETECTED NOT DETECTED Final   Sapovirus (I, II, IV, and V) NOT DETECTED NOT DETECTED Final    Comment: Performed at [facility], [address] [place], [place] [address]         Radiology Studies: DG ESOPHAGUS W SINGLE CM (SOL OR THIN BA) Result Date: 12/24/2023 CLINICAL DATA:  83 year old female admitted one week ago with small bowel obstruction with CT evidence of gastric pneumatosis, small volume extraluminal air at the gastroesophageal junction. Barium swallow  requested. EXAM: ESOPHAGUS/BARIUM SWALLOW/TABLET STUDY TECHNIQUE: Single contrast examination was performed using Omnipaque  300. This exam was performed by Quintin Buckle PA-C, and was supervised and interpreted by Marland Silvas, MD. FLUOROSCOPY: Radiation Exposure Index (as provided by the fluoroscopic device): 22.0 mGy Kerma COMPARISON:  CT ABDOMEN PEVIS 12/20/23 FINDINGS: Patient able to swallow 100 mL of Omnipaque  300 without issue. Esophagus intact, normal contour and course. Contrast empties into the stomach. No evidence of contrast extravasation. IMPRESSION: No evidence of contrast extravasation on barium swallow. Electronically Signed   By: Nicoletta Barrier M.D.   On: 12/24/2023 07:08   DG Abd Portable 1V-Small Bowel Obstruction Protocol-initial, 8 hr delay Result Date: 12/22/2023 CLINICAL DATA:  Fall small-bowel obstruction. EXAM: PORTABLE ABDOMEN - 1 VIEW COMPARISON:  Portable abdomen film yesterday at 8:18 a.m. FINDINGS: 7:46 p.m. NGT remains in place with tip in the body of stomach. There is contrast within the nondilated colon through to the rectum, contrast within scattered nondilated small bowel segments. No dilated small bowel is seen at this time. There is no supine evidence of free air. There are cholecystectomy clips and old multilevel lumbar spine fusion construct. There are small bilateral pleural effusions with consolidation or atelectasis in the left greater than right lung bases. Osteopenia and advanced degenerative change lower thoracic spine. IMPRESSION: 1. Contrast within the nondilated colon through to the rectum, contrast within scattered nondilated small bowel segments. No dilated small bowel is seen at this time. 2. NGT tip in the body of stomach. 3. Small bilateral pleural effusions with consolidation or atelectasis in the left greater than right lung bases. Electronically Signed   By: Denman Fischer M.D.   On: 12/22/2023 23:40   Scheduled Meds:  lidocaine   1 patch Transdermal Q24H    pantoprazole  (PROTONIX ) IV  40 mg Intravenous Q12H   rosuvastatin   20 mg Oral Daily   sodium chloride  flush  3 mL Intravenous Q12H   Continuous Infusions:  promethazine  (PHENERGAN ) injection (IM or IVPB) 5 mL/hr at 12/24/23 0313      LOS: 4 days   Time spent:  Haydee Lipa, DO [facility]  If 7PM-7AM, please contact night-coverage [email or web address]  12/24/2023, 7:50 AM

## 2023-12-24 NOTE — Plan of Care (Signed)

## 2023-12-24 NOTE — Progress Notes (Signed)
 Physical Therapy Treatment Patient Details Name: [name] MRN: [id number] DOB: [birth year] Today's Date: 12/24/2023   History of Present Illness 83 y.o. female presents to [facility] hospital on 12/16/2023 after a syncopal episode and fall. After the fall pt was noted to have slurred speech and facial droop. MRI notable for bilateral parietal infarcts. PMH includes HTN, HLD, GERD, uterine CA, PE, DVT.    PT Comments  Patient resting in recliner, appears to be in good spirits, continues to c/o chest pain with minimal activity but agreeable to mobilize. Supervision for safety with cues for technique with hand position. Pt stable in static standing with RW for support. HR in 100's with sit<>stand and pt amb ~150' with RW and CGA for safety. Pt required 2 standing rest breaks due to fatigue, HR in 120's-130's with gait and recovered to 100's once seated. EOS pt agreeable to remain OOB in recliner. Will continue to progress pt as able during stay.    If plan is discharge home, recommend the following: A little help with walking and/or transfers;Assistance with cooking/housework;Assist for transportation;Help with stairs or ramp for entrance   Can travel by private vehicle        Equipment Recommendations  None recommended by PT    Recommendations for Other Services       Precautions / Restrictions Precautions Precautions: Fall Recall of Precautions/Restrictions: Intact Precaution/Restrictions Comments: reports chest pain from fall but states its improved from initial pain Restrictions Weight Bearing Restrictions Per Provider Order: No Other Position/Activity Restrictions: NG tube     Mobility  Bed Mobility               General bed mobility comments: pt OOB in recliner    Transfers Overall transfer level: Needs assistance Equipment used: None Transfers: Sit to/from Stand, Bed to chair/wheelchair/BSC Sit to Stand: Supervision   Step pivot transfers: Supervision        General transfer comment: sup for safety with sit<>stand from recliner. pt required cues for hand placement.    Ambulation/Gait Ambulation/Gait assistance: Contact guard assist, Supervision Gait Distance (Feet): 150 Feet Assistive device: Rolling walker (2 wheels) Gait Pattern/deviations: Step-through pattern, Decreased step length - left, Decreased step length - right Gait velocity: decr     General Gait Details: CGA for safety, pt maintained safe proximity to RW. no overt LOB, 2x standing rest throughout. HR in 120-130's with gait and recovered to 100's with seated rest.   Stairs             Wheelchair Mobility     Tilt Bed    Modified Rankin (Stroke Patients Only)       Balance Overall balance assessment: Needs assistance Sitting-balance support: No upper extremity supported, Feet supported Sitting balance-Leahy Scale: Good     Standing balance support: No upper extremity supported, During functional activity Standing balance-Leahy Scale: Fair Standing balance comment: no overt LOB.                            Communication Communication Communication: No apparent difficulties  Cognition Arousal: Alert Behavior During Therapy: WFL for tasks assessed/performed, Anxious (fearful of possibility of passing out)   PT - Cognitive impairments: No apparent impairments, No family/caregiver present to determine baseline                         Following commands: Intact      Cueing Cueing Techniques: Verbal cues  Exercises      General Comments        Pertinent Vitals/Pain Pain Assessment Pain Assessment: Faces Faces Pain Scale: Hurts a little bit Pain Location: chest Pain Descriptors / Indicators: Discomfort, Sore Pain Intervention(s): Limited activity within patient's tolerance, Monitored during session, Repositioned (pain patch on chest)    Home Living                          Prior Function            PT Goals  (current goals can now be found in the care plan section) Acute Rehab PT Goals Patient Stated Goal: to reduce pain at chest when mobilizing PT Goal Formulation: With patient Time For Goal Achievement: 12/31/23 Potential to Achieve Goals: Good Progress towards PT goals: Progressing toward goals    Frequency    Min 3X/week      PT Plan      Co-evaluation              AM-PAC PT "6 Clicks" Mobility   Outcome Measure  Help needed turning from your back to your side while in a flat bed without using bedrails?: A Little Help needed moving from lying on your back to sitting on the side of a flat bed without using bedrails?: A Little Help needed moving to and from a bed to a chair (including a wheelchair)?: A Little Help needed standing up from a chair using your arms (e.g., wheelchair or bedside chair)?: A Little Help needed to walk in hospital room?: A Little Help needed climbing 3-5 steps with a railing? : A Little 6 Click Score: 18    End of Session Equipment Utilized During Treatment: Gait belt Activity Tolerance: Patient tolerated treatment well Patient left: with call bell/phone within reach;in chair;with chair alarm set Nurse Communication: Mobility status PT Visit Diagnosis: Other abnormalities of gait and mobility (R26.89);Muscle weakness (generalized) (M62.81);Pain     Time: 1610-9604 PT Time Calculation (min) (ACUTE ONLY): 27 min  Charges:    $Gait Training: 8-22 mins $Therapeutic Activity: 8-22 mins PT General Charges $$ ACUTE PT VISIT: 1 Visit                     Tish Forge, DPT Acute Rehabilitation Services Office [phone]  12/24/23 2:47 PM

## 2023-12-24 NOTE — Progress Notes (Signed)
 Occupational Therapy Treatment Patient Details Name: [name] MRN: [id number] DOB: 19-Apr-1941 Today's Date: 12/24/2023   History of present illness 83 y.o. female presents to [facility] hospital on 12/16/2023 after a syncopal episode and fall. After the fall pt was noted to have slurred speech and facial droop. MRI notable for bilateral parietal infarcts. PMH includes HTN, HLD, GERD, uterine CA, PE, DVT.   OT comments  Pt making good progress with functional goals. Pt participated in functional mobility HHA with Sup to walk ton bathroom for toileting tasks min A, stood at sink for grooming/hygiene tasks with Sup, LB bathing tasks seated EOB. Pt unable to reach to feet due to chest pain. Pt's HR 97 at rest increasing to 131 with activity walking to bathroom for toileting. Pt recovered to 100 once back to recliner seated. Pt c/o of feeling cold (thermostat in room was set on 75 when therapist checked as requested per pt). OT provided pt with warming blankets at end of session. OT will continue to follow acutely to maximize level of function and safety      If plan is discharge home, recommend the following:  A little help with walking and/or transfers;A little help with bathing/dressing/bathroom;Assistance with cooking/housework;Assist for transportation;Help with stairs or ramp for entrance   Equipment Recommendations  Tub/shower seat    Recommendations for Other Services      Precautions / Restrictions Precautions Precautions: Fall Recall of Precautions/Restrictions: Intact Precaution/Restrictions Comments: reports chest pain from fall but states its improved from initial pain Restrictions Weight Bearing Restrictions Per Provider Order: No       Mobility Bed Mobility               General bed mobility comments: pt in chair upon arrival    Transfers Overall transfer level: Needs assistance Equipment used: None Transfers: Sit to/from Stand, Bed to chair/wheelchair/BSC Sit to  Stand: Contact guard assist, Supervision     Step pivot transfers: Supervision     General transfer comment: stood from recliner CGA, Sup from EOB and commode, HR 97 at rest increasing to 131 with activity walking to bathroom for toileting. Pt recovered to 100 once back to recliner seated     Balance Overall balance assessment: Needs assistance Sitting-balance support: No upper extremity supported, Feet supported Sitting balance-Leahy Scale: Good     Standing balance support: No upper extremity supported, During functional activity Standing balance-Leahy Scale: Fair                             ADL either performed or assessed with clinical judgement   ADL Overall ADL's : Needs assistance/impaired     Grooming: Wash/dry hands;Wash/dry face;Supervision/safety;Standing       Lower Body Bathing: Minimal assistance;Sitting/lateral leans       Lower Body Dressing: Maximal assistance Lower Body Dressing Details (indicate cue type and reason): assist to don socks, unable to reach feet due to pain Toilet Transfer: Supervision/safety;Cueing for safety;Ambulation;Regular Toilet;Grab bars   Toileting- Clothing Manipulation and Hygiene: Minimal assistance;Sit to/from stand Toileting - Clothing Manipulation Details (indicate cue type and reason): min A with posterior hygiene     Functional mobility during ADLs: Contact guard assist;Supervision/safety;Cueing for safety General ADL Comments: unable to reach feet for LB ADL due to pain    Extremity/Trunk Assessment Upper Extremity Assessment Upper Extremity Assessment: Generalized weakness   Lower Extremity Assessment Lower Extremity Assessment: Defer to PT evaluation  Vision Baseline Vision/History: 1 Wears glasses Ability to See in Adequate Light: 0 Adequate Patient Visual Report: No change from baseline     Perception     Praxis     Communication Communication Communication: No apparent difficulties    Cognition Arousal: Alert Behavior During Therapy: WFL for tasks assessed/performed, Anxious                                 Following commands: Intact        Cueing   Cueing Techniques: Verbal cues  Exercises      Shoulder Instructions       General Comments      Pertinent Vitals/ Pain       Pain Assessment Pain Assessment: Faces Faces Pain Scale: Hurts a little bit Pain Location: chest Pain Descriptors / Indicators: Discomfort, Sore Pain Intervention(s): Monitored during session, Limited activity within patient's tolerance, Repositioned  Home Living                                          Prior Functioning/Environment              Frequency  Min 2X/week        Progress Toward Goals  OT Goals(current goals can now be found in the care plan section)  Progress towards OT goals: Progressing toward goals     Plan      Co-evaluation                 AM-PAC OT "6 Clicks" Daily Activity     Outcome Measure   Help from another person eating meals?: None Help from another person taking care of personal grooming?: A Little Help from another person toileting, which includes using toliet, bedpan, or urinal?: A Little Help from another person bathing (including washing, rinsing, drying)?: A Little Help from another person to put on and taking off regular upper body clothing?: A Little Help from another person to put on and taking off regular lower body clothing?: A Lot 6 Click Score: 18    End of Session Equipment Utilized During Treatment: Gait belt  OT Visit Diagnosis: Unsteadiness on feet (R26.81);Muscle weakness (generalized) (M62.81);History of falling (Z91.81);Pain Pain - part of body:  (chest)   Activity Tolerance Patient limited by fatigue;Patient limited by pain   Patient Left in chair;with call bell/phone within reach;with chair alarm set   Nurse Communication Mobility status        Time:  9562-1308 OT Time Calculation (min): 19 min  Charges: OT General Charges $OT Visit: 1 Visit OT Treatments $Self Care/Home Management : 8-22 mins    Alfred Ann 12/24/2023, 1:56 PM

## 2023-12-25 DIAGNOSIS — Z7901 Long term (current) use of anticoagulants: Secondary | ICD-10-CM | POA: Diagnosis not present

## 2023-12-25 DIAGNOSIS — K56609 Unspecified intestinal obstruction, unspecified as to partial versus complete obstruction: Secondary | ICD-10-CM

## 2023-12-25 DIAGNOSIS — R0789 Other chest pain: Secondary | ICD-10-CM

## 2023-12-25 DIAGNOSIS — R29810 Facial weakness: Secondary | ICD-10-CM

## 2023-12-25 DIAGNOSIS — I639 Cerebral infarction, unspecified: Secondary | ICD-10-CM | POA: Diagnosis not present

## 2023-12-25 DIAGNOSIS — R29818 Other symptoms and signs involving the nervous system: Secondary | ICD-10-CM | POA: Diagnosis not present

## 2023-12-25 LAB — CBC
HCT: 31.6 % — ABNORMAL LOW (ref 36.0–46.0)
Hemoglobin: 10.3 g/dL — ABNORMAL LOW (ref 12.0–15.0)
MCH: 32.1 pg (ref 26.0–34.0)
MCHC: 32.6 g/dL (ref 30.0–36.0)
MCV: 98.4 fL (ref 80.0–100.0)
Platelets: 246 10*3/uL (ref 150–400)
RBC: 3.21 MIL/uL — ABNORMAL LOW (ref 3.87–5.11)
RDW: 14.6 % (ref 11.5–15.5)
WBC: 8.9 10*3/uL (ref 4.0–10.5)
nRBC: 0.2 % (ref 0.0–0.2)

## 2023-12-25 LAB — GLUCOSE, CAPILLARY
Glucose-Capillary: 82 mg/dL (ref 70–99)
Glucose-Capillary: 183 mg/dL — ABNORMAL HIGH (ref 70–99)
Glucose-Capillary: 116 mg/dL — ABNORMAL HIGH (ref 70–99)

## 2023-12-25 LAB — BASIC METABOLIC PANEL WITH GFR
Anion gap: 9 (ref 5–15)
BUN: 7 mg/dL — ABNORMAL LOW (ref 8–23)
CO2: 26 mmol/L (ref 22–32)
Calcium: 7.5 mg/dL — ABNORMAL LOW (ref 8.9–10.3)
Chloride: 103 mmol/L (ref 98–111)
Creatinine, Ser: 0.56 mg/dL (ref 0.44–1.00)
GFR, Estimated: >60 mL/min (ref >60)
Glucose, Bld: 90 mg/dL (ref 70–99)
Potassium: 3.8 mmol/L (ref 3.5–5.1)
Sodium: 138 mmol/L (ref 135–145)

## 2023-12-25 MED ORDER — METOPROLOL TARTRATE 12.5 MG HALF TABLET
12.5000 mg | ORAL_TABLET | Freq: Two times a day (BID) | ORAL | Status: DC
  Administered 2023-12-25 – 2023-12-27 (×5): 12.5 mg via ORAL
  Filled 2023-12-25 (×5): qty 1

## 2023-12-25 NOTE — Plan of Care (Signed)
  Problem: Clinical Measurements: Goal: Respiratory complications will improve Outcome: Progressing Goal: Cardiovascular complication will be avoided Outcome: Progressing   Problem: Activity: Goal: Risk for activity intolerance will decrease Outcome: Progressing   Problem: Coping: Goal: Level of anxiety will decrease Outcome: Progressing   Problem: Elimination: Goal: Will not experience complications related to bowel motility Outcome: Progressing Goal: Will not experience complications related to urinary retention Outcome: Progressing   Problem: Skin Integrity: Goal: Risk for impaired skin integrity will decrease Outcome: Progressing   Problem: Education: Goal: Knowledge of disease or condition will improve Outcome: Progressing   Problem: Coping: Goal: Will verbalize positive feelings about self Outcome: Progressing   Problem: Nutrition: Goal: Risk of aspiration will decrease Outcome: Progressing   Problem: Nutritional: Goal: Maintenance of adequate nutrition will improve Outcome: Progressing

## 2023-12-25 NOTE — Plan of Care (Signed)

## 2023-12-25 NOTE — Progress Notes (Signed)
 Subjective: CC: Tolerating cld without abdominal pain, n/v. Large non-bloody bm yesterday per patient report. Has not required prn pain or anti-nausea medication since we last saw her yesterday.   Afebrile. No tachycardia or systolic hypotension. WBC wnl. Hgb stable at 10.3    Objective: Vital signs in last 24 hours: Temp:  [98 F (36.7 C)-99.9 F (37.7 C)] 98.4 F (36.9 C) (04/23 0712) Pulse Rate:  [81-89] 89 (04/23 0712) Resp:  [16-20] 16 (04/23 0712) BP: (137-142)/(58-68) 140/58 (04/23 0712) SpO2:  [94 %-98 %] 95 % (04/23 0712) Last BM Date : 12/24/23  Intake/Output from previous day: 04/22 0701 - 04/23 0700 In: -  Out: 550 [Urine:550] Intake/Output this shift: No intake/output data recorded.  PE: Gen:  Alert, NAD, pleasant Abd: Soft, ND, NT, +BS  Lab Results:  Recent Labs    12/24/23 0552 12/25/23 0601  WBC 10.7* 8.9  HGB 9.7* 10.3*  HCT 29.3* 31.6*  PLT 259 246   BMET Recent Labs    12/24/23 0552 12/25/23 0601  NA 140 138  K 3.2* 3.8  CL 103 103  CO2 27 26  GLUCOSE 97 90  BUN 11 7*  CREATININE 0.64 0.56  CALCIUM  7.4* 7.5*   PT/INR No results for input(s): "LABPROT", "INR" in the last 72 hours. CMP     Component Value Date/Time   NA 138 12/25/2023 0601   NA 142 07/23/2022 0850   NA 140 02/28/2017 1310   K 3.8 12/25/2023 0601   K 4.3 02/28/2017 1310   CL 103 12/25/2023 0601   CO2 26 12/25/2023 0601   CO2 27 02/28/2017 1310   GLUCOSE 90 12/25/2023 0601   GLUCOSE 93 02/28/2017 1310   BUN 7 (L) 12/25/2023 0601   BUN 13 07/23/2022 0850   BUN 13.9 02/28/2017 1310   CREATININE 0.56 12/25/2023 0601   CREATININE 0.8 02/28/2017 1310   CALCIUM  7.5 (L) 12/25/2023 0601   CALCIUM  10.1 02/28/2017 1310   PROT 6.4 (L) 12/19/2023 2314   PROT 6.7 02/28/2017 1310   ALBUMIN  2.5 (L) 12/19/2023 2314   ALBUMIN  3.7 02/28/2017 1310   AST 28 12/19/2023 2314   AST 24 02/28/2017 1310   ALT 16 12/19/2023 2314   ALT 14 02/28/2017 1310   ALKPHOS 19  (L) 12/19/2023 2314   ALKPHOS 35 (L) 02/28/2017 1310   BILITOT 0.8 12/19/2023 2314   BILITOT 0.53 02/28/2017 1310   GFRNONAA >60 12/25/2023 0601   GFRAA >60 05/04/2016 0309   Lipase     Component Value Date/Time   LIPASE 28 12/19/2023 2314    Studies/Results: DG ESOPHAGUS W SINGLE CM (SOL OR THIN BA) Result Date: 12/24/2023 CLINICAL DATA:  83 year old female admitted one week ago with small bowel obstruction with CT evidence of gastric pneumatosis, small volume extraluminal air at the gastroesophageal junction. Barium swallow requested. EXAM: ESOPHAGUS/BARIUM SWALLOW/TABLET STUDY TECHNIQUE: Single contrast examination was performed using Omnipaque  300. This exam was performed by Quintin Buckle PA-C, and was supervised and interpreted by Marland Silvas, MD. FLUOROSCOPY: Radiation Exposure Index (as provided by the fluoroscopic device): 22.0 mGy Kerma COMPARISON:  CT ABDOMEN PEVIS 12/20/23 FINDINGS: Patient able to swallow 100 mL of Omnipaque  300 without issue. Esophagus intact, normal contour and course. Contrast empties into the stomach. No evidence of contrast extravasation. IMPRESSION: No evidence of contrast extravasation on barium swallow. Electronically Signed   By: Nicoletta Barrier M.D.   On: 12/24/2023 07:08    Anti-infectives: Anti-infectives (From admission, onward)  Start     Dose/Rate Route Frequency Ordered Stop   12/20/23 1400  cefTRIAXone  (ROCEPHIN ) 1 g in sodium chloride  0.9 % 100 mL IVPB  Status:  Discontinued        1 g 200 mL/hr over 30 Minutes Intravenous Every 24 hours 12/20/23 1109 12/23/23 1409   12/20/23 1200  metroNIDAZOLE  (FLAGYL ) IVPB 500 mg  Status:  Discontinued        500 mg 100 mL/hr over 60 Minutes Intravenous Every 12 hours 12/20/23 1109 12/23/23 1409   12/20/23 0030  piperacillin -tazobactam (ZOSYN ) IVPB 3.375 g  Status:  Discontinued        3.375 g 12.5 mL/hr over 240 Minutes Intravenous Every 8 hours 12/19/23 2333 12/20/23 0730         Assessment/Plan Gastric pneumatosis - CT 4/18 w/ gastric distension with pneumatosis of the proximal stomach wall, associated PVG and a small area of contained extraluminal air by the GE junction.  - HDS without fever, tachycardia or systolic hypotension. No peritonitis on exam. WBC wnl. No current indication for emergency surgery - Gastrografin  swallow negative for leak - Was on abx. This has been dc'd by primary  - Previously with bloody NGT output. On PPI. Primary spoke with GI. Hgb stable.  - Okay to advance diet as tolerated   SBO - CT 4/18 also c/f SBO w/ dilated proximal and mid small bowel with slightly decompressed distal small bowel. Hx of hysterectomy for endometrial cancer.  - HDS without fever, tachycardia or hypotension. No peritonitis on exam. WBC wnl. No current indication for emergency surgery - Keep K >=4, Phos >= 3, Mg >= 2 and mobilize for bowel function - Contrast in colon on xray. Pt tolerating cld and having bowel function.  - Okay to adv diet as tolerated   FEN - FLD, ADAT to soft. IVF per primary  VTE - SCDs, on hold, LD Eliquis   4/17 AM - if tolerates soft diet, okay to resume anticoagulation from our standpoint ID - Rocpehin/Flagyl  4/17 - 4/21 Plan - Advance diet as tolerated. If tolerates diet advancement, our team will sign off. Please call back with any questions or concerns.   I reviewed nursing notes, hospitalist notes, last 24 h vitals and pain scores, last 48 h intake and output, last 24 h labs and trends, and last 24 h imaging results.   LOS: 5 days    Delton Filbert, [facility] 12/25/2023, 9:11 AM Please see Amion for pager number during day hours 7:00am-4:30pm

## 2023-12-25 NOTE — Progress Notes (Signed)
 [facility]                                                                              [name], is a 83 y.o. female, DOB - Jan 28, 1941, ZOX:[id number] Admit date - 12/16/2023    Outpatient Primary MD for the patient is Julia Hasty, DO  LOS - 5  days  No chief complaint on file.      Brief summary    83 y.o. female with medical history significant of hypertension, hyperlipidemia, GERD, uterine cancer, PE, DVT presenting with focal neurologic deficits.    Patient previously medically stable for discharge, awaiting SNF authorization and bed approval.  Unfortunately on the 17th patient had notably worsening nausea vomiting found to have acute onset ileus vs SBO.    Assessment & Plan    Gastric pneumatosis - CT 4/18 showed gastric distention with pneumatosis of the proximal stomach wall, associated PEG and small area of contained extraluminal air at the GE junction - General Surgery consulted, Gastrografin  swallow negative for leak, - Previous hospitalist, Dr Rudine Cos discussed with GI, no indication for EGD/intervention at this time.  Antibiotics were discontinued on 4/21. - On PPI - Per surgery, okay to advance diet as tolerated-advance to full liquids   Small bowel obstruction CT 4/18 showed SBO with dilated proximal and mid small bowel with slightly decompressed distal small bowel.  Patient has prior history of hysterectomy for endometrial CA.  No peritonitis on exam. -Surgery following, no current indications for emergency surgery.  Contrast in colon on x-ray, patient has tolerated clear liquid diet - Diet advance to full liquids.    Acute CVA, acute punctate bilateral parietal cortical infarcts, POA Dysphagia, resolving - Facial droop and slurred speech at intake, resolved - Image confirms acute punctate infarcts, neurology consulted - recommend Eliquis  5 bid once above resolves - PT/OT/SLP -Echo: EF 60-65%; grade 1 diastolic dysfunction, without  obvious shunt -PT recommended SNF   Diarrhea, resolved Leukocytosis, lactic acidosis resolved - Unspecified source for diarrhea, now resolved; Cdiff negative - Leukocytosis resolved, antibiotics discontinued - Lactic acidosis resolved  Hypokalemia -Likely due to GI losses, replaced   Syncope, likely vagal Orthostatic hypotension  - Resolving after IVF - Echocardiogram with EF 60 to 65% normal LV function with grade 1 diastolic dysfunction, without notable shunt   Hypertension, currently hypotensive - propranolol  and furosemide  were placed on hold - BP now improving, will place on Lopressor  12.5 mg twice daily   Hyperlipidemia - Continue rosuvastatin    History of PE and DVT - Eliquis  on hold as above, new dose 5 mg twice daily as per neurology recommendations in the setting of acute CVA, once tolerating diet consistently   History of uterine cancer - Noted, outpatient follow-up    Estimated body mass index is 27.81 kg/m as calculated from the following:   Height as of this encounter: 5\' 4"  (1.626 m).   Weight as of this encounter: 73.5 kg.  Code Status:  DVT Prophylaxis:  Place and maintain sequential compression device Start: 12/24/23 2240   Level of Care: Level of care: Med-Surg Family Communication: Updated patient Disposition Plan:  Remains inpatient appropriate:      Procedures:    Consultants:   General Surgery Neurology  Antimicrobials:   Anti-infectives (From admission, onward)    Start     Dose/Rate Route Frequency Ordered Stop   12/20/23 1400  cefTRIAXone  (ROCEPHIN ) 1 g in sodium chloride  0.9 % 100 mL IVPB  Status:  Discontinued        1 g 200 mL/hr over 30 Minutes Intravenous Every 24 hours 12/20/23 1109 12/23/23 1409   12/20/23 1200  metroNIDAZOLE  (FLAGYL ) IVPB 500 mg  Status:  Discontinued        500 mg 100 mL/hr over 60 Minutes Intravenous Every 12 hours 12/20/23 1109 12/23/23 1409   12/20/23 0030  piperacillin -tazobactam (ZOSYN ) IVPB  3.375 g  Status:  Discontinued        3.375 g 12.5 mL/hr over 240 Minutes Intravenous Every 8 hours 12/19/23 2333 12/20/23 0730          Medications  insulin  aspart  0-5 Units Subcutaneous QHS   insulin  aspart  0-6 Units Subcutaneous TID WC   lidocaine   1 patch Transdermal Q24H   pantoprazole  (PROTONIX ) IV  40 mg Intravenous Q12H   rosuvastatin   20 mg Oral Daily   sodium chloride  flush  3 mL Intravenous Q12H      Subjective:   [name] was seen and examined today.  Feeling better, sitting upright and tolerating clear liquid diet no acute issues overnight.  States large BM yesterday.  Patient denies dizziness, chest pain, shortness of breath, abdominal pain, N/V Objective:   Vitals:   12/24/23 2343 12/25/23 0355 12/25/23 0712 12/25/23 1117  BP: (!) 142/61 (!) 140/63 (!) 140/58 135/78  Pulse: 81 82 89 84  Resp: 18 18 16 20   Temp: 98.6 F (37 C) 98.1 F (36.7 C) 98.4 F (36.9 C) 99.5 F (37.5 C)  TempSrc: Oral  Oral Oral  SpO2: 94% 96% 95% 97%  Weight:      Height:        Intake/Output Summary (Last 24 hours) at 12/25/2023 1206 Last data filed at 12/24/2023 2343 Gross per 24 hour  Intake --  Output 550 ml  Net -550 ml     Wt Readings from Last 3 Encounters:  12/17/23 73.5 kg  12/20/22 76.2 kg  11/16/22 77.7 kg     Exam General: Alert and oriented x 3, NAD Cardiovascular: S1 S2 auscultated,  RRR Respiratory: Clear to auscultation bilaterally, no wheezing Gastrointestinal: Soft, nontender, nondistended, + bowel sounds Ext: no pedal edema bilaterally Neuro: no new deficits Psych: Normal affect     Data Reviewed:  I have personally reviewed following labs    CBC Lab Results  Component Value Date   WBC 8.9 12/25/2023   RBC 3.21 (L) 12/25/2023   HGB 10.3 (L) 12/25/2023   HCT 31.6 (L) 12/25/2023   MCV 98.4 12/25/2023   MCH 32.1 12/25/2023   PLT 246 12/25/2023   MCHC 32.6 12/25/2023   RDW 14.6 12/25/2023   LYMPHSABS 0.7 12/16/2023   MONOABS  1.1 (H) 12/16/2023   EOSABS 0.0 12/16/2023   BASOSABS 0.0 12/16/2023     Last metabolic panel Lab Results  Component Value Date   NA 138 12/25/2023   K 3.8 12/25/2023   CL 103 12/25/2023   CO2 26 12/25/2023   BUN 7 (L) 12/25/2023   CREATININE 0.56 12/25/2023   GLUCOSE 90 12/25/2023   GFRNONAA >60 12/25/2023   GFRAA >60 05/04/2016   CALCIUM  7.5 (L) 12/25/2023  PROT 6.4 (L) 12/19/2023   ALBUMIN  2.5 (L) 12/19/2023   BILITOT 0.8 12/19/2023   ALKPHOS 19 (L) 12/19/2023   AST 28 12/19/2023   ALT 16 12/19/2023   ANIONGAP 9 12/25/2023    CBG (last 3)  Recent Labs    12/24/23 2124 12/25/23 0618 12/25/23 1118  GLUCAP 101* 82 183*      Coagulation Profile: No results for input(s): "INR", "PROTIME" in the last 168 hours.   Radiology Studies: I have personally reviewed the imaging studies  DG ESOPHAGUS W SINGLE CM (SOL OR THIN BA) Result Date: 12/24/2023 CLINICAL DATA:  83 year old female admitted one week ago with small bowel obstruction with CT evidence of gastric pneumatosis, small volume extraluminal air at the gastroesophageal junction. Barium swallow requested. EXAM: ESOPHAGUS/BARIUM SWALLOW/TABLET STUDY TECHNIQUE: Single contrast examination was performed using Omnipaque  300. This exam was performed by Quintin Buckle PA-C, and was supervised and interpreted by Marland Silvas, MD. FLUOROSCOPY: Radiation Exposure Index (as provided by the fluoroscopic device): 22.0 mGy Kerma COMPARISON:  CT ABDOMEN PEVIS 12/20/23 FINDINGS: Patient able to swallow 100 mL of Omnipaque  300 without issue. Esophagus intact, normal contour and course. Contrast empties into the stomach. No evidence of contrast extravasation. IMPRESSION: No evidence of contrast extravasation on barium swallow. Electronically Signed   By: Nicoletta Barrier M.D.   On: 12/24/2023 07:08       Aliani Caccavale M.D. [facility] 12/25/2023, 12:06 PM  Available via Epic secure chat 7am-7pm After 7 pm, please refer to night  coverage provider listed on amion.

## 2023-12-26 DIAGNOSIS — W19XXXA Unspecified fall, initial encounter: Secondary | ICD-10-CM | POA: Diagnosis not present

## 2023-12-26 DIAGNOSIS — I639 Cerebral infarction, unspecified: Secondary | ICD-10-CM | POA: Diagnosis not present

## 2023-12-26 DIAGNOSIS — R0789 Other chest pain: Secondary | ICD-10-CM | POA: Diagnosis not present

## 2023-12-26 DIAGNOSIS — R29818 Other symptoms and signs involving the nervous system: Secondary | ICD-10-CM | POA: Diagnosis not present

## 2023-12-26 LAB — CBC
HCT: 30.6 % — ABNORMAL LOW (ref 36.0–46.0)
Hemoglobin: 9.9 g/dL — ABNORMAL LOW (ref 12.0–15.0)
MCH: 32.4 pg (ref 26.0–34.0)
MCHC: 32.4 g/dL (ref 30.0–36.0)
MCV: 100 fL (ref 80.0–100.0)
Platelets: 230 10*3/uL (ref 150–400)
RBC: 3.06 MIL/uL — ABNORMAL LOW (ref 3.87–5.11)
RDW: 14.5 % (ref 11.5–15.5)
WBC: 9.3 10*3/uL (ref 4.0–10.5)
nRBC: 0 % (ref 0.0–0.2)

## 2023-12-26 LAB — RENAL FUNCTION PANEL
Albumin: 2.1 g/dL — ABNORMAL LOW (ref 3.5–5.0)
Anion gap: 8 (ref 5–15)
BUN: <5 mg/dL — ABNORMAL LOW (ref 8–23)
CO2: 23 mmol/L (ref 22–32)
Calcium: 7.6 mg/dL — ABNORMAL LOW (ref 8.9–10.3)
Chloride: 107 mmol/L (ref 98–111)
Creatinine, Ser: 0.5 mg/dL (ref 0.44–1.00)
GFR, Estimated: >60 mL/min (ref >60)
Glucose, Bld: 95 mg/dL (ref 70–99)
Phosphorus: 2.7 mg/dL (ref 2.5–4.6)
Potassium: 4.3 mmol/L (ref 3.5–5.1)
Sodium: 138 mmol/L (ref 135–145)

## 2023-12-26 LAB — GLUCOSE, CAPILLARY
Glucose-Capillary: 126 mg/dL — ABNORMAL HIGH (ref 70–99)
Glucose-Capillary: 92 mg/dL (ref 70–99)
Glucose-Capillary: 116 mg/dL — ABNORMAL HIGH (ref 70–99)
Glucose-Capillary: 153 mg/dL — ABNORMAL HIGH (ref 70–99)
Glucose-Capillary: 112 mg/dL — ABNORMAL HIGH (ref 70–99)

## 2023-12-26 MED ORDER — APIXABAN 5 MG PO TABS
5.0000 mg | ORAL_TABLET | Freq: Two times a day (BID) | ORAL | Status: DC
  Administered 2023-12-26 – 2023-12-27 (×3): 5 mg via ORAL
  Filled 2023-12-26 (×3): qty 1

## 2023-12-26 MED ORDER — PANTOPRAZOLE SODIUM 40 MG PO TBEC
40.0000 mg | DELAYED_RELEASE_TABLET | Freq: Two times a day (BID) | ORAL | Status: DC
  Administered 2023-12-26 – 2023-12-27 (×2): 40 mg via ORAL
  Filled 2023-12-26 (×2): qty 1

## 2023-12-26 NOTE — Plan of Care (Signed)
  Problem: Education: Goal: Knowledge of General Education information will improve Description: Including pain rating scale, medication(s)/side effects and non-pharmacologic comfort measures Outcome: Progressing   Problem: Clinical Measurements: Goal: Respiratory complications will improve Outcome: Progressing Goal: Cardiovascular complication will be avoided Outcome: Progressing   Problem: Nutrition: Goal: Adequate nutrition will be maintained Outcome: Progressing   Problem: Coping: Goal: Level of anxiety will decrease Outcome: Progressing   Problem: Elimination: Goal: Will not experience complications related to bowel motility Outcome: Progressing Goal: Will not experience complications related to urinary retention Outcome: Progressing   Problem: Skin Integrity: Goal: Risk for impaired skin integrity will decrease Outcome: Progressing

## 2023-12-26 NOTE — Progress Notes (Signed)
 [facility]                                                                              [name], is a 83 y.o. female, DOB - [birth year], GLO:[id number] Admit date - 12/16/2023    Outpatient Primary MD for the patient is Windell Hasty, DO  LOS - 6  days  No chief complaint on file.      Brief summary    83 y.o. female with medical history significant of hypertension, hyperlipidemia, GERD, uterine cancer, PE, DVT presenting with focal neurologic deficits.    Patient previously medically stable for discharge, awaiting SNF authorization and bed approval.  Unfortunately on the 17th patient had notably worsening nausea vomiting found to have acute onset ileus vs SBO.    Assessment & Plan    Gastric pneumatosis - CT 4/18 showed gastric distention with pneumatosis of the proximal stomach wall, associated PEG and small area of contained extraluminal air at the GE junction - General Surgery consulted, Gastrografin  swallow negative for leak, - Previous hospitalist, Dr Rudine Cos discussed with GI, no indication for EGD/intervention at this time.  Antibiotics were discontinued on 4/21. - On PPI - Improved, tolerating liquid diet, diet advanced to soft solids today   Small bowel obstruction CT 4/18 showed SBO with dilated proximal and mid small bowel with slightly decompressed distal small bowel.  Patient has prior history of hysterectomy for endometrial CA.  No peritonitis on exam. -Surgery following, no current indications for emergency surgery.  Contrast in colon on x-ray, patient has tolerated liquid diet - Diet advanced to soft solids today   Acute CVA, acute punctate bilateral parietal cortical infarcts, POA Dysphagia, resolving - Facial droop and slurred speech at intake, resolved - Image confirms acute punctate infarcts, neurology consulted - recommend Eliquis  5 bid once above resolves - PT/OT/SLP -Echo: EF 60-65%; grade 1 diastolic dysfunction, without  obvious shunt -PT recommended SNF -Will resume eliquis  5 mg twice daily today, follow CBC in a.m.   Diarrhea, resolved Leukocytosis, lactic acidosis resolved - Unspecified source for diarrhea, now resolved; Cdiff negative - Leukocytosis resolved, antibiotics discontinued - Lactic acidosis resolved  Hypokalemia -Likely due to GI losses, replaced   Syncope, likely vagal Orthostatic hypotension  - Resolving after IVF - Echocardiogram with EF 60 to 65% normal LV function with grade 1 diastolic dysfunction, without notable shunt   Hypertension, currently hypotensive - propranolol  and furosemide  were placed on hold - BP now improving, will place on Lopressor  12.5 mg twice daily   Hyperlipidemia - Continue rosuvastatin    History of PE and DVT - Eliquis  was placed on hold and neurology recommended new dose of 5 mg twice daily in the setting of acute CVA once tolerating diet  - Resuming Eliquis  5 mg twice daily today   History of uterine cancer - Noted, outpatient follow-up    Estimated body mass index is 27.81 kg/m as calculated from the following:   Height as of this encounter: 5\' 4"  (1.626 m).   Weight as of this encounter: 73.5 kg.  Code Status:  DVT Prophylaxis:  Place and maintain sequential compression device Start: 12/24/23 2240 apixaban  (  ELIQUIS ) tablet 5 mg   Level of Care: Level of care: Med-Surg Family Communication: Updated patient Disposition Plan:      Remains inpatient appropriate:      Procedures:    Consultants:   General Surgery Neurology  Antimicrobials:   Anti-infectives (From admission, onward)    Start     Dose/Rate Route Frequency Ordered Stop   12/20/23 1400  cefTRIAXone  (ROCEPHIN ) 1 g in sodium chloride  0.9 % 100 mL IVPB  Status:  Discontinued        1 g 200 mL/hr over 30 Minutes Intravenous Every 24 hours 12/20/23 1109 12/23/23 1409   12/20/23 1200  metroNIDAZOLE  (FLAGYL ) IVPB 500 mg  Status:  Discontinued        500 mg 100 mL/hr over  60 Minutes Intravenous Every 12 hours 12/20/23 1109 12/23/23 1409   12/20/23 0030  piperacillin -tazobactam (ZOSYN ) IVPB 3.375 g  Status:  Discontinued        3.375 g 12.5 mL/hr over 240 Minutes Intravenous Every 8 hours 12/19/23 2333 12/20/23 0730          Medications  apixaban   5 mg Oral BID   insulin  aspart  0-5 Units Subcutaneous QHS   insulin  aspart  0-6 Units Subcutaneous TID WC   lidocaine   1 patch Transdermal Q24H   metoprolol  tartrate  12.5 mg Oral BID   pantoprazole  (PROTONIX ) IV  40 mg Intravenous Q12H   rosuvastatin   20 mg Oral Daily   sodium chloride  flush  3 mL Intravenous Q12H      Subjective:   [name] was seen and examined today.  No acute complaints, feeling better, tolerating full liquid diet, no nausea vomiting abdominal pain.     Objective:   Vitals:   12/26/23 0011 12/26/23 0350 12/26/23 0744 12/26/23 1236  BP: (!) 116/49 (!) 129/58 (!) 139/57 116/66  Pulse: 67 72 71 72  Resp: 16 18 16 16   Temp: 98.6 F (37 C) 98.5 F (36.9 C) 98.3 F (36.8 C) 98.5 F (36.9 C)  TempSrc: Axillary Oral Oral Oral  SpO2: 93% 93% 96% 96%  Weight:      Height:        Intake/Output Summary (Last 24 hours) at 12/26/2023 1411 Last data filed at 12/25/2023 1817 Gross per 24 hour  Intake 420 ml  Output 800 ml  Net -380 ml     Wt Readings from Last 3 Encounters:  12/17/23 73.5 kg  12/20/22 76.2 kg  11/16/22 77.7 kg    Physical Exam General: Alert and oriented x 3, NAD Cardiovascular: S1 S2 clear, RRR.  Respiratory: CTAB Gastrointestinal: Soft, nontender, nondistended, NBS Ext: no pedal edema bilaterally Neuro: no new deficits Psych: Normal affect     Data Reviewed:  I have personally reviewed following labs    CBC Lab Results  Component Value Date   WBC 9.3 12/26/2023   RBC 3.06 (L) 12/26/2023   HGB 9.9 (L) 12/26/2023   HCT 30.6 (L) 12/26/2023   MCV 100.0 12/26/2023   MCH 32.4 12/26/2023   PLT 230 12/26/2023   MCHC 32.4 12/26/2023    RDW 14.5 12/26/2023   LYMPHSABS 0.7 12/16/2023   MONOABS 1.1 (H) 12/16/2023   EOSABS 0.0 12/16/2023   BASOSABS 0.0 12/16/2023     Last metabolic panel Lab Results  Component Value Date   NA 138 12/26/2023   K 4.3 12/26/2023   CL 107 12/26/2023   CO2 23 12/26/2023   BUN <5 (L) 12/26/2023   CREATININE 0.50 12/26/2023  GLUCOSE 95 12/26/2023   GFRNONAA >60 12/26/2023   GFRAA >60 05/04/2016   CALCIUM  7.6 (L) 12/26/2023   PHOS 2.7 12/26/2023   PROT 6.4 (L) 12/19/2023   ALBUMIN  2.1 (L) 12/26/2023   BILITOT 0.8 12/19/2023   ALKPHOS 19 (L) 12/19/2023   AST 28 12/19/2023   ALT 16 12/19/2023   ANIONGAP 8 12/26/2023    CBG (last 3)  Recent Labs    12/25/23 2123 12/26/23 0613 12/26/23 1235  GLUCAP 126* 92 116*      Coagulation Profile: No results for input(s): "INR", "PROTIME" in the last 168 hours.   Radiology Studies: I have personally reviewed the imaging studies  No results found.      Bertram Brocks M.D. [facility] 12/26/2023, 2:11 PM  Available via Epic secure chat 7am-7pm After 7 pm, please refer to night coverage provider listed on amion.

## 2023-12-26 NOTE — Progress Notes (Signed)
 Physical Therapy Treatment Patient Details Name: NYIMA VANACKER MRN: [id number] DOB: 1941/01/29 Today's Date: 12/26/2023   History of Present Illness 83 y.o. female presents to [facility] hospital on 12/16/2023 after a syncopal episode and fall. After the fall pt was noted to have slurred speech and facial droop. MRI notable for bilateral parietal infarcts. PMH includes HTN, HLD, GERD, uterine CA, PE, DVT.    PT Comments  Pt is progressing towards goals. Currently pt is Mod I for bed mobility, sit to stand and gait with RW. Pt continues with pain in the chest. Due to pt current functional status, home set up and available assistance at home recommending skilled physical therapy services 3x/week in order to address strength, balance and functional mobility to decrease risk for falls, injury and re-hospitalization.       If plan is discharge home, recommend the following: A little help with walking and/or transfers;Assistance with cooking/housework;Assist for transportation;Help with stairs or ramp for entrance     Equipment Recommendations  None recommended by PT       Precautions / Restrictions Precautions Precautions: Fall Recall of Precautions/Restrictions: Intact Precaution/Restrictions Comments: reports chest pain from fall but states its improved from initial pain Restrictions Weight Bearing Restrictions Per Provider Order: No     Mobility  Bed Mobility Overal bed mobility: Modified Independent Bed Mobility: Supine to Sit     Supine to sit: Modified independent (Device/Increase time), Used rails     General bed mobility comments: pt left in recliner on departure.    Transfers Overall transfer level: Modified independent Equipment used: Rolling walker (2 wheels) Transfers: Sit to/from Stand, Bed to chair/wheelchair/BSC Sit to Stand: Modified independent (Device/Increase time)   Step pivot transfers: Modified independent (Device/Increase time)       General transfer comment:  Pt ambulated to bathroom with RW without assistance. Pt performed sit to stand 3x during session 1x from EOB, 1x from toilet and 1x from recliner.    Ambulation/Gait Ambulation/Gait assistance: Modified independent (Device/Increase time) Gait Distance (Feet): 20 Feet (+15) Assistive device: Rolling walker (2 wheels) Gait Pattern/deviations: Step-through pattern, Decreased step length - left, Decreased step length - right Gait velocity: decreased Gait velocity interpretation: 1.31 - 2.62 ft/sec, indicative of limited community ambulator   General Gait Details: Mod I with RW pt was able to ambulate to the rest room gait WNL slightly decreased.    Modified Rankin (Stroke Patients Only) Modified Rankin (Stroke Patients Only) Pre-Morbid Rankin Score: No symptoms Modified Rankin: Slight disability     Balance Overall balance assessment: Mild deficits observed, not formally tested, Modified Independent Sitting-balance support: No upper extremity supported, Feet supported Sitting balance-Leahy Scale: Good     Standing balance support: No upper extremity supported, During functional activity Standing balance-Leahy Scale: Fair Standing balance comment: no overt LOB. Pt standing to get assistance with pericare with and without UE without LOB      Communication Communication Communication: No apparent difficulties  Cognition   Behavior During Therapy: WFL for tasks assessed/performed, Anxious   PT - Cognitive impairments: No apparent impairments, No family/caregiver present to determine baseline     Following commands: Intact      Cueing Cueing Techniques: Verbal cues     General Comments General comments (skin integrity, edema, etc.): Pt vital signs stable throughout session. Pt reporting improved pain from initial evaluation. Upon arrival Purewick in place sucking and pt had loose BM that was getting sucked into pure wick residual; RN was notified. Pt was assisted getting  cleaned  up. Pt has redness swelling noted at the labia majoria and minora/nursing notified. Pt reports this is tender as well.      Pertinent Vitals/Pain Pain Assessment Pain Assessment: Faces Faces Pain Scale: Hurts little more Pain Location: chest Pain Descriptors / Indicators: Discomfort, Sore Pain Intervention(s): Monitored during session, Limited activity within patient's tolerance     PT Goals (current goals can now be found in the care plan section) Acute Rehab PT Goals Patient Stated Goal: to reduce pain at chest when mobilizing PT Goal Formulation: With patient Time For Goal Achievement: 12/31/23 Potential to Achieve Goals: Good Progress towards PT goals: Progressing toward goals    Frequency    Min 3X/week      PT Plan  Continue with current POC        AM-PAC PT "6 Clicks" Mobility   Outcome Measure  Help needed turning from your back to your side while in a flat bed without using bedrails?: None Help needed moving from lying on your back to sitting on the side of a flat bed without using bedrails?: None Help needed moving to and from a bed to a chair (including a wheelchair)?: None Help needed standing up from a chair using your arms (e.g., wheelchair or bedside chair)?: None Help needed to walk in hospital room?: None Help needed climbing 3-5 steps with a railing? : A Little 6 Click Score: 23    End of Session Equipment Utilized During Treatment: Gait belt Activity Tolerance: Patient tolerated treatment well Patient left: with call bell/phone within reach;in chair;with chair alarm set Nurse Communication: Mobility status PT Visit Diagnosis: Other abnormalities of gait and mobility (R26.89);Muscle weakness (generalized) (M62.81);Pain     Time: 1250-1314 PT Time Calculation (min) (ACUTE ONLY): 24 min  Charges:    $Therapeutic Activity: 23-37 mins PT General Charges $$ ACUTE PT VISIT: 1 Visit                     Sloan Duncans, DPT, CLT  Acute  Rehabilitation Services Office: [phone] (Secure chat preferred)    Jenice Mitts 12/26/2023, 1:45 PM

## 2023-12-26 NOTE — TOC Progression Note (Signed)
 Transition of Care [facility]) - Progression Note    Patient Details  Name: [name] MRN: [id number] Date of Birth: 1941/01/19  Transition of Care [facility] CM/SW Contact  Jonathan Neighbor, RN Phone Number: 12/26/2023, 2:23 PM  Clinical Narrative:     Pt was denied by [facility] Berthold for [facility] rehab. Plan is for home with home health at d/c.  Choice provided and [facility] decided on. Information on the AVS. Gasper Karst will contact her for the first home visit. Pt has needed DME at home: walker/ [facility] TOC following.  Expected Discharge Plan: Home w Home Health Services Barriers to Discharge: Continued Medical Work up  Expected Discharge Plan and Services   Discharge Planning Services: CM Consult Post Acute Care Choice: Home Health Living arrangements for the past 2 months: Single Family Home                           HH Arranged: PT, OT, Nurse's Aide HH Agency: [facility] Date [facility] Agency Contacted: 12/26/23   Representative spoke with at [facility] Agency: Randel Buss   Social Determinants of Health (SDOH) Interventions SDOH Screenings   Food Insecurity: No Food Insecurity (12/17/2023)  Housing: Low Risk  (12/17/2023)  Transportation Needs: No Transportation Needs (12/17/2023)  Utilities: Not At Risk (12/17/2023)  Social Connections: Moderately Integrated (12/17/2023)  Tobacco Use: Low Risk  (12/23/2023)    Readmission Risk Interventions     No data to display

## 2023-12-26 NOTE — Progress Notes (Signed)
 4/24 Recommending intermittent level therapy and nursing, Appeal number: [id number]

## 2023-12-27 ENCOUNTER — Other Ambulatory Visit (HOSPITAL_COMMUNITY): Payer: Self-pay

## 2023-12-27 ENCOUNTER — Telehealth (HOSPITAL_COMMUNITY): Payer: Self-pay | Admitting: Pharmacy Technician

## 2023-12-27 DIAGNOSIS — R29818 Other symptoms and signs involving the nervous system: Secondary | ICD-10-CM | POA: Diagnosis not present

## 2023-12-27 DIAGNOSIS — W19XXXA Unspecified fall, initial encounter: Secondary | ICD-10-CM | POA: Diagnosis not present

## 2023-12-27 DIAGNOSIS — Z7901 Long term (current) use of anticoagulants: Secondary | ICD-10-CM | POA: Diagnosis not present

## 2023-12-27 DIAGNOSIS — R0789 Other chest pain: Secondary | ICD-10-CM | POA: Diagnosis not present

## 2023-12-27 LAB — GLUCOSE, CAPILLARY
Glucose-Capillary: 114 mg/dL — ABNORMAL HIGH (ref 70–99)
Glucose-Capillary: 116 mg/dL — ABNORMAL HIGH (ref 70–99)

## 2023-12-27 LAB — RENAL FUNCTION PANEL
Albumin: 2.1 g/dL — ABNORMAL LOW (ref 3.5–5.0)
Anion gap: 8 (ref 5–15)
BUN: 7 mg/dL — ABNORMAL LOW (ref 8–23)
CO2: 26 mmol/L (ref 22–32)
Calcium: 8 mg/dL — ABNORMAL LOW (ref 8.9–10.3)
Chloride: 104 mmol/L (ref 98–111)
Creatinine, Ser: 0.7 mg/dL (ref 0.44–1.00)
GFR, Estimated: >60 mL/min (ref >60)
Glucose, Bld: 107 mg/dL — ABNORMAL HIGH (ref 70–99)
Phosphorus: 3 mg/dL (ref 2.5–4.6)
Potassium: 4.2 mmol/L (ref 3.5–5.1)
Sodium: 138 mmol/L (ref 135–145)

## 2023-12-27 LAB — CBC
HCT: 30.5 % — ABNORMAL LOW (ref 36.0–46.0)
Hemoglobin: 10 g/dL — ABNORMAL LOW (ref 12.0–15.0)
MCH: 32.4 pg (ref 26.0–34.0)
MCHC: 32.8 g/dL (ref 30.0–36.0)
MCV: 98.7 fL (ref 80.0–100.0)
Platelets: 243 10*3/uL (ref 150–400)
RBC: 3.09 MIL/uL — ABNORMAL LOW (ref 3.87–5.11)
RDW: 14.7 % (ref 11.5–15.5)
WBC: 13.2 10*3/uL — ABNORMAL HIGH (ref 4.0–10.5)
nRBC: 0 % (ref 0.0–0.2)

## 2023-12-27 MED ORDER — PANTOPRAZOLE SODIUM 40 MG PO TBEC
40.0000 mg | DELAYED_RELEASE_TABLET | Freq: Two times a day (BID) | ORAL | 3 refills | Status: AC
  Filled 2023-12-27: qty 60, 30d supply, fill #0

## 2023-12-27 MED ORDER — APIXABAN 5 MG PO TABS
5.0000 mg | ORAL_TABLET | Freq: Two times a day (BID) | ORAL | 3 refills | Status: DC
Start: 2023-12-27 — End: 2024-01-03
  Filled 2023-12-27: qty 60, 30d supply, fill #0

## 2023-12-27 MED ORDER — PROPRANOLOL HCL ER 60 MG PO CP24: 60.0000 mg | ORAL_CAPSULE | Freq: Every day | ORAL | Status: AC

## 2023-12-27 MED ORDER — LIDOCAINE 5 % EX PTCH
1.0000 | MEDICATED_PATCH | CUTANEOUS | 0 refills | Status: AC
  Filled 2023-12-27: qty 30, 30d supply, fill #0

## 2023-12-27 MED ORDER — ENSURE ENLIVE PO LIQD
237.0000 mL | Freq: Two times a day (BID) | ORAL | Status: DC
  Administered 2023-12-27: 237 mL via ORAL

## 2023-12-27 NOTE — Progress Notes (Signed)
 Physical Therapy Treatment Patient Details Name: [name] MRN: [id number] DOB: 10-20-1940 Today's Date: 12/27/2023   History of Present Illness 83 y.o. female presents to [facility] hospital on 12/16/2023 after a syncopal episode and fall. After the fall pt was noted to have slurred speech and facial droop. MRI notable for bilateral parietal infarcts. PMH includes HTN, HLD, GERD, uterine CA, PE, DVT.    PT Comments  Pt is continuing to progress with goals. Pt continues to report pain in the chest and was educated on positioning in hospital bed and recliner and the importance of chest/upper back mobility to help decrease pain in the chest; pt reports decrease in pain after education and exercises. Pt currently is mod I for sit to stand from varying surfaces (except 1x Min A from low toilet), gait and stairs per home set up. Pt has a very high fear of falling; educated on home safety techniques. Due to pt current functional status, home set up and available assistance at home recommending skilled physical therapy services 3x/week in order to address strength, balance and functional mobility to decrease risk for falls, injury and re-hospitalization.       If plan is discharge home, recommend the following: Other (comment) (as needed)     Equipment Recommendations  None recommended by PT       Precautions / Restrictions Precautions Precautions: Fall Recall of Precautions/Restrictions: Intact Precaution/Restrictions Comments: reports chest pain from fall but states its improved from initial pain Restrictions Weight Bearing Restrictions Per Provider Order: No     Mobility  Bed Mobility     General bed mobility comments: Pt in recliner on arrival and departure.    Transfers Overall transfer level: Modified independent Equipment used: Rolling walker (2 wheels) Transfers: Sit to/from Stand, Bed to chair/wheelchair/BSC Sit to Stand: Modified independent (Device/Increase time)   Step pivot  transfers: Modified independent (Device/Increase time)       General transfer comment: pt with increased chest/back pain. Pt Mod I from bed and recliner. Requires Min A boost from low toilet. Recommend BSC which pt has at home in storage.    Ambulation/Gait Ambulation/Gait assistance: Modified independent (Device/Increase time) Gait Distance (Feet): 150 Feet (+20 ft 2x) Assistive device: Rolling walker (2 wheels) Gait Pattern/deviations: Step-through pattern, Decreased step length - left, Decreased step length - right Gait velocity: decreased Gait velocity interpretation: 1.31 - 2.62 ft/sec, indicative of limited community ambulator   General Gait Details: Mod I with RW pt was able to ambulate to the rest room gait WNL slightly decreased; then ambulated to gym to do stairs then the stair well   Stairs Stairs: Yes Stairs assistance: Modified independent (Device/Increase time) Stair Management: One rail Right, Step to pattern, Forwards, Sideways Number of Stairs: 4 (+2 , 2 and 1) General stair comments: pt educated on how to use one rail wtih both hands for side ways ascending/descending stairs for safety in order to make pt feel more secure and safe due to very high fear of falling.   Modified Rankin (Stroke Patients Only) Modified Rankin (Stroke Patients Only) Pre-Morbid Rankin Score: No symptoms Modified Rankin: Slight disability     Balance Overall balance assessment: Modified Independent Sitting-balance support: No upper extremity supported, Feet supported Sitting balance-Leahy Scale: Good     Standing balance support: Single extremity supported, Reliant on assistive device for balance, During functional activity Standing balance-Leahy Scale: Fair Standing balance comment: no overt LOB      Communication Communication Communication: No apparent difficulties  Cognition  Arousal: Alert Behavior During Therapy: WFL for tasks assessed/performed, Anxious   PT - Cognitive  impairments: No apparent impairments, No family/caregiver present to determine baseline       Following commands: Intact      Cueing Cueing Techniques: Verbal cues     General Comments General comments (skin integrity, edema, etc.): Pt educated on the importance of mobility in order to decrease pain in the chest. Pt has been sitting in a reclined bed and recliner since injury causing tightness in the chest. Pt was taught shoulder rolls forward/back and scapular retractions which pt states helps pain. Pt has a very high fear of falling and continues to be nervous about falling at home. Discussed home safety techniques      Pertinent Vitals/Pain Pain Assessment Pain Score: 5  Pain Descriptors / Indicators: Discomfort, Grimacing, Moaning, Sore Pain Intervention(s): Monitored during session     PT Goals (current goals can now be found in the care plan section) Acute Rehab PT Goals Patient Stated Goal: to reduce pain at chest when mobilizing PT Goal Formulation: With patient Time For Goal Achievement: 12/31/23 Potential to Achieve Goals: Good Progress towards PT goals: Progressing toward goals    Frequency    Min 3X/week      PT Plan  Continue with current POC        AM-PAC PT "6 Clicks" Mobility   Outcome Measure  Help needed turning from your back to your side while in a flat bed without using bedrails?: None Help needed moving from lying on your back to sitting on the side of a flat bed without using bedrails?: None Help needed moving to and from a bed to a chair (including a wheelchair)?: None Help needed standing up from a chair using your arms (e.g., wheelchair or bedside chair)?: None Help needed to walk in hospital room?: None Help needed climbing 3-5 steps with a railing? : None 6 Click Score: 24    End of Session Equipment Utilized During Treatment: Gait belt Activity Tolerance: Patient tolerated treatment well Patient left: with call bell/phone within  reach;in chair;with chair alarm set Nurse Communication: Mobility status PT Visit Diagnosis: Other abnormalities of gait and mobility (R26.89);Muscle weakness (generalized) (M62.81);Pain Pain - part of body:  (chest at sternum rib attachments)     Time: 4098-1191 PT Time Calculation (min) (ACUTE ONLY): 26 min  Charges:    $Gait Training: 8-22 mins $Therapeutic Activity: 8-22 mins PT General Charges $$ ACUTE PT VISIT: 1 Visit                     Sloan Duncans, DPT, CLT  Acute Rehabilitation Services Office: [phone] (Secure chat preferred)    Jenice Mitts 12/27/2023, 1:10 PM

## 2023-12-27 NOTE — Progress Notes (Signed)
Walker delivered to bedside.

## 2023-12-27 NOTE — Discharge Summary (Addendum)
 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 female with medical history significant of hypertension, hyperlipidemia, GERD, uterine cancer, PE, DVT presenting with focal neurologic deficits.    Patient previously medically stable for discharge, awaiting SNF authorization and bed approval.  Unfortunately on the 17th patient had notably worsening nausea vomiting found to have acute onset ileus vs SBO.     Assessment and Plan:  Gastric pneumatosis - CT 4/18 showed gastric distention with pneumatosis of the proximal stomach wall, associated PEG and small area of contained extraluminal air at the GE junction - General Surgery was consulted, gastrografin  swallow negative for leak, - Previous hospitalist, Dr Rudine Cos discussed with GI, no indication for EGD/intervention at this time.  Antibiotics were discontinued on 4/21. - continue protonix  40mg  BID  - Improved, tolerating liquid diet, diet advanced to soft solids today     Small bowel obstruction CT 4/18 showed SBO with dilated proximal and mid small bowel with slightly decompressed distal small bowel.  Patient has prior history of  hysterectomy for endometrial CA.  No peritonitis on exam. -Surgery following, no current indications for emergency surgery.  Contrast in colon on x-ray, patient has tolerated liquid diet - Diet advanced to soft solids, tolerating      Acute CVA, acute punctate bilateral parietal cortical infarcts, POA Dysphagia, resolving - Facial droop and slurred speech at intake, resolved - Image confirms acute punctate infarcts, neurology consulted - recommend Eliquis  5 bid  -Echo: EF 60-65%; grade 1 diastolic dysfunction, without obvious shunt -resumed eliquis  5 mg twice daily on 4/24, Hb stable 10.0   Diarrhea, resolved Leukocytosis, lactic acidosis resolved - Unspecified source for diarrhea, resolved - Cdiff negative   Hypokalemia -Likely due to GI losses, replaced   Syncope, likely vagal Orthostatic hypotension  - Resolved after IVF - Echocardiogram with EF 60 to 65% normal LV function with grade 1 diastolic dysfunction, without notable shunt   Hypertension, currently hypotensive - propranolol  and furosemide  were placed on hold - BP now improving, resume propranolol       Hyperlipidemia - Continue rosuvastatin    History of PE and DVT - Eliquis  was placed on hold and neurology recommended new dose of 5 mg twice daily in the setting of acute CVA once tolerating diet  - Resumed Eliquis  5 mg twice daily today   History of uterine cancer - Noted, outpatient follow-up     Estimated body mass index is 27.81 kg/m as calculated from the following:   Height as of this encounter: 5\' 4"  (1.626 m).   Weight as of this encounter: 73.5 kg.   Insurance denied SNF, patient will be discharged home with home health PT OT, RN, aide     Pain control - [place]  Controlled Substance Reporting System  database was reviewed. and patient was instructed, not to drive, operate heavy machinery, perform activities at heights, swimming or participation in water activities or provide baby-sitting  services while on Pain, Sleep and Anxiety Medications; until their outpatient Physician has advised to do so again. Also recommended to not to take more than prescribed Pain, Sleep and Anxiety Medications.  Consultants: Surgery, neurology Procedures performed:   Disposition: Home Diet recommendation:  Discharge Diet Orders (From admission, onward)     Start     Ordered   12/27/23 0000  Diet - low sodium heart healthy        12/27/23 1104            DISCHARGE MEDICATION: Allergies as of 12/27/2023   No Known Allergies      Medication List     PAUSE taking these medications    furosemide  40 MG tablet Wait to take this until your doctor or other care provider tells you to start again. Commonly known as: LASIX  Take 1 tablet (40 mg total) by mouth as needed.   potassium chloride  SA 20 MEQ tablet Wait to take this until your doctor or other care provider tells you to start again. Commonly known as: KLOR-CON  M Take 20 mEq by mouth 2 (two) times daily.       TAKE these medications    acetaminophen  500 MG tablet Commonly known as: TYLENOL  Take 500-1,000 mg by mouth every 8 (eight) hours as needed for mild pain or moderate pain.   apixaban  5 MG Tabs tablet Commonly known as: Eliquis  Take 1 tablet (5 mg total) by mouth 2 (two) times daily. What changed:  medication strength how much to take   ascorbic acid  500 MG tablet Commonly known as: VITAMIN C  Take 500 mg by mouth daily.   CALTRATE 600 PO Take 1 tablet by mouth in the morning and at bedtime.   cholecalciferol  1000 units tablet Commonly known as: VITAMIN D  Take 1,000 Units by mouth daily.   Crestor  40 MG tablet Generic drug: rosuvastatin  20 mg.   cyanocobalamin 1000 MCG tablet Commonly known as: VITAMIN B12 Take 1,000 mcg by mouth daily.   DAILY VALUE MULTIVITAMIN PO Take 1 tablet by mouth daily.   denosumab  60 MG/ML Sosy injection Commonly known as: PROLIA  Inject 60 mg into the skin every 6 (six)  months.   ferrous sulfate 325 (65 FE) MG EC tablet Take 325 mg by mouth 3 (three) times daily with meals.   lidocaine  5 % Commonly known as: LIDODERM  Place 1 patch onto the skin daily. Remove & Discard patch within 12 hours or as directed by MD.  Apply to chest as needed Apply to intact skin to cover the most painful area. Start taking on: December 28, 2023   pantoprazole  40 MG tablet Commonly known as: PROTONIX  Take 1 tablet (40 mg total) by mouth 2 (two) times daily.   propranolol  ER 60 MG 24 hr capsule Commonly known as: INDERAL  LA Take 1 capsule (60 mg total) by mouth daily. Start taking on: December 28, 2023   psyllium 58.6 % powder Commonly known as: METAMUCIL Take 1 packet by mouth daily as needed (constipation).               Durable Medical Equipment  (From admission, onward)           Start     Ordered   12/27/23 0949  For home use only DME Walker rolling  Once  Question Answer Comment  Walker: With 5 Inch Wheels   Patient needs a walker to treat with the following condition Weakness      12/27/23 0948            Contact information for follow-up providers     [facility]. Schedule an appointment as soon as possible for a visit in 1 month(s).   Specialty: Neurology Why: stroke clinic Contact information: [address] [place]  [address] [phone]        Care, [facility] Follow up.   Specialty: Home Health Services Why: Gasper Karst will contact you for the first home visit Contact information: [address] [place] [address] [phone]         Windell Hasty, DO. Schedule an appointment as soon as possible for a visit in 2 week(s).   Specialty: Internal Medicine Why: for hospital follow-up Contact information: [address] [place] [address] [phone]              Contact information for after-discharge care     Destination      [facility] [place] Preferred SNF .   Service: Skilled Nursing Contact information: [address] [place]  [phone]                    Discharge Exam: Cleavon Curls Weights   12/17/23 0416  Weight: 73.5 kg   S: Sitting up in the chair, tolerating diet, no acute complaints, disappointed that insurance declined SNF  BP 114/63 (BP Location: Right Arm)   Pulse 77   Temp 98.6 F (37 C) (Oral)   Resp 17   Ht 5\' 4"  (1.626 m)   Wt 73.5 kg   SpO2 96%   BMI 27.81 kg/m   Physical Exam General: Alert and oriented x 3, NAD Cardiovascular: S1 S2 clear, RRR.  Respiratory: CTAB, no wheezing Gastrointestinal: Soft, nontender, nondistended, NBS Ext: no pedal edema bilaterally Neuro: no new deficits Psych: Normal affect    Condition at discharge: fair  The results of significant diagnostics from this hospitalization (including imaging, microbiology, ancillary and laboratory) are listed below for reference.   Imaging Studies: DG ESOPHAGUS W SINGLE CM (SOL OR THIN BA) Result Date: 12/24/2023 CLINICAL DATA:  83 year old female admitted one week ago with small bowel obstruction with CT evidence of gastric pneumatosis, small volume extraluminal air at the gastroesophageal junction. Barium swallow requested. EXAM: ESOPHAGUS/BARIUM SWALLOW/TABLET STUDY TECHNIQUE: Single contrast examination was performed using Omnipaque  300. This exam was performed by Quintin Buckle PA-C, and was supervised and interpreted by Marland Silvas, MD. FLUOROSCOPY: Radiation Exposure Index (as provided by the fluoroscopic device): 22.0 mGy Kerma COMPARISON:  CT ABDOMEN PEVIS 12/20/23 FINDINGS: Patient able to swallow 100 mL of Omnipaque  300 without issue. Esophagus intact, normal contour and course. Contrast empties into the stomach. No evidence of contrast extravasation. IMPRESSION: No evidence of contrast extravasation on barium swallow. Electronically Signed   By: Nicoletta Barrier M.D.   On: 12/24/2023 07:08   DG Abd Portable 1V-Small Bowel Obstruction Protocol-initial, 8 hr delay Result Date: 12/22/2023 CLINICAL DATA:  Fall small-bowel obstruction. EXAM: PORTABLE ABDOMEN - 1 VIEW COMPARISON:  Portable abdomen film yesterday at 8:18 a.m. FINDINGS: 7:46 p.m. NGT remains in place with tip in the body of stomach. There is contrast within the nondilated colon through to the rectum, contrast within scattered nondilated small bowel segments. No dilated small bowel is seen  at this time. There is no supine evidence of free air. There are cholecystectomy clips and old multilevel lumbar spine fusion construct. There are small bilateral pleural effusions with consolidation or atelectasis in the left greater than right lung bases. Osteopenia and advanced degenerative change lower thoracic spine. IMPRESSION: 1. Contrast within the nondilated colon through to the rectum, contrast within scattered nondilated small bowel segments. No dilated small bowel is seen at this time. 2. NGT tip in the body of stomach. 3. Small bilateral pleural effusions with consolidation or atelectasis in the left greater than right lung bases. Electronically Signed   By: Denman Fischer M.D.   On: 12/22/2023 23:40   DG Abd Portable 1V Result Date: 12/21/2023 CLINICAL DATA:  83 year old female with abdominal distension. EXAM: PORTABLE ABDOMEN - 1 VIEW COMPARISON:  Portable 12/20/2023 exam and earlier. FINDINGS: Portable AP supine view at 0818 hours. Satisfactory enteric tube placement with side hole projecting at the level of the gastric body. Stable gastric contour. Non obstructed bowel gas pattern. Stable cholecystectomy clips. Chronic lumbar fusion, postoperative changes. No acute osseous abnormality identified. IMPRESSION: Satisfactory enteric tube position and nonobstructed bowel-gas pattern. Electronically Signed   By: Marlise Simpers M.D.   On: 12/21/2023 12:13   DG Abd 1 View Result Date: 12/20/2023 CLINICAL DATA:   Abdominal distension. EXAM: ABDOMEN - 1 VIEW COMPARISON:  12/20/2023, earlier same day FINDINGS: Interval decrease in marked distention of the stomach. Diffuse gaseous distention of small bowel and colon persists. Contrast material in the bladder is compatible with recent CT imaging. Bones are diffusely demineralized. IMPRESSION: 1. Interval decrease in marked distention of the stomach. 2. Persistent diffuse gaseous distention of small bowel and colon. Electronically Signed   By: Donnal Fusi M.D.   On: 12/20/2023 07:44   CT ABDOMEN PELVIS W CONTRAST Result Date: 12/20/2023 CLINICAL DATA:  Nausea, vomiting EXAM: CT ABDOMEN AND PELVIS WITH CONTRAST TECHNIQUE: Multidetector CT imaging of the abdomen and pelvis was performed using the standard protocol following bolus administration of intravenous contrast. RADIATION DOSE REDUCTION: This exam was performed according to the departmental dose-optimization program which includes automated exposure control, adjustment of the mA and/or kV according to patient size and/or use of iterative reconstruction technique. CONTRAST:  75mL OMNIPAQUE  IOHEXOL  350 MG/ML SOLN COMPARISON:  10/04/2022. FINDINGS: Lower chest: Small bilateral pleural effusions. Dependent atelectasis in the lower lobes. Hepatobiliary: Prior cholecystectomy. No focal liver lesion. There is portal venous gas throughout the liver. Pancreas: No focal abnormality or ductal dilatation. Spleen: No focal abnormality.  Normal size. Adrenals/Urinary Tract: No adrenal abnormality. No focal renal abnormality. No stones or hydronephrosis. Urinary bladder is unremarkable. Stomach/Bowel: NG tube tip is in the fundus of the stomach. Significant gaseous distention of the stomach. There is pneumatosis noted within the wall predominantly seen in the fundus and proximal body. There appears to be a small amount of extraluminal air medial to the stomach near the GE junction (image 23 series 3. There is mild distention of  proximal and mid small bowel loops. Caliber change noted in the right lower pelvis. Distal small bowel loops are slightly decompressed relative to the other small bowel loops. Findings concerning for distal small bowel obstruction. Scattered colonic diverticula. There is a left lateral abdominal wall/flank hernia containing the descending colon. No bowel obstruction at this level. Vascular/Lymphatic: No evidence of aneurysm or adenopathy. Aortic atherosclerosis. Mesenteric vessels appear patent. Reproductive: Prior hysterectomy.  No adnexal masses. Other: Trace free fluid in the pelvis and adjacent to the liver. Musculoskeletal:  No acute bony abnormality. IMPRESSION: Significant gaseous distention of the stomach with pneumatosis noted in the proximal stomach wall. There appears to be a small amount of contained extraluminal air medial to the proximal stomach in the region of the GE junction. Associated portal venous gas. Dilated proximal and mid small bowel with slightly decompressed distal small bowel. Findings concerning for distal small bowel obstruction. Aortic atherosclerosis.  Mesenteric vessels appear patent. NG tube in the proximal stomach. Small amount of free fluid. Small bilateral pleural effusions. Dependent atelectasis in the lower lobes. Electronically Signed   By: Janeece Mechanic M.D.   On: 12/20/2023 01:25   DG Abd Portable 1V Result Date: 12/20/2023 CLINICAL DATA:  NG tube placement EXAM: PORTABLE ABDOMEN - 1 VIEW COMPARISON:  12/19/2023 FINDINGS: NG tube tip is in the proximal stomach with the side port near the GE junction. Marked gaseous distention of the stomach. Pneumatosis again noted within the proximal gastric wall. IMPRESSION: NG tube tip in the proximal stomach with the side port near the GE junction. This could be advanced several cm for optimal positioning. Continued gaseous distention of the stomach with pneumatosis. Electronically Signed   By: Janeece Mechanic M.D.   On: 12/20/2023 00:31    DG Abd 1 View Result Date: 12/19/2023 CLINICAL DATA:  Nausea, vomiting EXAM: ABDOMEN - 1 VIEW COMPARISON:  CT 10/04/2022, FINDINGS: There is marked gaseous distension of the stomach with pneumatosis involving the gastric fundus and lateral aspect of the proximal body of the stomach. Multiple prominent gas-filled loops of small bowel are present suggesting a developing ileus. Cholecystectomy clips are seen in the right upper quadrant. No gross free intraperitoneal gas. Small right pleural effusion. No acute bone abnormality. Lumbar fusion with instrumentation noted. IMPRESSION: 1. Marked gaseous distension of the stomach with pneumatosis involving the gastric fundus and lateral aspect of the proximal body of the stomach. Gastric ischemia, while unusual, could appear in this fashion. Further evaluation with contrast enhanced CT imaging is recommended. 2. Developing ileus. Electronically Signed   By: Worthy Heads M.D.   On: 12/19/2023 23:10   ECHOCARDIOGRAM COMPLETE BUBBLE STUDY Result Date: 12/17/2023    ECHOCARDIOGRAM REPORT   Patient Name:   ICELYNN ONKEN Date of Exam: 12/17/2023 Medical Rec #:  [id number]      Height:       64.0 in Accession #:    [id number]     Weight:       162.0 lb Date of Birth:  11-23-1940      BSA:          1.789 m Patient Age:    83 years       BP:           140/51 mmHg Patient Gender: F              HR:           66 bpm. Exam Location:  Inpatient Procedure: 2D Echo (Both Spectral and Color Flow Doppler were utilized during            procedure). Indications:    syncope  History:        Patient has no prior history of Echocardiogram examinations.                 Risk Factors:Hypertension and Dyslipidemia.  Sonographer:    Dione Franks RDCS Referring Phys: [id number] Gayl Katos MELVIN IMPRESSIONS  1. Left ventricular ejection fraction, by estimation, is 60 to 65%. The left ventricle has  normal function. The left ventricle has no regional wall motion abnormalities. Left ventricular  diastolic parameters are consistent with Grade I diastolic dysfunction (impaired relaxation).  2. Right ventricular systolic function is normal. The right ventricular size is normal. There is mildly elevated pulmonary artery systolic pressure. The estimated right ventricular systolic pressure is 37.8 mmHg.  3. The mitral valve is normal in structure. Trivial mitral valve regurgitation. No evidence of mitral stenosis.  4. The aortic valve is normal in structure. Aortic valve regurgitation is not visualized. No aortic stenosis is present.  5. The inferior vena cava is normal in size with greater than 50% respiratory variability, suggesting right atrial pressure of 3 mmHg.  6. Agitated saline contrast bubble study was negative, with no evidence of any interatrial shunt. FINDINGS  Left Ventricle: Left ventricular ejection fraction, by estimation, is 60 to 65%. The left ventricle has normal function. The left ventricle has no regional wall motion abnormalities. The left ventricular internal cavity size was normal in size. There is  no left ventricular hypertrophy. Left ventricular diastolic parameters are consistent with Grade I diastolic dysfunction (impaired relaxation). Right Ventricle: The right ventricular size is normal. No increase in right ventricular wall thickness. Right ventricular systolic function is normal. There is mildly elevated pulmonary artery systolic pressure. The tricuspid regurgitant velocity is 2.95  m/s, and with an assumed right atrial pressure of 3 mmHg, the estimated right ventricular systolic pressure is 37.8 mmHg. Left Atrium: Left atrial size was normal in size. Right Atrium: Right atrial size was normal in size. Pericardium: There is no evidence of pericardial effusion. Mitral Valve: The mitral valve is normal in structure. Trivial mitral valve regurgitation. No evidence of mitral valve stenosis. Tricuspid Valve: The tricuspid valve is normal in structure. Tricuspid valve regurgitation is  trivial. No evidence of tricuspid stenosis. Aortic Valve: The aortic valve is normal in structure. Aortic valve regurgitation is not visualized. No aortic stenosis is present. Pulmonic Valve: The pulmonic valve was normal in structure. Pulmonic valve regurgitation is not visualized. No evidence of pulmonic stenosis. Aorta: The aortic root is normal in size and structure. Venous: The inferior vena cava is normal in size with greater than 50% respiratory variability, suggesting right atrial pressure of 3 mmHg. IAS/Shunts: No atrial level shunt detected by color flow Doppler. Agitated saline contrast was given intravenously to evaluate for intracardiac shunting. Agitated saline contrast bubble study was negative, with no evidence of any interatrial shunt.  LEFT VENTRICLE PLAX 2D LVIDd:         3.60 cm   Diastology LVIDs:         2.90 cm   LV e' medial:    9.36 cm/s LV PW:         1.10 cm   LV E/e' medial:  12.0 LV IVS:        1.10 cm   LV e' lateral:   9.90 cm/s LVOT diam:     2.00 cm   LV E/e' lateral: 11.3 LV SV:         112 LV SV Index:   62 LVOT Area:     3.14 cm  RIGHT VENTRICLE RV Basal diam:  2.20 cm RV S prime:     12.20 cm/s TAPSE (M-mode): 2.4 cm LEFT ATRIUM             Index        RIGHT ATRIUM           Index LA diam:  3.20 cm 1.79 cm/m   RA Area:     11.20 cm LA Vol (A2C):   32.1 ml 17.95 ml/m  RA Volume:   23.00 ml  12.86 ml/m LA Vol (A4C):   26.9 ml 15.04 ml/m LA Biplane Vol: 30.3 ml 16.94 ml/m  AORTIC VALVE LVOT Vmax:   175.00 cm/s LVOT Vmean:  108.000 cm/s LVOT VTI:    0.355 m  AORTA Ao Root diam: 3.00 cm Ao Asc diam:  3.50 cm MITRAL VALVE                TRICUSPID VALVE MV Area (PHT): 3.65 cm     TR Peak grad:   34.8 mmHg MV Decel Time: 208 msec     TR Vmax:        295.00 cm/s MV E velocity: 112.00 cm/s MV A velocity: 118.00 cm/s  SHUNTS MV E/A ratio:  0.95         Systemic VTI:  0.36 m                             Systemic Diam: 2.00 cm Dorothye Gathers MD Electronically signed by Dorothye Gathers  MD Signature Date/Time: 12/17/2023/9:45:15 PM    Final    CT ANGIO HEAD NECK W WO CM Result Date: 12/17/2023 CLINICAL DATA:  Stroke/TIA, determine embolic source EXAM: CT ANGIOGRAPHY HEAD AND NECK WITH AND WITHOUT CONTRAST TECHNIQUE: Multidetector CT imaging of the head and neck was performed using the standard protocol during bolus administration of intravenous contrast. Multiplanar CT image reconstructions and MIPs were obtained to evaluate the vascular anatomy. Carotid stenosis measurements (when applicable) are obtained utilizing NASCET criteria, using the distal internal carotid diameter as the denominator. RADIATION DOSE REDUCTION: This exam was performed according to the departmental dose-optimization program which includes automated exposure control, adjustment of the mA and/or kV according to patient size and/or use of iterative reconstruction technique. CONTRAST:  75mL OMNIPAQUE  IOHEXOL  350 MG/ML SOLN COMPARISON:  MRI head and CTA head April 14, 25. FINDINGS: CT HEAD FINDINGS Brain: Punctate infarcts better seen on recent MRI. No acute hemorrhage, hydrocephalus, extra-axial collection or mass lesion/mass effect. Patchy white matter hypodensities, nonspecific but compatible with chronic microvascular ischemic disease. Vascular: See below. Skull: No acute fracture. Sinuses/Orbits: No acute findings. Review of the MIP images confirms the above findings CTA NECK FINDINGS Aortic arch: Incompletely imaged. The visualized great vessel origins are patent. Right carotid system: No evidence of dissection, stenosis (50% or greater), or occlusion. Left carotid system: No evidence of dissection, stenosis (50% or greater), or occlusion. Vertebral arteries: Codominant. No evidence of dissection, stenosis (50% or greater), or occlusion. Mild irregularity the distal right V2 vertebral artery. Skeleton: No evidence of acute fracture. Multilevel degenerative change. Other neck: No evidence of acute abnormality on limited  assessment. Upper chest: Visualized lung apices are clear. Review of the MIP images confirms the above findings CTA HEAD FINDINGS Anterior circulation: Bilateral intracranial ICAs, MCAs, and ACAs are patent without proximal hemodynamically significant stenosis. Posterior circulation: Bilateral intradural vertebral arteries, basilar artery and bilateral posterior cerebral arteries are patent without proximal hemodynamically significant stenosis. Venous sinuses: As permitted by contrast timing, patent. Review of the MIP images confirms the above findings IMPRESSION: No large vessel occlusion or proximal hemodynamically significant stenosis. Electronically Signed   By: Stevenson Elbe M.D.   On: 12/17/2023 19:25   MR BRAIN WO CONTRAST Result Date: 12/16/2023 CLINICAL DATA:  Neuro deficit, acute, stroke suspected.  EXAM: MRI HEAD WITHOUT CONTRAST TECHNIQUE: Multiplanar, multiecho pulse sequences of the brain and surrounding structures were obtained without intravenous contrast. COMPARISON:  Head CT 12/16/2023 FINDINGS: Brain: There are single punctate acute cortical infarcts in both parietal lobes. No intracranial hemorrhage, mass, midline shift, or extra-axial fluid collection is identified. T2 hyperintensities in the cerebral white matter bilaterally are nonspecific but compatible with mild chronic small vessel ischemic disease. There is mild-to-moderate cerebral atrophy. Vascular: Major intracranial vascular flow voids are preserved. Skull and upper cervical spine: Unremarkable bone marrow signal. Sinuses/Orbits: Bilateral cataract extraction. Mild mucosal thickening in the right maxillary sinus. No significant mastoid fluid. Other: None. IMPRESSION: 1. Punctate acute bilateral parietal cortical infarcts. 2. Mild chronic small vessel ischemic disease and cerebral atrophy. Electronically Signed   By: Aundra Lee M.D.   On: 12/16/2023 21:07   DG Chest 2 View Result Date: 12/16/2023 CLINICAL DATA:  Chest pain.  EXAM: CHEST - 2 VIEW COMPARISON:  06/27/2017. FINDINGS: Low lung volume. Bilateral lung fields are clear. No acute consolidation or lung collapse. There is mild prominence of interstitial markings, which is nonspecific. No frank pulmonary edema. Bilateral costophrenic angles are clear. Normal cardio-mediastinal silhouette. No acute osseous abnormalities. The soft tissues are within normal limits. IMPRESSION: No active cardiopulmonary disease. Electronically Signed   By: Beula Brunswick M.D.   On: 12/16/2023 17:03   CT HEAD WO CONTRAST Result Date: 12/16/2023 CLINICAL DATA:  Neuro deficit, concern for stroke. Dizziness, slurred speech, facial asymmetry. EXAM: CT HEAD WITHOUT CONTRAST TECHNIQUE: Contiguous axial images were obtained from the base of the skull through the vertex without intravenous contrast. RADIATION DOSE REDUCTION: This exam was performed according to the departmental dose-optimization program which includes automated exposure control, adjustment of the mA and/or kV according to patient size and/or use of iterative reconstruction technique. COMPARISON:  None Available. FINDINGS: Brain: No acute intracranial hemorrhage. No CT evidence of acute infarct. Nonspecific hypoattenuation in the periventricular and subcortical white matter favored to reflect chronic microvascular ischemic changes. 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 No edema, mass effect, or midline shift. The basilar cisterns are patent. Ventricles: Prominence of the ventricles suggesting underlying parenchymal volume loss. Vascular: Atherosclerotic calcifications of the carotid siphons. No hyperdense vessel. Skull: No acute or aggressive finding. Orbits: Orbits are symmetric. Sinuses: Mucosal thickening in the right maxillary sinus with findings suggestive of mucoperiosteal reaction. 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 Other: Mastoid air cells are clear. IMPRESSION: No CT  evidence of acute intracranial abnormality. Mild chronic microvascular ischemic changes and mild parenchymal volume loss. 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 Electronically Signed   By: Denny Flack M.D.   On: 12/16/2023 15:54    Microbiology: Results for orders placed or performed during the hospital encounter of 12/16/23  C Difficile Quick Screen w PCR reflex     Status: None   Collection Time: 12/18/23  8:20 PM   Specimen: STOOL  Result Value Ref Range Status   C Diff antigen NEGATIVE NEGATIVE Final   C Diff toxin NEGATIVE NEGATIVE Final   C Diff interpretation No C. difficile detected.  Final    Comment: Performed at [facility], [address] [place], [place] [address]  Gastrointestinal Panel by PCR , Stool     Status: None   Collection Time: 12/18/23  8:20 PM   Specimen: Stool  Result Value Ref Range Status   Campylobacter species NOT  DETECTED NOT DETECTED Final   Plesimonas shigelloides NOT DETECTED NOT DETECTED Final   Salmonella species NOT DETECTED NOT DETECTED Final   Yersinia enterocolitica NOT DETECTED NOT DETECTED Final   Vibrio species NOT DETECTED NOT DETECTED Final   Vibrio cholerae NOT DETECTED NOT DETECTED Final   Enteroaggregative E coli (EAEC) NOT DETECTED NOT DETECTED Final   Enteropathogenic E coli (EPEC) NOT DETECTED NOT DETECTED Final   Enterotoxigenic E coli (ETEC) NOT DETECTED NOT DETECTED Final   Shiga like toxin producing E coli (STEC) NOT DETECTED NOT DETECTED Final   Shigella/Enteroinvasive E coli (EIEC) NOT DETECTED NOT DETECTED Final   Cryptosporidium NOT DETECTED NOT DETECTED Final   Cyclospora cayetanensis NOT DETECTED NOT DETECTED Final   Entamoeba histolytica NOT DETECTED NOT DETECTED Final   Giardia lamblia NOT DETECTED NOT DETECTED Final   Adenovirus F40/41 NOT DETECTED NOT DETECTED Final   Astrovirus NOT DETECTED NOT DETECTED Final   Norovirus GI/GII NOT DETECTED NOT DETECTED Final    Rotavirus A NOT DETECTED NOT DETECTED Final   Sapovirus (I, II, IV, and V) NOT DETECTED NOT DETECTED Final    Comment: Performed at [facility], [address] [place], [place] [address]    Labs: CBC: Recent Labs  Lab 12/23/23 0612 12/24/23 0552 12/25/23 0601 12/26/23 0538 12/27/23 0648  WBC 10.1 10.7* 8.9 9.3 13.2*  HGB 10.5* 9.7* 10.3* 9.9* 10.0*  HCT 32.3* 29.3* 31.6* 30.6* 30.5*  MCV 97.9 98.3 98.4 100.0 98.7  PLT 254 259 246 230 243   Basic Metabolic Panel: Recent Labs  Lab 12/23/23 0612 12/24/23 0552 12/25/23 0601 12/26/23 0538 12/27/23 0648  NA 145 140 138 138 138  K 2.9* 3.2* 3.8 4.3 4.2  CL 102 103 103 107 104  CO2 28 27 26 23 26   GLUCOSE 80 97 90 95 107*  BUN 14 11 7* <5* 7*  CREATININE 0.74 0.64 0.56 0.50 0.70  CALCIUM  8.0* 7.4* 7.5* 7.6* 8.0*  PHOS  --   --   --  2.7 3.0   Liver Function Tests: Recent Labs  Lab 12/26/23 0538 12/27/23 0648  ALBUMIN  2.1* 2.1*   CBG: Recent Labs  Lab 12/26/23 1235 12/26/23 1642 12/26/23 2125 12/27/23 0604 12/27/23 1200  GLUCAP 116* 153* 112* 114* 116*    Discharge time spent: greater than 30 minutes.  Signed: Bertram Brocks, MD [facility] 12/27/2023

## 2023-12-27 NOTE — Progress Notes (Signed)
 Occupational Therapy Treatment Patient Details Name: TERREA BRUSTER MRN: [id number] DOB: February 27, 1941 Today's Date: 12/27/2023   History of present illness 83 y.o. female presents to [facility] hospital on 12/16/2023 after a syncopal episode and fall. After the fall pt was noted to have slurred speech and facial droop. MRI notable for bilateral parietal infarcts. PMH includes HTN, HLD, GERD, uterine CA, PE, DVT.   OT comments  Pt received in supine and agreeable to therapy. Pt completing bed mobility to get EOB with mod I. Pt reports feeling better than when initially hospitalized however was very limited by pain this session. Pt ambulating with close guard for safety. Able to complete oral care in standing but having to sit d/t pain after ~2-3 minutes. Other self care completed while seated and deferred other tasks d/t pt pain. Pt returned to recliner and left with hot pack for pain and RN notified. Acute OT to continue to follow to address established goals to facilitate DC to next venue of care.        If plan is discharge home, recommend the following:  A little help with walking and/or transfers;A little help with bathing/dressing/bathroom;Assistance with cooking/housework;Assist for transportation;Help with stairs or ramp for entrance   Equipment Recommendations  Tub/shower seat    Recommendations for Other Services      Precautions / Restrictions Precautions Precautions: Fall Recall of Precautions/Restrictions: Intact Restrictions Weight Bearing Restrictions Per Provider Order: No       Mobility Bed Mobility Overal bed mobility: Modified Independent Bed Mobility: Supine to Sit       Sit to supine: Modified independent (Device/Increase time), Used rails   General bed mobility comments: pt able to get EOB and left in recliner EOS    Transfers Overall transfer level: Needs assistance Equipment used: Rolling walker (2 wheels) Transfers: Sit to/from Stand Sit to Stand: Contact  guard assist           General transfer comment: pt with increased chest/back pain, CGA for safety     Balance Overall balance assessment: Modified Independent Sitting-balance support: No upper extremity supported, Feet supported Sitting balance-Leahy Scale: Good     Standing balance support: Single extremity supported, Reliant on assistive device for balance, During functional activity Standing balance-Leahy Scale: Fair Standing balance comment: no overt LOB but pt with unsteady moments in standing, likely secondary to pain and discomfort                           ADL either performed or assessed with clinical judgement   ADL Overall ADL's : Needs assistance/impaired     Grooming: Wash/dry face;Oral care;Brushing hair;Supervision/safety;Sitting;Standing Grooming Details (indicate cue type and reason): pt able to brush teeth in standing at sink with close guard supervision due to slight unsteadiness, pain progressing in standing requiring pt to sit and rest, other self care completed in sitting at sink. Upper Body Bathing: Set up;Sitting Upper Body Bathing Details (indicate cue type and reason): seated at sink with set up provided             Toilet Transfer: Contact guard assist;BSC/3in1;Ambulation;Rolling walker (2 wheels) Toilet Transfer Details (indicate cue type and reason): simualted transfer         Functional mobility during ADLs: Contact guard assist;Supervision/safety General ADL Comments: defered other ADL tasks d/t pain limiting performance    Extremity/Trunk Assessment              Vision  Perception     Praxis     Communication Communication Communication: No apparent difficulties   Cognition Arousal: Alert Behavior During Therapy: WFL for tasks assessed/performed, Anxious Cognition: No apparent impairments             OT - Cognition Comments: pt in great deal of pain this date                 Following  commands: Intact        Cueing   Cueing Techniques: Verbal cues  Exercises      Shoulder Instructions       General Comments VSS on RA, pt reports pain is less severe from initial arrival however was extrememly limited by pain this session    Pertinent Vitals/ Pain       Pain Assessment Pain Assessment: 0-10 Pain Score: 5  Faces Pain Scale: Hurts whole lot Pain Location: chest and reports moving to back Pain Descriptors / Indicators: Discomfort, Grimacing, Moaning, Sore Pain Intervention(s): Monitored during session, Limited activity within patient's tolerance  Home Living                                          Prior Functioning/Environment              Frequency  Min 2X/week        Progress Toward Goals  OT Goals(current goals can now be found in the care plan section)  Progress towards OT goals: Progressing toward goals  Acute Rehab OT Goals Patient Stated Goal: decrease pain, recieve more therapy prior to returning home alone OT Goal Formulation: With patient Time For Goal Achievement: 01/01/24 Potential to Achieve Goals: Good ADL Goals Pt Will Perform Lower Body Bathing: with modified independence;sit to/from stand Pt Will Perform Lower Body Dressing: with modified independence;sit to/from stand Pt Will Transfer to Toilet: with modified independence;ambulating Pt Will Perform Toileting - Clothing Manipulation and hygiene: with modified independence;sit to/from stand Additional ADL Goal #1: Pt will complete 10 minute standing task to simulate IADL activity with VSS  Plan      Co-evaluation                 AM-PAC OT "6 Clicks" Daily Activity     Outcome Measure   Help from another person eating meals?: None Help from another person taking care of personal grooming?: A Little Help from another person toileting, which includes using toliet, bedpan, or urinal?: A Little Help from another person bathing (including washing,  rinsing, drying)?: A Little Help from another person to put on and taking off regular upper body clothing?: A Little Help from another person to put on and taking off regular lower body clothing?: A Lot 6 Click Score: 18    End of Session Equipment Utilized During Treatment: Rolling walker (2 wheels)  OT Visit Diagnosis: Unsteadiness on feet (R26.81);Muscle weakness (generalized) (M62.81);History of falling (Z91.81);Pain Pain - part of body:  (chest and back)   Activity Tolerance Patient limited by pain   Patient Left in chair;with call bell/phone within reach;with chair alarm set   Nurse Communication Mobility status        Time: 0732-0801 OT Time Calculation (min): 29 min  Charges: OT General Charges $OT Visit: 1 Visit OT Treatments $Self Care/Home Management : 23-37 mins  Shima Compere, BS, OTA/S   Adhvik Canady 12/27/2023, 9:29 AM

## 2023-12-27 NOTE — Plan of Care (Signed)
  Problem: Activity: Goal: Risk for activity intolerance will decrease Outcome: Progressing   Problem: Pain Managment: Goal: General experience of comfort will improve and/or be controlled Outcome: Progressing   Problem: Safety: Goal: Ability to remain free from injury will improve Outcome: Progressing   Problem: Skin Integrity: Goal: Risk for impaired skin integrity will decrease Outcome: Progressing

## 2023-12-27 NOTE — Telephone Encounter (Signed)
 Pharmacy Patient Advocate Encounter  Received notification from [facility] that Prior Authorization for Lidocaine  5% patches  has been APPROVED from 12/27/2023 to 09/02/2024   PA #/Case ID/Reference #: [id number]

## 2023-12-27 NOTE — Telephone Encounter (Signed)
 Pharmacy Patient Advocate Encounter   Received notification from Inpatient Request that prior authorization for Lidocaine  5% patches is required/requested.   Insurance verification completed.   The patient is insured through [employer] .   Per test claim: PA required; PA submitted to above mentioned insurance via CoverMyMeds Key/confirmation #/EOC [id number] Status is pending

## 2023-12-27 NOTE — TOC CM/SW Note (Signed)
 Patient requesting walker, entered order and asked MD to sign. Lincoln Renshaw with [employer] for walker

## 2023-12-27 NOTE — Progress Notes (Signed)
 Reviewed AVS, patient expressed understanding of medications, MD follow up reviewed.   Removed IV, Site clean, dry and intact.  Patient states all belongings brought to the hospital at time of admission are accounted for and packed to take home.  Patient informed and expressed understanding up medications from [facility] pharmacy. Patient requested to eat lunch before discharging.

## 2023-12-31 ENCOUNTER — Telehealth: Payer: Self-pay

## 2023-12-31 ENCOUNTER — Ambulatory Visit: Payer: Medicare Other | Admitting: Cardiology

## 2023-12-31 DIAGNOSIS — I5033 Acute on chronic diastolic (congestive) heart failure: Secondary | ICD-10-CM

## 2023-12-31 DIAGNOSIS — I10 Essential (primary) hypertension: Secondary | ICD-10-CM

## 2023-12-31 NOTE — Transitions of Care (Post Inpatient/ED Visit) (Signed)
 Stroke Discharge Follow-up   12/31/2023 Name:  [name] MRN:  [id number] DOB:  July 17, 1941  Subjective: [name] is a 83 y.o. year old female who is a primary care patient of Windell Hasty, DO An Emmi alert was received indicating patient responded to questions: Problems setting up rehab?. I reached out by phone to follow up on the alert and spoke to Patient.  Patient reports that she saw her PCP today. Reports that she has not heard from home health.   Care Management Interventions: (1) Reviewed home health orders, (2) Called [facility] and spoke with Sherline Distel who has confirmed that they have the orders and will call patient today. (3) Called patient back to reports that Gasper Karst would call her later today. (4) confirmed patient has all her medications and is taking her medications as prescribed.   Follow up plan: No further intervention required. Patient denies any other concerns today. Provided my contact information for patient to call me if needed.   Orpha Blade, RN, BSN, CEN [employer]- Transition of Care Team.  [facility] [phone]

## 2024-01-02 ENCOUNTER — Other Ambulatory Visit: Payer: Self-pay | Admitting: Cardiology

## 2024-01-02 DIAGNOSIS — I82401 Acute embolism and thrombosis of unspecified deep veins of right lower extremity: Secondary | ICD-10-CM

## 2024-01-02 NOTE — Telephone Encounter (Signed)
 Prescription refill request for Eliquis  received. Indication: HX dvt Last office visit: 12/20/2022, [name]: 0.70, 12/27/2023 Age: 83 yo  Weight: 73.5 kg    Pt overdue for office visit with cardiologist.Msg sent to schedulers.

## 2024-01-03 NOTE — Telephone Encounter (Signed)
 Pt has scheduled appt with Dr Filiberto Hug on 01/31/24. Refill sent.

## 2024-01-03 NOTE — Telephone Encounter (Signed)
 Called and spoke with pt. Transferred call to scheduling.

## 2024-01-20 ENCOUNTER — Other Ambulatory Visit: Payer: Self-pay | Admitting: Cardiology

## 2024-01-31 ENCOUNTER — Encounter: Payer: Self-pay | Admitting: Cardiology

## 2024-01-31 ENCOUNTER — Ambulatory Visit: Attending: Cardiology | Admitting: Cardiology

## 2024-01-31 VITALS — BP 128/82 | HR 66 | Resp 16 | Ht 64.0 in | Wt 158.0 lb

## 2024-01-31 DIAGNOSIS — I639 Cerebral infarction, unspecified: Secondary | ICD-10-CM | POA: Insufficient documentation

## 2024-01-31 DIAGNOSIS — I10 Essential (primary) hypertension: Secondary | ICD-10-CM

## 2024-01-31 DIAGNOSIS — E782 Mixed hyperlipidemia: Secondary | ICD-10-CM | POA: Diagnosis not present

## 2024-01-31 NOTE — Patient Instructions (Signed)
 Testing/Procedures: Echo  Your physician has requested that you have an echocardiogram. Echocardiography is a painless test that uses sound waves to create images of your heart. It provides your doctor with information about the size and shape of your heart and how well your heart's chambers and valves are working. This procedure takes approximately one hour. There are no restrictions for this procedure. Please do NOT wear cologne, perfume, aftershave, or lotions (deodorant is allowed). Please arrive 15 minutes prior to your appointment time.  Please note: We ask at that you not bring children with you during ultrasound (echo/ vascular) testing. Due to room size and safety concerns, children are not allowed in the ultrasound rooms during exams. Our front office staff cannot provide observation of children in our lobby area while testing is being conducted. An adult accompanying a patient to their appointment will only be allowed in the ultrasound room at the discretion of the ultrasound technician under special circumstances. We apologize for any inconvenience.   Follow-Up: At [facility], you and your health needs are our priority.  As part of our continuing mission to provide you with exceptional heart care, our providers are all part of one team.  This team includes your primary Cardiologist (physician) and Advanced Practice Providers or APPs (Physician Assistants and Nurse Practitioners) who all work together to provide you with the care you need, when you need it.  Your next appointment:   As needed   Provider:   Cody Das, MD

## 2024-01-31 NOTE — Progress Notes (Signed)
  Cardiology Office Note:  .   Date:  01/31/2024  ID:  KIYANA VAZGUEZ, DOB 03/23/1941, MRN [id number] PCP: Windell Hasty, DO  [facility] Providers Cardiologist:  Fransico Ivy, MD PCP: Windell Hasty, DO  Chief Complaint  Patient presents with   Dyspnea on exertion   Follow-up     JARICA PLASS is a 83 y.o. female with h/o endometrial cancer, currently in remission, recurrent history of DVT in 1997 and 2017, history of PE in 2017   History of Present Illness  Patient was hospitalized in 12/2023.  She presented with focal neurological deficits, and was found to have acute punctate bilateral parietal cortical infarcts.  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  These improved with conservative medical management.   Regarding the stroke management, cardioembolic source could not be ruled out.  Given that patient was previously on Eliquis  2.5 mg twice daily, the dose was increased to 5 mg twice daily, which would be the management even if she were to have any A-fib.   Vitals:   01/31/24 1300  BP: 128/82  Pulse: 66  Resp: 16  SpO2: 92%      Review of Systems  Cardiovascular:  Negative for chest pain, dyspnea on exertion, leg swelling, palpitations and syncope.        Studies Reviewed: Aaron Aas        Independently interpreted 12/2023: Chol 93, TG 64, HDL 52, LDL 28 HbA1C 5.7% Hb 10.0 Cr 0.7    Physical Exam Vitals and nursing note reviewed.  Constitutional:      General: She is not in acute distress. Neck:     Vascular: No JVD.  Cardiovascular:     Rate and Rhythm: Normal rate and regular rhythm.     Heart sounds: Normal heart sounds. No murmur heard. Pulmonary:     Effort: Pulmonary effort is normal.     Breath sounds: Normal breath sounds. No wheezing or rales.  Musculoskeletal:     Right lower leg: No edema.     Left lower leg: No edema.      VISIT DIAGNOSES:   ICD-10-CM   1.  Cerebrovascular accident (CVA), unspecified mechanism (HCC)  I63.9 ECHOCARDIOGRAM COMPLETE    2. Mixed hyperlipidemia  E78.2     3. Essential hypertension  I10        MINSA WEDDINGTON is a 83 y.o. 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 Cardioembolic source remains a possible etiology, but management may not change even if she were to have A-fib-as her Eliquis  dose has been increased from 2.5 mg twice daily for history of recurrent DVT/PE to 5 mg twice daily.  Continue same. Will check echocardiogram.   Hypertension: Controlled.   Hyperlipidemia: Lipids very well-controlled continue Crestor     F/u as needed  Signed, Cody Das, MD

## 2024-03-05 ENCOUNTER — Ambulatory Visit (INDEPENDENT_AMBULATORY_CARE_PROVIDER_SITE_OTHER): Admitting: Neurology

## 2024-03-05 ENCOUNTER — Encounter: Payer: Self-pay | Admitting: Neurology

## 2024-03-05 VITALS — BP 153/83 | HR 65 | Resp 14 | Ht 62.0 in | Wt 156.0 lb

## 2024-03-05 DIAGNOSIS — I6389 Other cerebral infarction: Secondary | ICD-10-CM

## 2024-03-05 NOTE — Progress Notes (Signed)
 Chief Complaint  Patient presents with   Hospitalization Follow-up    Rm14, alone, HOSPITAL FU - Saw Dr. Jerri: pt mentioned mobility issues since cva, uses cane      ASSESSMENT AND PLAN  [name] is a 83 y.o. 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  Has complete stroke work up, stable, keep current medications.  Follow-up with primary care physician  DIAGNOSTIC DATA (LABS, IMAGING, TESTING) - I reviewed patient records, labs, notes, testing and imaging myself where available.   MEDICAL HISTORY:  [name] is a 83 year old female, seen in request by her primary care from [facility] Dr. Valentin, [place], for evaluation of stroke, initial evaluation March 05, 2024  History is obtained from the patient and review of electronic medical records. I personally reviewed pertinent available imaging films in PACS.   PMHx of  HTN DVT Chronic migraine Embolic stroke Hx of lumbar decompression surgery Endometrial cancer, s/p total hysterectomy, radiation,   She lives alone, drove herself to clinic today, hospital admission December 16, 2023 for slurred speech, she had multiple episode of severe diarrhea the day of admission, felt to dizziness, eventually passed out in her bathroom, when she came to, she called her friend for help, her friend noticed she had slurred speech, and some facial droop MRI of the brain showed punctuated acute bilateral parietal cortical infarction, 1 right cerebellar small infarction, mild small vessel disease generalized atrophy CT angiogram head and neck showed no acute abnormality, Echocardiogram normal ejection fraction with negative agitated saline study,  LDL 28, A1c of 5.7,   she is already on Eliquis  2.5 twice a day prior to hospital admission for history of DVT, increased to 5  mg twice a day,  She is doing well, back to her baseline now  PHYSICAL EXAM:   Vitals:   03/05/24 0936 03/05/24 0942  BP: (!) 152/88 (!) 153/83  Pulse: 65   Resp: 14   Height: 5' 2 (1.575 m)     Body mass index is 28.9 kg/m.  PHYSICAL EXAMNIATION:  Gen: NAD, conversant, well nourised, well groomed                     Cardiovascular: Regular rate rhythm, no peripheral edema, warm, nontender. Eyes: Conjunctivae clear without exudates or hemorrhage Neck: Supple, no carotid bruits. Pulmonary: Clear to auscultation bilaterally   NEUROLOGICAL EXAM:  MENTAL STATUS: Speech/cognition: Awake, alert, oriented to history taking and casual conversation CRANIAL NERVES: CN II: Visual fields are full to confrontation. Pupils are round equal and briskly reactive to light. CN III, IV, VI: extraocular movement are normal. No ptosis. CN V: Facial sensation is intact to light touch CN VII: Face is symmetric with normal eye closure  CN VIII: Hearing is normal to causal conversation. CN IX, X: Phonation is normal. CN XI: Head turning and shoulder shrug are intact  MOTOR: There is no pronator drift of out-stretched arms. Muscle bulk and tone are normal. Muscle strength is normal.  REFLEXES: Reflexes are 2+ and symmetric at the biceps, triceps, knees, and ankles. Plantar responses are flexor.  SENSORY: Intact to light touch, pinprick and vibratory sensation are intact in fingers and toes.  COORDINATION: There is no trunk or limb dysmetria noted.  GAIT/STANCE: Push-up, kyphosis, mildly unsteady  REVIEW OF SYSTEMS:  Full 14 system  review of systems performed and notable only for as above All other review of systems were negative.   ALLERGIES: No Known Allergies  HOME MEDICATIONS: Current Outpatient Medications  Medication Sig Dispense Refill   acetaminophen  (TYLENOL ) 500 MG tablet Take 500-1,000 mg by mouth every 8 (eight) hours as needed for mild pain or moderate pain.      apixaban  (ELIQUIS ) 5 MG TABS tablet Take 1 tablet (5 mg total) by mouth 2 (two) times daily. 180 tablet 1   Calcium  Carbonate (CALTRATE 600 PO) Take 1 tablet by mouth in the morning and at bedtime.     cholecalciferol  (VITAMIN D ) 1000 UNITS tablet Take 1,000 Units by mouth daily.     CRESTOR  40 MG tablet 20 mg.      cyanocobalamin (VITAMIN B12) 1000 MCG tablet Take 1,000 mcg by mouth daily.     denosumab  (PROLIA ) 60 MG/ML SOSY injection Inject 60 mg into the skin every 6 (six) months.     ferrous sulfate 325 (65 FE) MG EC tablet Take 325 mg by mouth 3 (three) times daily with meals.     furosemide  (LASIX ) 40 MG tablet TAKE 1 TABLET BY MOUTH DAILY AS  NEEDED 90 tablet 0   lidocaine  (LIDODERM ) 5 % Place 1 patch onto the skin daily. Remove & Discard patch within 12 hours or as directed by MD.  Apply to chest as needed Apply to intact skin to cover the most painful area. 30 patch 0   Multiple Vitamin (DAILY VALUE MULTIVITAMIN PO) Take 1 tablet by mouth daily.     pantoprazole  (PROTONIX ) 40 MG tablet Take 1 tablet (40 mg total) by mouth 2 (two) times daily. 60 tablet 3   [Paused] potassium chloride  SA (KLOR-CON  M) 20 MEQ tablet Take 20 mEq by mouth 2 (two) times daily.     propranolol  ER (INDERAL  LA) 60 MG 24 hr capsule Take 1 capsule (60 mg total) by mouth daily.     psyllium (METAMUCIL) 58.6 % powder Take 1 packet by mouth daily as needed (constipation).     vitamin C  (ASCORBIC ACID ) 500 MG tablet Take 500 mg by mouth daily.     No current facility-administered medications for this visit.    PAST MEDICAL HISTORY: Past Medical History:  Diagnosis Date   Anxiety    Arthritis    DVT (deep venous thrombosis) (HCC)    Endometrial cancer (HCC) dx'd 01/2008   recurrences 08/2011; 01/2012; xrt comp 03/2012   GERD (gastroesophageal reflux disease)    Hyperlipidemia    Hypertension    Migraines    Radiation 10/2011   External Beam/Intracavitary   S/P radiation therapy 01/29/12 - 03/10/12   Periaortic  area/ 5600 cgy/ 28 Fractions    PAST SURGICAL HISTORY: Past Surgical History:  Procedure Laterality Date   ANTERIOR LATERAL LUMBAR FUSION 4 LEVELS Left 05/03/2016   Procedure: Lumbar three Corpectomy  with Lumbar one to lumbar five fixation fusion with percutaneous pedicle screws: left hemilaminectomy open reduction fracture fragment;  Surgeon: Morene Hicks Ditty, MD;  Location: [facility];  Service: Neurosurgery;  Laterality: Left;   CHOLECYSTECTOMY     EYE SURGERY     LUMBAR PERCUTANEOUS PEDICLE SCREW 4 LEVEL Bilateral 05/03/2016   Procedure: LUMBAR PERCUTANEOUS PEDICLE SCREW 4 LEVEL;  Surgeon: Morene Hicks Ditty, MD;  Location: [facility];  Service: Neurosurgery;  Laterality: Bilateral;   TOTAL ABDOMINAL HYSTERECTOMY W/ BILATERAL SALPINGOOPHORECTOMY  2009    FAMILY HISTORY: Family History  Problem Relation Age of  Onset   Heart failure Mother    Ulcers Father     SOCIAL HISTORY: Social History   Socioeconomic History   Marital status: Single    Spouse name: Not on file   Number of children: 0   Years of education: Not on file   Highest education level: Not on file  Occupational History   Not on file  Tobacco Use   Smoking status: Never   Smokeless tobacco: Never  Vaping Use   Vaping status: Never Used  Substance and Sexual Activity   Alcohol use: No   Drug use: No   Sexual activity: Never  Other Topics Concern   Not on file  Social History Narrative   Not on file   Social Drivers of Health   Financial Resource Strain: Not on file  Food Insecurity: No Food Insecurity (12/31/2023)   Hunger Vital Sign    Worried About Running Out of Food in the Last Year: Never true    Ran Out of Food in the Last Year: Never true  Transportation Needs: No Transportation Needs (12/31/2023)   PRAPARE - Administrator, Civil Service (Medical): No    Lack of Transportation (Non-Medical): No  Physical Activity: Not on file  Stress: Not on file  Social Connections:  Moderately Integrated (12/17/2023)   Social Connection and Isolation Panel    Frequency of Communication with Friends and Family: More than three times a week    Frequency of Social Gatherings with Friends and Family: Once a week    Attends Religious Services: 1 to 4 times per year    Active Member of [employer] or Organizations: Yes    Attends Banker Meetings: 1 to 4 times per year    Marital Status: Widowed  Intimate Partner Violence: Not At Risk (12/31/2023)   Humiliation, Afraid, Rape, and Kick questionnaire    Fear of Current or Ex-Partner: No    Emotionally Abused: No    Physically Abused: No    Sexually Abused: No      Modena Callander, M.D. Ph.D.  [facility] [address], [address] [place], [place] [address] Ph: [phone] Fax: [phone]  CC:  Jerri Pfeiffer, MD [address] [place],  [place] [address]  Valentin Skates, DO

## 2024-03-11 ENCOUNTER — Telehealth (HOSPITAL_COMMUNITY): Payer: Self-pay | Admitting: Pharmacy Technician

## 2024-03-11 NOTE — Telephone Encounter (Addendum)
 Auth Submission: APPROVED Site of care: Site of care: [facility] Payer: [employer] MEDICARE Medication & CPT/J Code(s) submitted: Prolia  (Denosumab ) [id number] Diagnosis Code: M81.0 Route of submission (phone, fax, portal): portal Phone # Fax # Auth type: Buy/Bill HB Units/visits requested: 60mg  x 2 doses Reference number: [id number] Approval from: 03/11/24 to 03/11/25       Dagoberto Armour, CPhT [facility] [phone]

## 2024-03-18 ENCOUNTER — Other Ambulatory Visit (HOSPITAL_COMMUNITY)

## 2024-03-19 ENCOUNTER — Ambulatory Visit (HOSPITAL_COMMUNITY)
Admission: RE | Admit: 2024-03-19 | Discharge: 2024-03-19 | Disposition: A | Discharge status: Home / Self Care | Source: Ambulatory Visit | Attending: Cardiology | Admitting: Cardiology

## 2024-03-19 DIAGNOSIS — E785 Hyperlipidemia, unspecified: Secondary | ICD-10-CM | POA: Insufficient documentation

## 2024-03-19 DIAGNOSIS — I639 Cerebral infarction, unspecified: Secondary | ICD-10-CM | POA: Diagnosis present

## 2024-03-19 DIAGNOSIS — I349 Nonrheumatic mitral valve disorder, unspecified: Secondary | ICD-10-CM | POA: Diagnosis not present

## 2024-03-19 DIAGNOSIS — I080 Rheumatic disorders of both mitral and aortic valves: Secondary | ICD-10-CM | POA: Diagnosis not present

## 2024-03-19 DIAGNOSIS — I10 Essential (primary) hypertension: Secondary | ICD-10-CM | POA: Insufficient documentation

## 2024-03-19 LAB — ECHOCARDIOGRAM COMPLETE
Area-P 1/2: 2.87 cm2
S' Lateral: 2.8 cm

## 2024-03-21 ENCOUNTER — Ambulatory Visit: Payer: Self-pay | Admitting: Cardiology

## 2024-03-21 NOTE — Progress Notes (Signed)
 Normal pumping function of the heart. No severe heart valve abnormalities noted.  Mildly elevated pressure on the right side of the heart, likely chronic. Continue current medications.  Thanks MJP

## 2024-03-24 ENCOUNTER — Other Ambulatory Visit (HOSPITAL_COMMUNITY): Payer: Self-pay | Admitting: *Deleted

## 2024-03-25 NOTE — Telephone Encounter (Signed)
 Returning call to a nurse.

## 2024-03-25 NOTE — Progress Notes (Signed)
 Attempted to call patient back, but unable to reach her or leave a message. Will try again later.

## 2024-03-26 ENCOUNTER — Ambulatory Visit (HOSPITAL_COMMUNITY)
Admission: RE | Admit: 2024-03-26 | Discharge: 2024-03-26 | Disposition: A | Discharge status: Home / Self Care | Source: Ambulatory Visit | Attending: Internal Medicine | Admitting: Internal Medicine

## 2024-03-26 DIAGNOSIS — M810 Age-related osteoporosis without current pathological fracture: Secondary | ICD-10-CM | POA: Insufficient documentation

## 2024-03-26 MED ORDER — DENOSUMAB 60 MG/ML ~~LOC~~ SOSY
PREFILLED_SYRINGE | SUBCUTANEOUS | Status: AC
  Filled 2024-03-26: qty 1

## 2024-03-26 MED ORDER — DENOSUMAB 60 MG/ML ~~LOC~~ SOSY
60.0000 mg | PREFILLED_SYRINGE | Freq: Once | SUBCUTANEOUS | Status: AC
  Administered 2024-03-26: 60 mg via SUBCUTANEOUS

## 2024-03-27 NOTE — Progress Notes (Signed)
Attempted to call patient, number is busy

## 2024-04-06 NOTE — Progress Notes (Signed)
 Will mail letter to patient to contact office for results.

## 2024-04-07 NOTE — Telephone Encounter (Signed)
 Pt returning call

## 2024-09-10 ENCOUNTER — Other Ambulatory Visit (HOSPITAL_COMMUNITY): Payer: Self-pay | Admitting: Internal Medicine

## 2024-09-10 DIAGNOSIS — M810 Age-related osteoporosis without current pathological fracture: Secondary | ICD-10-CM | POA: Insufficient documentation

## 2024-09-10 NOTE — Progress Notes (Signed)
 Received Prolia  referral. Last dose 03/26/2024. Next doe 09/26/2024  CMP attached with referral. Calcium  wnl on 9.2  Sherry Pennant, PharmD, MPH, BCPS, CPP Clinical Pharmacist

## 2024-09-28 ENCOUNTER — Encounter (HOSPITAL_COMMUNITY)

## 2024-10-07 ENCOUNTER — Inpatient Hospital Stay (HOSPITAL_COMMUNITY)
Admission: RE | Admit: 2024-10-07 | Discharge: 2024-10-07 | Disposition: A | Source: Ambulatory Visit | Attending: Internal Medicine

## 2024-10-07 VITALS — BP 155/78 | HR 71 | Temp 97.0°F | Resp 20 | Ht 63.0 in | Wt 161.0 lb

## 2024-10-07 DIAGNOSIS — M810 Age-related osteoporosis without current pathological fracture: Secondary | ICD-10-CM

## 2024-10-07 DIAGNOSIS — S32001S Stable burst fracture of unspecified lumbar vertebra, sequela: Secondary | ICD-10-CM

## 2024-10-07 MED ORDER — DENOSUMAB 60 MG/ML ~~LOC~~ SOSY
60.0000 mg | PREFILLED_SYRINGE | Freq: Once | SUBCUTANEOUS | Status: AC
  Administered 2024-10-07: 60 mg via SUBCUTANEOUS

## 2024-10-07 MED ORDER — DENOSUMAB 60 MG/ML ~~LOC~~ SOSY
PREFILLED_SYRINGE | SUBCUTANEOUS | Status: AC
  Filled 2024-10-07: qty 1

## 2025-04-07 ENCOUNTER — Encounter (HOSPITAL_COMMUNITY)
